# Patient Record
Sex: Male | Born: 1980 | Race: White | Hispanic: No | Marital: Married | Smoking: Never smoker
Health system: Southern US, Community
[De-identification: ages and names within clinical notes are randomized; demographics above are authoritative.]

## PROBLEM LIST (undated history)

## (undated) DIAGNOSIS — J302 Other seasonal allergic rhinitis: Secondary | ICD-10-CM

## (undated) DIAGNOSIS — I10 Essential (primary) hypertension: Secondary | ICD-10-CM

## (undated) DIAGNOSIS — F909 Attention-deficit hyperactivity disorder, unspecified type: Secondary | ICD-10-CM

## (undated) DIAGNOSIS — K219 Gastro-esophageal reflux disease without esophagitis: Secondary | ICD-10-CM

## (undated) DIAGNOSIS — G4730 Sleep apnea, unspecified: Secondary | ICD-10-CM

## (undated) HISTORY — PX: NO PAST SURGERIES: SHX2092

---

## 2019-08-10 DIAGNOSIS — Z6836 Body mass index (BMI) 36.0-36.9, adult: Secondary | ICD-10-CM | POA: Diagnosis not present

## 2019-08-10 DIAGNOSIS — R072 Precordial pain: Secondary | ICD-10-CM | POA: Diagnosis not present

## 2019-08-10 DIAGNOSIS — R030 Elevated blood-pressure reading, without diagnosis of hypertension: Secondary | ICD-10-CM | POA: Diagnosis not present

## 2019-08-10 DIAGNOSIS — Z23 Encounter for immunization: Secondary | ICD-10-CM | POA: Diagnosis not present

## 2019-08-10 DIAGNOSIS — Z0000 Encounter for general adult medical examination without abnormal findings: Secondary | ICD-10-CM | POA: Diagnosis not present

## 2020-03-12 ENCOUNTER — Other Ambulatory Visit: Payer: Self-pay

## 2020-03-12 NOTE — Telephone Encounter (Signed)
Pt lvm stating he needed a refill but did not state on what medication. Left message to call out office back or to contact his pharmacy so that they can notify us.

## 2020-03-15 ENCOUNTER — Other Ambulatory Visit: Payer: Self-pay

## 2020-03-15 ENCOUNTER — Ambulatory Visit: Payer: BC Managed Care – PPO | Admitting: Family Medicine

## 2020-03-15 ENCOUNTER — Encounter: Payer: Self-pay | Admitting: Family Medicine

## 2020-03-15 VITALS — BP 130/86 | HR 84 | Temp 97.5°F | Resp 18 | Ht 71.0 in | Wt 252.6 lb

## 2020-03-15 DIAGNOSIS — Z202 Contact with and (suspected) exposure to infections with a predominantly sexual mode of transmission: Secondary | ICD-10-CM | POA: Insufficient documentation

## 2020-03-15 MED ORDER — DOXYCYCLINE HYCLATE 100 MG PO TABS: 100.0000 mg | ORAL_TABLET | Freq: Two times a day (BID) | ORAL | 0 refills | Status: DC

## 2020-03-15 NOTE — Progress Notes (Signed)
Acute Office Visit  Subjective:    Patient ID: Brett Roberts, male    DOB: 09-05-1980, 39 y.o.   MRN: [id number]  Cc: pt has concerns for STD exposure  HPI Patient is in today for with h/o wife being diagnosed and treated for Chlamydia. Pt states wife previously was treated for chlamydia and he was tested twice-negative. pts wife was treated with antibiotics and she requested he be treated. Wife was told they could not have intercourse for 3 weeks after both received treatment. Pt states he has not experienced penile discharge, penile lesions, pain with urination or other concerns. pts only sexual partner is his wife.  No past medical history on file. No family history on file.  Social History   Socioeconomic History  . Marital status: Married    Spouse name: Not on file  . Number of children: Not on file  . Years of education: Not on file  . Highest education level: Not on file  Occupational History  . Not on file  Tobacco Use  . Smoking status: Never Smoker  Substance and Sexual Activity  . Alcohol use: Yes    Alcohol/week: 1.0 - 2.0 standard drink    Types: 1 - 2 Cans of beer per week    Comment: twice weekly   . Drug use: Never  . Sexual activity: Yes  Other Topics Concern  . Not on file  Social History Narrative  . Not on file   Social Determinants of Health   Financial Resource Strain:   . Difficulty of Paying Living Expenses:   Food Insecurity:   . Worried About Programme researcher, broadcasting/film/video in the Last Year:   . Barista in the Last Year:   Transportation Needs:   . Freight forwarder (Medical):   Marland Kitchen Lack of Transportation (Non-Medical):   Physical Activity:   . Days of Exercise per Week:   . Minutes of Exercise per Session:   Stress:   . Feeling of Stress :   Social Connections:   . Frequency of Communication with Friends and Family:   . Frequency of Social Gatherings with Friends and Family:   . Attends Religious Services:   . Active Member of  Clubs or Organizations:   . Attends Banker Meetings:   Marland Kitchen Marital Status:   Intimate Partner Violence:   . Fear of Current or Ex-Partner:   . Emotionally Abused:   Marland Kitchen Physically Abused:   . Sexually Abused:     No outpatient medications prior to visit.   No facility-administered medications prior to visit.    No Known Allergies  Review of Systems  Endocrine: Negative for polyuria.  Genitourinary: Negative for discharge, dysuria, flank pain, frequency and genital sores.       Objective:    Physical Exam Constitutional:      Appearance: Normal appearance.  Cardiovascular:     Rate and Rhythm: Normal rate and regular rhythm.     Pulses: Normal pulses.     Heart sounds: Normal heart sounds.  Abdominal:     General: Abdomen is flat.     Palpations: Abdomen is soft.  Neurological:     Mental Status: He is alert.  Psychiatric:        Mood and Affect: Mood normal.        Behavior: Behavior normal.     BP (!) 130/86   Pulse 84   Temp (!) 97.5 F (36.4 C)   Resp  18   Ht 5\' 11"  (1.803 m)   Wt (!) 252 lb 9.6 oz (114.6 kg)   BMI 35.23 kg/m  Wt Readings from Last 3 Encounters:  03/15/20 (!) 252 lb 9.6 oz (114.6 kg)    Health Maintenance Due  Topic Date Due  . Hepatitis C Screening  Never done  . COVID-19 Vaccine (1) Never done  . HIV Screening  Never done  . TETANUS/TDAP  Never done      Assessment & Plan:  1. Exposure to chlamydia-urine sample sent for testing GC/Chl Doxy-rx-d/w pt to complete treatment prior to resuming sexual intercourse Follow-up: if symptoms occur.  An After Visit Summary was printed and given to the patient.  03/17/20 [name] [phone]

## 2020-03-17 LAB — GC/CHLAMYDIA PROBE AMP
Chlamydia trachomatis, NAA: NEGATIVE
Neisseria Gonorrhoeae by PCR: NEGATIVE

## 2020-03-26 ENCOUNTER — Other Ambulatory Visit: Payer: Self-pay

## 2020-03-26 ENCOUNTER — Encounter: Payer: Self-pay | Admitting: Family Medicine

## 2020-03-26 ENCOUNTER — Ambulatory Visit (INDEPENDENT_AMBULATORY_CARE_PROVIDER_SITE_OTHER): Payer: BC Managed Care – PPO | Admitting: Family Medicine

## 2020-03-26 VITALS — BP 130/86 | HR 84 | Temp 97.2°F | Ht 71.0 in | Wt 251.0 lb

## 2020-03-26 DIAGNOSIS — K219 Gastro-esophageal reflux disease without esophagitis: Secondary | ICD-10-CM | POA: Diagnosis not present

## 2020-03-26 DIAGNOSIS — M65331 Trigger finger, right middle finger: Secondary | ICD-10-CM | POA: Diagnosis not present

## 2020-03-26 MED ORDER — TRIAMCINOLONE ACETONIDE 40 MG/ML IJ SUSP: 20.0000 mg | Freq: Once | INTRAMUSCULAR | Status: DC

## 2020-03-26 MED ORDER — OMEPRAZOLE 40 MG PO CPDR: 40.0000 mg | DELAYED_RELEASE_CAPSULE | Freq: Every day | ORAL | 1 refills | Status: DC

## 2020-03-26 NOTE — Patient Instructions (Addendum)
Do not eat 3 hours before going to bed Decrease caffeine intake   Food Choices for Gastroesophageal Reflux Disease, Adult When you have gastroesophageal reflux disease (GERD), the foods you eat and your eating habits are very important. Choosing the right foods can help ease your discomfort. Think about working with a nutrition specialist (dietitian) to help you make good choices. What are tips for following this plan?  Meals  Choose healthy foods that are low in fat, such as fruits, vegetables, whole grains, low-fat dairy products, and lean meat, fish, and poultry.  Eat small meals often instead of 3 large meals a day. Eat your meals slowly, and in a place where you are relaxed. Avoid bending over or lying down until 2-3 hours after eating.  Avoid eating meals 2-3 hours before bed.  Avoid drinking a lot of liquid with meals.  Cook foods using methods other than frying. Bake, grill, or broil food instead.  Avoid or limit: ? Chocolate. ? Peppermint or spearmint. ? Alcohol. ? Pepper. ? Black and decaffeinated coffee. ? Black and decaffeinated tea. ? Bubbly (carbonated) soft drinks. ? Caffeinated energy drinks and soft drinks.  Limit high-fat foods such as: ? Fatty meat or fried foods. ? Whole milk, cream, butter, or ice cream. ? Nuts and nut butters. ? Pastries, donuts, and sweets made with butter or shortening.  Avoid foods that cause symptoms. These foods may be different for everyone. Common foods that cause symptoms include: ? Tomatoes. ? Oranges, lemons, and limes. ? Peppers. ? Spicy food. ? Onions and garlic. ? Vinegar. Lifestyle  Maintain a healthy weight. Ask your doctor what weight is healthy for you. If you need to lose weight, work with your doctor to do so safely.  Exercise for at least 30 minutes for 5 or more days each week, or as told by your doctor.  Wear loose-fitting clothes.  Do not smoke. If you need help quitting, ask your doctor.  Sleep with  the head of your bed higher than your feet. Use a wedge under the mattress or blocks under the bed frame to raise the head of the bed. Summary  When you have gastroesophageal reflux disease (GERD), food and lifestyle choices are very important in easing your symptoms.  Eat small meals often instead of 3 large meals a day. Eat your meals slowly, and in a place where you are relaxed.  Limit high-fat foods such as fatty meat or fried foods.  Avoid bending over or lying down until 2-3 hours after eating.  Avoid peppermint and spearmint, caffeine, alcohol, and chocolate. This information is not intended to replace advice given to you by your health care provider. Make sure you discuss any questions you have with your health care provider. Document Revised: 12/02/2018 Document Reviewed: 09/16/2016 Elsevier Patient Education  2020 [employer].

## 2020-03-26 NOTE — Progress Notes (Signed)
Acute Office Visit  Subjective:    Patient ID: Brett Roberts, male    DOB: 08/10/1981, 39 y.o.   MRN: [id number]  Chief Complaint  Patient presents with  . Abdominal Pain    HPI Patient is in today for upper abdominal pain (sharp, shooting "gasy feeling" that radiates through his chest and down his left arm however, after he belches he feels relieved. Patient has had this issue before and was rx'd dexilant and took it for approx 2-3 weeks. He was unsure if it helped, but it resolved and had not flared up until last week. Pain comes and goes more during the night vs daytime.  Trigger finger 3rd finger of right hand c/o waking up at night with shooting pain down his wrist until he straightens his finger  History reviewed. No pertinent past medical history.  History reviewed. No pertinent surgical history.  Family History  Problem Relation Age of Onset  . Hypertension Mother   . Heart attack Father   . Hypertension Sister   . Hyperlipidemia Brother     Social History   Socioeconomic History  . Marital status: Married    Spouse name: Not on file  . Number of children: Not on file  . Years of education: Not on file  . Highest education level: Not on file  Occupational History  . Not on file  Tobacco Use  . Smoking status: Never Smoker  . Smokeless tobacco: Never Used  Substance and Sexual Activity  . Alcohol use: Yes    Alcohol/week: 1.0 - 2.0 standard drink    Types: 1 - 2 Cans of beer per week    Comment: twice weekly   . Drug use: Never  . Sexual activity: Yes  Other Topics Concern  . Not on file  Social History Narrative  . Not on file   Social Determinants of Health   Financial Resource Strain:   . Difficulty of Paying Living Expenses:   Food Insecurity:   . Worried About Charity fundraiser in the Last Year:   . Arboriculturist in the Last Year:   Transportation Needs:   . Film/video editor (Medical):   Marland Kitchen Lack of Transportation (Non-Medical):     Physical Activity:   . Days of Exercise per Week:   . Minutes of Exercise per Session:   Stress:   . Feeling of Stress :   Social Connections:   . Frequency of Communication with Friends and Family:   . Frequency of Social Gatherings with Friends and Family:   . Attends Religious Services:   . Active Member of Clubs or Organizations:   . Attends Archivist Meetings:   Marland Kitchen Marital Status:   Intimate Partner Violence:   . Fear of Current or Ex-Partner:   . Emotionally Abused:   Marland Kitchen Physically Abused:   . Sexually Abused:     Outpatient Medications Prior to Visit  Medication Sig Dispense Refill  . doxycycline (VIBRA-TABS) 100 MG tablet Take 1 tablet (100 mg total) by mouth 2 (two) times daily. 14 tablet 0   No facility-administered medications prior to visit.    No Known Allergies  Review of Systems  Constitutional: Negative for chills and fever.  HENT: Negative for congestion, ear pain and sore throat.   Respiratory: Negative for shortness of breath.   Cardiovascular: Negative for chest pain.  Gastrointestinal: Positive for abdominal pain. Negative for constipation, diarrhea, nausea and vomiting.  Objective:    Physical Exam Vitals reviewed.  Constitutional:      Appearance: Normal appearance. He is normal weight.  Cardiovascular:     Rate and Rhythm: Normal rate and regular rhythm.  Pulmonary:     Effort: Pulmonary effort is normal.     Breath sounds: Normal breath sounds.  Abdominal:     General: Abdomen is flat. Bowel sounds are normal.     Palpations: Abdomen is soft.     Tenderness: There is abdominal tenderness in the left upper quadrant.  Musculoskeletal:     Comments: Right 3rd digit triggers.   Neurological:     Mental Status: He is alert and oriented to person, place, and time.  Psychiatric:        Mood and Affect: Mood normal.        Behavior: Behavior normal.     BP 130/86   Pulse 84   Temp (!) 97.2 F (36.2 C)   Ht '5\' 11"'   (1.803 m)   Wt 251 lb (113.9 kg)   SpO2 99%   BMI 35.01 kg/m  Wt Readings from Last 3 Encounters:  03/26/20 251 lb (113.9 kg)  03/15/20 (!) 252 lb 9.6 oz (114.6 kg)    Health Maintenance Due  Topic Date Due  . Hepatitis C Screening  Never done  . COVID-19 Vaccine (1) Never done  . HIV Screening  Never done  . TETANUS/TDAP  Never done  . INFLUENZA VACCINE  03/25/2020    There are no preventive care reminders to display for this patient.   No results found for: TSH No results found for: WBC, HGB, HCT, MCV, PLT No results found for: NA, K, CHLORIDE, CO2, GLUCOSE, BUN, CREATININE, BILITOT, ALKPHOS, AST, ALT, PROT, ALBUMIN, CALCIUM, ANIONGAP, EGFR, GFR No results found for: CHOL No results found for: HDL No results found for: LDLCALC No results found for: TRIG No results found for: CHOLHDL No results found for: HGBA1C     Assessment & Plan:  1. Gastroesophageal reflux disease without esophagitis Start omeprazole. Do not eat 3 hours before going to bed Decrease caffeine intake - omeprazole (PRILOSEC) 40 MG capsule; Take 1 capsule (40 mg total) by mouth daily.  Dispense: 30 capsule; Refill: 1  2. Trigger middle finger of right hand - triamcinolone acetonide (KENALOG-40) injection 20 mg Risks were discussed including bleeding, infection, increase in sugars if diabetic, atrophy at site of injection, and increased pain.  After consent was obtained, using sterile technique the area was prepped with alcohol.  Kenalog 20 mg and 0.5 ml plain Lidocaine was then injected and the needle withdrawn.  The procedure was well tolerated.   The patient is asked to continue to rest the joint for a few more days before resuming regular activities.  It may be more painful for the first 1-2 days.  Watch for fever, or increased swelling or persistent pain in the joint. Call or return to clinic prn if such symptoms occur or there is failure to improve as anticipated.  Meds ordered this encounter   Medications  . omeprazole (PRILOSEC) 40 MG capsule    Sig: Take 1 capsule (40 mg total) by mouth daily.    Dispense:  30 capsule    Refill:  1  . triamcinolone acetonide (KENALOG-40) injection 20 mg     Follow-up: Return if symptoms worsen or fail to improve.  An After Visit Summary was printed and given to the patient.  Rochel Brome [facility] [phone]

## 2020-04-01 ENCOUNTER — Encounter: Payer: Self-pay | Admitting: Family Medicine

## 2020-04-21 ENCOUNTER — Encounter (HOSPITAL_COMMUNITY): Payer: Self-pay | Admitting: Emergency Medicine

## 2020-04-21 ENCOUNTER — Emergency Department (HOSPITAL_COMMUNITY)
Admission: EM | Admit: 2020-04-21 | Discharge: 2020-04-21 | Disposition: A | Discharge status: Home / Self Care | Payer: BC Managed Care – PPO | Attending: Emergency Medicine | Admitting: Emergency Medicine

## 2020-04-21 ENCOUNTER — Emergency Department (HOSPITAL_COMMUNITY): Payer: BC Managed Care – PPO

## 2020-04-21 ENCOUNTER — Other Ambulatory Visit: Payer: Self-pay

## 2020-04-21 DIAGNOSIS — R0789 Other chest pain: Secondary | ICD-10-CM | POA: Diagnosis not present

## 2020-04-21 DIAGNOSIS — I2510 Atherosclerotic heart disease of native coronary artery without angina pectoris: Secondary | ICD-10-CM | POA: Diagnosis not present

## 2020-04-21 DIAGNOSIS — R072 Precordial pain: Secondary | ICD-10-CM | POA: Diagnosis not present

## 2020-04-21 DIAGNOSIS — Z79899 Other long term (current) drug therapy: Secondary | ICD-10-CM | POA: Diagnosis not present

## 2020-04-21 HISTORY — DX: Gastro-esophageal reflux disease without esophagitis: K21.9

## 2020-04-21 LAB — CBC
HCT: 42.9 % (ref 39.0–52.0)
Hemoglobin: 13.7 g/dL (ref 13.0–17.0)
MCH: 29 pg (ref 26.0–34.0)
MCHC: 31.9 g/dL (ref 30.0–36.0)
MCV: 90.7 fL (ref 80.0–100.0)
Platelets: 254 10*3/uL (ref 150–400)
RBC: 4.73 MIL/uL (ref 4.22–5.81)
RDW: 13.3 % (ref 11.5–15.5)
WBC: 5.7 10*3/uL (ref 4.0–10.5)
nRBC: 0 % (ref 0.0–0.2)

## 2020-04-21 LAB — BASIC METABOLIC PANEL
Anion gap: 7 (ref 5–15)
BUN: 16 mg/dL (ref 6–20)
CO2: 26 mmol/L (ref 22–32)
Calcium: 8.8 mg/dL — ABNORMAL LOW (ref 8.9–10.3)
Chloride: 105 mmol/L (ref 98–111)
Creatinine, Ser: 1.06 mg/dL (ref 0.61–1.24)
GFR calc Af Amer: >60 mL/min (ref >60)
GFR calc non Af Amer: >60 mL/min (ref >60)
Glucose, Bld: 107 mg/dL — ABNORMAL HIGH (ref 70–99)
Potassium: 4.6 mmol/L (ref 3.5–5.1)
Sodium: 138 mmol/L (ref 135–145)

## 2020-04-21 LAB — TROPONIN I (HIGH SENSITIVITY)
Troponin I (High Sensitivity): 3 ng/L (ref <18)
Troponin I (High Sensitivity): 3 ng/L (ref <18)

## 2020-04-21 MED ORDER — ALUM & MAG HYDROXIDE-SIMETH 200-200-20 MG/5ML PO SUSP
30.0000 mL | Freq: Once | ORAL | Status: AC
  Administered 2020-04-21: 30 mL via ORAL
  Filled 2020-04-21: qty 30

## 2020-04-21 MED ORDER — IBUPROFEN 400 MG PO TABS
400.0000 mg | ORAL_TABLET | ORAL | Status: AC
  Administered 2020-04-21: 400 mg via ORAL
  Filled 2020-04-21: qty 1

## 2020-04-21 MED ORDER — LIDOCAINE VISCOUS HCL 2 % MT SOLN
15.0000 mL | Freq: Once | OROMUCOSAL | Status: AC
  Administered 2020-04-21: 15 mL via ORAL
  Filled 2020-04-21: qty 15

## 2020-04-21 NOTE — ED Triage Notes (Signed)
C/o "discomfort" and "tenderness" to center and L chest since waking up this morning.  Denies SOB, nausea, and vomiting.

## 2020-04-21 NOTE — ED Provider Notes (Signed)
[facility] EMERGENCY DEPARTMENT Provider Note   CSN: [id number] Arrival date & time: 04/21/20  1107     History Chief Complaint  Patient presents with  . Chest Pain    Brett Roberts is a 39 y.o. male medical's history significant for obesity, strong family history for coronary artery disease present today for chest pain.  Patient dates that this is going off and on for as long as a week but has been a little worse this morning which was concerning for him.  Describes chest pain is sharp, left-sided, occasionally radiating into his left shoulder.  Worsened with movement.  No effective exertion or position.  No associated nausea.  Patient has been attempting antacids without improvement.  The history is provided by the patient.  Chest Pain Pain location:  L chest Pain quality: sharp   Pain radiates to:  L shoulder Pain severity:  Moderate Onset quality:  Gradual Duration:  1 day Timing:  Constant Progression:  Waxing and waning Chronicity:  New Context: breathing and movement   Relieved by:  Nothing Associated symptoms: no abdominal pain, no cough, no dizziness, no fever, no headache, no palpitations, no shortness of breath and no vomiting     HPI: A 39 year old patient with a history of obesity presents for evaluation of chest pain. Initial onset of pain was approximately 3-6 hours ago. The patient's chest pain is sharp and is not worse with exertion. The patient's chest pain is middle- or left-sided, is not well-localized, is not described as heaviness/pressure/tightness and does radiate to the arms/jaw/neck. The patient does not complain of nausea and denies diaphoresis. The patient has a family history of coronary artery disease in a first-degree relative with onset less than age 41. The patient has no history of stroke, has no history of peripheral artery disease, has not smoked in the past 90 days, denies any history of treated diabetes, is not hypertensive and  has no history of hypercholesterolemia.   Past Medical History:  Diagnosis Date  . Acid reflux     Patient Active Problem List   Diagnosis Date Noted  . Exposure to chlamydia 03/15/2020    History reviewed. No pertinent surgical history.     Family History  Problem Relation Age of Onset  . Hypertension Mother   . Heart attack Father   . Hypertension Sister   . Hyperlipidemia Brother     Social History   Tobacco Use  . Smoking status: Never Smoker  . Smokeless tobacco: Never Used  Substance Use Topics  . Alcohol use: Yes    Alcohol/week: 1.0 - 2.0 standard drink    Types: 1 - 2 Cans of beer per week    Comment: twice weekly   . Drug use: Never    Home Medications Prior to Admission medications   Medication Sig Start Date End Date Taking? Authorizing Provider  acetaminophen (TYLENOL) 500 MG tablet Take 1,000 mg by mouth every 6 (six) hours as needed for headache (pain).   Yes [provider]  ibuprofen (ADVIL) 200 MG tablet Take 400 mg by mouth every 6 (six) hours as needed for headache (pain).   Yes [provider]  naproxen sodium (ALEVE) 220 MG tablet Take 220 mg by mouth daily as needed (pain/headache).   Yes [provider]  omeprazole (PRILOSEC) 40 MG capsule Take 1 capsule (40 mg total) by mouth daily. 03/26/20  Yes [facility] Mand[facility], MD    Allergies    Patient has no known  allergies.  Review of Systems   Review of Systems  Constitutional: Negative for chills and fever.  HENT: Negative for facial swelling and voice change.   Eyes: Negative for redness and visual disturbance.  Respiratory: Negative for cough and shortness of breath.   Cardiovascular: Positive for chest pain. Negative for palpitations.  Gastrointestinal: Negative for abdominal pain and vomiting.  Genitourinary: Negative for difficulty urinating and dysuria.  Musculoskeletal: Negative for gait problem and joint swelling.  Skin: Negative for rash and wound.    Neurological: Negative for dizziness and headaches.  Psychiatric/Behavioral: Negative for confusion and suicidal ideas.    Physical Exam Updated Vital Signs BP 125/75   Pulse 61   Temp 98.6 F (37 C) (Oral)   Resp 12   Ht 5\' 11"  (1.803 m)   Wt 111.1 kg   SpO2 100%   BMI 34.17 kg/m   Physical Exam Constitutional:      General: He is not in acute distress. HENT:     Head: Normocephalic and atraumatic.     Mouth/Throat:     Mouth: Mucous membranes are moist.     Pharynx: Oropharynx is clear.  Eyes:     General: No scleral icterus.    Pupils: Pupils are equal, round, and reactive to light.  Cardiovascular:     Rate and Rhythm: Normal rate and regular rhythm.     Pulses: Normal pulses.          Carotid pulses are 2+ on the right side and 2+ on the left side.      Radial pulses are 2+ on the right side and 2+ on the left side.       Dorsalis pedis pulses are 2+ on the right side and 2+ on the left side.       Posterior tibial pulses are 2+ on the right side and 2+ on the left side.     Comments: Chest pain reproducible on palpation of the left hemithorax, no appreciable chest wall instability Pulmonary:     Effort: Pulmonary effort is normal. No respiratory distress.  Abdominal:     General: There is no distension.     Tenderness: There is no abdominal tenderness.  Musculoskeletal:        General: No tenderness or deformity.     Cervical back: Normal range of motion and neck supple.  Neurological:     General: No focal deficit present.     Mental Status: He is alert and oriented to person, place, and time.  Psychiatric:        Mood and Affect: Mood normal.        Behavior: Behavior normal.     ED Results / Procedures / Treatments   Labs (all labs ordered are listed, but only abnormal results are displayed) Labs Reviewed  BASIC METABOLIC PANEL - Abnormal; Notable for the following components:      Result Value   Glucose, Bld 107 (*)    Calcium 8.8 (*)    All  other components within normal limits  CBC  TROPONIN I (HIGH SENSITIVITY)  TROPONIN I (HIGH SENSITIVITY)    EKG EKG Interpretation  Date/Time:  Saturday April 21 2020 11:27:43 EDT Ventricular Rate:  62 PR Interval:  226 QRS Duration: 80 QT Interval:  382 QTC Calculation: 387 R Axis:   28 Text Interpretation: Sinus rhythm with 1st degree A-V block Otherwise normal ECG No old tracing to compare Confirmed by 12-31-2000 [phone]) on 04/21/2020 3:43:13 PM  Radiology DG Chest 2 View  Result Date: 04/21/2020 CLINICAL DATA:  Central to LEFT chest discomfort and tenderness since waking this morning EXAM: CHEST - 2 VIEW COMPARISON:  None FINDINGS: Upper normal heart size. Mediastinal contours and pulmonary vascularity normal. Lungs clear. No infiltrate, pleural effusion, or pneumothorax. Osseous structures unremarkable. IMPRESSION: No acute abnormalities. Electronically Signed   By: Ulyses SouthwardMark  Boles M.D.   On: 04/21/2020 12:44    Procedures Procedures (including critical care time)  Medications Ordered in ED Medications  alum & mag hydroxide-simeth (MAALOX/MYLANTA) 200-200-20 MG/5ML suspension 30 mL (30 mLs Oral Given 04/21/20 1645)    And  lidocaine (XYLOCAINE) 2 % viscous mouth solution 15 mL (15 mLs Oral Given 04/21/20 1648)  ibuprofen (ADVIL) tablet 400 mg (400 mg Oral Given 04/21/20 1645)    ED Course  I have reviewed the triage vital signs and the nursing notes.  Pertinent labs & imaging results that were available during my care of the patient were reviewed by me and considered in my medical decision making (see chart for details).  Clinical Course as of Apr 21 1908  Sat Apr 21, 2020  1721 Troponin I (High Sensitivity): 3 [JR]    Clinical Course User Index [JR] Loree Feeedding, Sandra Brents, MD   MDM Rules/Calculators/A&P HEAR Score: 1                        Possible differential diagnoses I considered included:  ACS, PNA, PE, Aortic Dissection, Pancreatitis, Arrhythmia,  Pneumothorax, Endo/Myo/Pericarditis, Shingles, PUD, Emergent complications of an Ulcer, Esophageal pathology, pleuritis, musculoskeletal, and anxiety.    Workup:  EKG findings by my read: Compared to prior: None.  Rate: Sixty-two rhythm: sinus Axis: appropriate  PR: 226 QRS: 80QTc: 387.  First-degree AV block, otherwise no evidence of ischemia or arrhythmia, nor any other pathologic findings concerning considering patient presentation. Findings discussed with attending who agrees.  CXR showed no acute findings.  Initial troponin 3.   HEART pathway: Not excluded HEAR score of one, low risk.  PERC: Zero  Thought process: Unlikely PE, no hypoxia, cough PERC score of zero. Unlikely pneumonia, no cough, no leukocytosis, no fevers, CXR and exam without acute findings. Unlikely pneumothorax, no findings on  CXR. Unlikely pericarditis/myocarditis, does not fit clinical picture, no EKG findings to support. Unlikely dissection, not severe at onset, not sharp or tearing, no radiation to back, no neurologic complaints. Esophageal pathology and anxiety remain diagnoses of exclusion at this time. Due to patient's high risk will attempt to rule out ACS, the low suspicion based on atypical presentation and low here score.   We will administer GI cocktail as well as ibuprofen, suspect musculoskeletal nature of pain given reproducibility on palpation of the chest  Repeat troponin 3, reassuring  After GI cocktail, patient symptomatically improved  The plan for this patient was discussed with Dr. Particia NearingHaviland who voiced agreement and who oversaw evaluation and treatment of this patient.   Counseled follow-up with PCP in the next week and expressed understanding and agreement.  Impression: On re-evaluation patients VS were stable  I discussed all relevant finding and clinical impression to the patient. The patient expressed understanding of their condition as well as the disposition proposed and expressed  agreement.  Clinical Impression: 1. Precordial pain      Final Clinical Impression(s) / ED Diagnoses Final diagnoses:  Precordial pain    Rx / DC Orders ED Discharge Orders    None     Labs, studies and  imaging reviewed by myself and considered in medical decision making if ordered. Imaging interpreted by radiology. Pt was discussed with my attending, Dr. Particia Nearing  Electronically signed by:  Christiane Ha Redding8/28/20217:09 PM       Loree Fee, MD 04/21/20 Robie Ridge, MD 04/21/20 2040

## 2020-08-21 ENCOUNTER — Telehealth (INDEPENDENT_AMBULATORY_CARE_PROVIDER_SITE_OTHER): Payer: BC Managed Care – PPO | Admitting: Nurse Practitioner

## 2020-08-21 ENCOUNTER — Encounter: Payer: Self-pay | Admitting: Nurse Practitioner

## 2020-08-21 VITALS — Ht 71.0 in | Wt 251.0 lb

## 2020-08-21 DIAGNOSIS — R509 Fever, unspecified: Secondary | ICD-10-CM

## 2020-08-21 DIAGNOSIS — J309 Allergic rhinitis, unspecified: Secondary | ICD-10-CM

## 2020-08-21 DIAGNOSIS — Z6835 Body mass index (BMI) 35.0-35.9, adult: Secondary | ICD-10-CM

## 2020-08-21 DIAGNOSIS — J029 Acute pharyngitis, unspecified: Secondary | ICD-10-CM

## 2020-08-21 DIAGNOSIS — J018 Other acute sinusitis: Secondary | ICD-10-CM

## 2020-08-21 LAB — POC COVID19 BINAXNOW: SARS Coronavirus 2 Ag: NEGATIVE

## 2020-08-21 LAB — POCT RAPID STREP A (OFFICE): Rapid Strep A Screen: NEGATIVE

## 2020-08-21 MED ORDER — FLUTICASONE PROPIONATE 50 MCG/ACT NA SUSP: 2.0000 | Freq: Every day | NASAL | 6 refills | Status: DC

## 2020-08-21 MED ORDER — AMOXICILLIN 875 MG PO TABS: 875.0000 mg | ORAL_TABLET | Freq: Two times a day (BID) | ORAL | 0 refills | Status: DC

## 2020-08-21 NOTE — Progress Notes (Signed)
Virtual Visit via Telephone Note   This visit type was conducted due to national recommendations for restrictions regarding the COVID-19 Pandemic (e.g. social distancing) in an effort to limit this patient's exposure and mitigate transmission in our community.  Due to his co-morbid illnesses, this patient is at least at moderate risk for complications without adequate follow up.  This format is felt to be most appropriate for this patient at this time.  The patient did not have access to video technology/had technical difficulties with video requiring transitioning to audio format only (telephone).  All issues noted in this document were discussed and addressed.  No physical exam could be performed with this format.  Patient verbally consented to a telehealth visit.   Date:  08/21/2020   ID:  Brett Roberts, DOB 1981-04-30, MRN [id number]  Patient Location: Home Provider Location: Office/Clinic  PCP:  Blane Ohara, MD   Evaluation Performed:  Established patient, acute telemedicine visit  Chief Complaint: Sinus congestion  History of Present Illness:    Brett Roberts is a 39 y.o. male with 3-day onset of sinus congestion, pressure, pain, fever, and sore throat. Pt states he has bilateral ear fullness, post-nasal-drip and fatigue. Treatment has included Sudafed OTC, Ibuprofen, hot tea, and pushing fluids. He has received COVID-19 vaccinations and booster. He has also had seasonal flu vaccine. Past medical history includes GERD.   The patient does have symptoms concerning for COVID-19 infection (fever, chills, cough, or new shortness of breath).    Past Medical History:  Diagnosis Date  . Acid reflux     History reviewed. No pertinent surgical history.  Family History  Problem Relation Age of Onset  . Hypertension Mother   . Heart attack Father   . Hypertension Sister   . Hyperlipidemia Brother     Social History   Socioeconomic History  . Marital status: Married    Spouse  name: Not on file  . Number of children: Not on file  . Years of education: Not on file  . Highest education level: Not on file  Occupational History  . Not on file  Tobacco Use  . Smoking status: Never Smoker  . Smokeless tobacco: Never Used  Substance and Sexual Activity  . Alcohol use: Yes    Alcohol/week: 1.0 - 2.0 standard drink    Types: 1 - 2 Cans of beer per week    Comment: twice weekly   . Drug use: Never  . Sexual activity: Yes  Other Topics Concern  . Not on file  Social History Narrative  . Not on file   Social Determinants of Health   Financial Resource Strain: Not on file  Food Insecurity: Not on file  Transportation Needs: Not on file  Physical Activity: Not on file  Stress: Not on file  Social Connections: Not on file  Intimate Partner Violence: Not on file    Outpatient Medications Prior to Visit  Medication Sig Dispense Refill  . omeprazole (PRILOSEC) 40 MG capsule Take 1 capsule (40 mg total) by mouth daily. 30 capsule 1  . acetaminophen (TYLENOL) 500 MG tablet Take 1,000 mg by mouth every 6 (six) hours as needed for headache (pain).    Marland Kitchen ibuprofen (ADVIL) 200 MG tablet Take 400 mg by mouth every 6 (six) hours as needed for headache (pain).    . naproxen sodium (ALEVE) 220 MG tablet Take 220 mg by mouth daily as needed (pain/headache).    . triamcinolone acetonide (KENALOG-40) injection 20 mg  No facility-administered medications prior to visit.    Allergies:   Patient has no known allergies.   Social History   Tobacco Use  . Smoking status: Never Smoker  . Smokeless tobacco: Never Used  Substance Use Topics  . Alcohol use: Yes    Alcohol/week: 1.0 - 2.0 standard drink    Types: 1 - 2 Cans of beer per week    Comment: twice weekly   . Drug use: Never     Review of Systems  Constitutional: Negative for chills, fever and malaise/fatigue.  HENT: Positive for congestion, sinus pain and sore throat. Negative for ear pain.   Respiratory:  Negative for cough, sputum production, shortness of breath and wheezing.   Cardiovascular: Negative for chest pain.  Gastrointestinal: Negative for abdominal pain, constipation, diarrhea, nausea and vomiting.  Genitourinary: Negative for frequency and urgency.  Musculoskeletal: Negative for back pain, joint pain and myalgias.  Neurological: Positive for headaches (sinus). Negative for dizziness.     Labs/Other Tests and Data Reviewed:    Recent Labs: 04/21/2020: BUN 16; Creatinine, Ser 1.06; Hemoglobin 13.7; Platelets 254; Potassium 4.6; Sodium 138   Recent Lipid Panel No results found for: CHOL, TRIG, HDL, CHOLHDL, LDLCALC, LDLDIRECT  Wt Readings from Last 3 Encounters:  08/21/20 251 lb (113.9 kg)  04/21/20 245 lb (111.1 kg)  03/26/20 251 lb (113.9 kg)     Objective:    Vital Signs:  Ht 5\' 11"  (1.803 m)   Wt 251 lb (113.9 kg)   BMI 35.01 kg/m    Physical Exam Vitals reviewed.    No physical exam due to telemedicine visit  ASSESSMENT & PLAN:    1. Fever, unspecified fever cause - POC COVID-19 - POCT rapid strep A  2. Sore throat - POC COVID-19 - POCT rapid strep A  3. Acute non-recurrent sinusitis of other sinus - amoxicillin (AMOXIL) 875 MG tablet; Take 1 tablet (875 mg total) by mouth 2 (two) times daily.  Dispense: 20 tablet; Refill: 0  4. Chronic allergic rhinitis - fluticasone (FLONASE) 50 MCG/ACT nasal spray; Place 2 sprays into both nostrils daily.  Dispense: 16 g; Refill: 6  5. Pharyngitis, unspecified etiology - amoxicillin (AMOXIL) 875 MG tablet; Take 1 tablet (875 mg total) by mouth 2 (two) times daily.  Dispense: 20 tablet; Refill: 0  6. BMI 35.0-35.9,adult    COVID-19 Education: The signs and symptoms of COVID-19 were discussed with the patient and how to seek care for testing (follow up with PCP or arrange E-visit). The importance of social distancing was discussed today.   I spent  20 minutes dedicated to the care of this patient on the date  of this encounter to include telephone time with the patient, as well as:EMR review and prescription medication management.  Follow Up:  Virtual Visit  prn  Signed,  02-09-1979, DNP  08/21/2020 15:18    [name]

## 2020-08-23 ENCOUNTER — Ambulatory Visit (INDEPENDENT_AMBULATORY_CARE_PROVIDER_SITE_OTHER): Payer: BC Managed Care – PPO

## 2020-08-23 DIAGNOSIS — Z20822 Contact with and (suspected) exposure to covid-19: Secondary | ICD-10-CM | POA: Diagnosis not present

## 2020-08-23 DIAGNOSIS — Z1152 Encounter for screening for COVID-19: Secondary | ICD-10-CM

## 2020-08-23 LAB — POC COVID19 BINAXNOW: SARS Coronavirus 2 Ag: NEGATIVE

## 2020-08-23 NOTE — Progress Notes (Signed)
      Patient was seen at our office today for a COVID test required for travel. He is negative.  Results for orders placed or performed in visit on 08/23/20 (from the past 24 hour(s))  POC COVID-19     Status: Normal   Collection Time: 08/23/20  9:21 AM  Result Value Ref Range   SARS Coronavirus 2 Ag Negative Negative    Creola Corn, LPN 03/55/97 4:16 AM

## 2020-09-23 ENCOUNTER — Other Ambulatory Visit: Payer: Self-pay | Admitting: Family Medicine

## 2020-09-23 DIAGNOSIS — K219 Gastro-esophageal reflux disease without esophagitis: Secondary | ICD-10-CM

## 2020-10-14 DIAGNOSIS — Z20822 Contact with and (suspected) exposure to covid-19: Secondary | ICD-10-CM | POA: Diagnosis not present

## 2020-11-19 ENCOUNTER — Other Ambulatory Visit: Payer: Self-pay

## 2020-11-19 ENCOUNTER — Encounter: Payer: Self-pay | Admitting: Legal Medicine

## 2020-11-19 ENCOUNTER — Ambulatory Visit: Payer: BC Managed Care – PPO | Admitting: Legal Medicine

## 2020-11-19 DIAGNOSIS — M65331 Trigger finger, right middle finger: Secondary | ICD-10-CM | POA: Diagnosis not present

## 2020-11-19 DIAGNOSIS — M65339 Trigger finger, unspecified middle finger: Secondary | ICD-10-CM

## 2020-11-19 DIAGNOSIS — M6530 Trigger finger, unspecified finger: Secondary | ICD-10-CM | POA: Insufficient documentation

## 2020-11-19 HISTORY — DX: Trigger finger, unspecified middle finger: M65.339

## 2020-11-19 NOTE — Progress Notes (Signed)
Subjective:  Patient ID: Brett Roberts, male    DOB: 10/01/1980  Age: 40 y.o. MRN: [id number]  Chief Complaint  Patient presents with  . trigger finger    R middle finger; States finger locks up and is painful in his palm. States he takes ibuprofen for the pain.    HPI: recurrent right middle finger triggering and at times locked down.  It was injected 6 months ago and did well until recently. He wears a brace at night the the finger becoms trapped in flexion if he doesn't   Current Outpatient Medications on File Prior to Visit  Medication Sig Dispense Refill  . fluticasone (FLONASE) 50 MCG/ACT nasal spray Place 2 sprays into both nostrils daily. 16 g 6  . omeprazole (PRILOSEC) 40 MG capsule Take 1 capsule (40 mg total) by mouth daily. 30 capsule 1   No current facility-administered medications on file prior to visit.   Past Medical History:  Diagnosis Date  . Acid reflux    History reviewed. No pertinent surgical history.  Family History  Problem Relation Age of Onset  . Hypertension Mother   . Heart attack Father   . Hypertension Sister   . Hyperlipidemia Brother    Social History   Socioeconomic History  . Marital status: Married    Spouse name: Not on file  . Number of children: Not on file  . Years of education: Not on file  . Highest education level: Not on file  Occupational History  . Not on file  Tobacco Use  . Smoking status: Never Smoker  . Smokeless tobacco: Never Used  Substance and Sexual Activity  . Alcohol use: Yes    Alcohol/week: 1.0 - 2.0 standard drink    Types: 1 - 2 Cans of beer per week    Comment: twice weekly   . Drug use: Never  . Sexual activity: Yes  Other Topics Concern  . Not on file  Social History Narrative  . Not on file   Social Determinants of Health   Financial Resource Strain: Not on file  Food Insecurity: Not on file  Transportation Needs: Not on file  Physical Activity: Not on file  Stress: Not on file  Social  Connections: Not on file    Review of Systems  Constitutional: Negative for activity change and appetite change.  HENT: Negative for congestion, sinus pressure and sinus pain.   Eyes: Negative for visual disturbance.  Respiratory: Negative for chest tightness and shortness of breath.   Cardiovascular: Negative for chest pain, palpitations and leg swelling.  Gastrointestinal: Negative for abdominal distention and abdominal pain.  Endocrine: Negative for polyuria.  Genitourinary: Negative for dysuria and urgency.  Musculoskeletal: Negative for arthralgias and back pain.  Skin: Negative.   Neurological: Negative.   Psychiatric/Behavioral: Negative.      Objective:  BP 140/88 (BP Location: Left Arm, Patient Position: Sitting, Cuff Size: Normal)   Pulse 92   Temp 97.8 F (36.6 C) (Temporal)   Resp 16   Ht 5\' 11"  (1.803 m)   Wt 267 lb 9.6 oz (121.4 kg)   SpO2 98%   BMI 37.32 kg/m   BP/Weight 11/19/2020 08/21/2020 04/21/2020  Systolic BP 140 - 125  Diastolic BP 88 - 75  Wt. (Lbs) 267.6 251 245  BMI 37.32 35.01 34.17    Physical Exam Vitals reviewed.  Constitutional:      Appearance: Normal appearance.  Cardiovascular:     Rate and Rhythm: Normal rate and regular  rhythm.     Pulses: Normal pulses.     Heart sounds: Normal heart sounds. No murmur heard. No gallop.   Pulmonary:     Effort: Pulmonary effort is normal. No respiratory distress.     Breath sounds: Normal breath sounds. No rales.  Abdominal:     General: Abdomen is flat. Bowel sounds are normal.     Palpations: Abdomen is soft.  Musculoskeletal:        General: Normal range of motion.     Cervical back: Normal range of motion and neck supple.     Comments: Trigger finger right 3rd finger.  Skin:    General: Skin is warm and dry.     Capillary Refill: Capillary refill takes less than 2 seconds.  Neurological:     General: No focal deficit present.     Mental Status: He is alert and oriented to person,  place, and time.       Lab Results  Component Value Date   WBC 5.7 04/21/2020   HGB 13.7 04/21/2020   HCT 42.9 04/21/2020   PLT 254 04/21/2020   GLUCOSE 107 (H) 04/21/2020   NA 138 04/21/2020   K 4.6 04/21/2020   CL 105 04/21/2020   CREATININE 1.06 04/21/2020   BUN 16 04/21/2020   CO2 26 04/21/2020      Assessment & Plan:  Diagnoses and all orders for this visit: Trigger middle finger of right hand Patient has trigger finger right middle finger.  It is painful and can stick in flexion at times.  We discussed injection and possible surgery to correct.  Procedure: Informed consent obtained.  The area was cleaned and dressed.  1% xylocaine 0.5cc and 20mg  kenalog injected into flexor tendon sheath without tcomplication.  No blood loss.  No complications.       I spent 20 minutes dedicated to the care of this patient on the date of this encounter to include face-to-face time with the patient, as well as:   Follow-up: Return if symptoms worsen or fail to improve.  An After Visit Summary was printed and given to the patient.  , MD [name] [phone]

## 2021-01-04 ENCOUNTER — Other Ambulatory Visit: Payer: Self-pay

## 2021-01-04 ENCOUNTER — Ambulatory Visit: Payer: BC Managed Care – PPO | Admitting: Nurse Practitioner

## 2021-01-04 VITALS — BP 130/80 | HR 71 | Temp 97.3°F | Ht 71.0 in | Wt 264.0 lb

## 2021-01-04 DIAGNOSIS — I10 Essential (primary) hypertension: Secondary | ICD-10-CM | POA: Diagnosis not present

## 2021-01-04 DIAGNOSIS — R030 Elevated blood-pressure reading, without diagnosis of hypertension: Secondary | ICD-10-CM | POA: Diagnosis not present

## 2021-01-04 DIAGNOSIS — R079 Chest pain, unspecified: Secondary | ICD-10-CM | POA: Diagnosis not present

## 2021-01-04 DIAGNOSIS — R10816 Epigastric abdominal tenderness: Secondary | ICD-10-CM

## 2021-01-04 DIAGNOSIS — K219 Gastro-esophageal reflux disease without esophagitis: Secondary | ICD-10-CM

## 2021-01-04 DIAGNOSIS — Z6836 Body mass index (BMI) 36.0-36.9, adult: Secondary | ICD-10-CM

## 2021-01-04 DIAGNOSIS — Z8249 Family history of ischemic heart disease and other diseases of the circulatory system: Secondary | ICD-10-CM

## 2021-01-04 MED ORDER — HYDROCHLOROTHIAZIDE 12.5 MG PO TABS: 12.5000 mg | ORAL_TABLET | Freq: Every day | ORAL | 3 refills | Status: DC

## 2021-01-04 MED ORDER — ESOMEPRAZOLE MAGNESIUM 40 MG PO CPDR: 40.0000 mg | DELAYED_RELEASE_CAPSULE | Freq: Two times a day (BID) | ORAL | 3 refills | Status: DC

## 2021-01-04 NOTE — Progress Notes (Signed)
Subjective:  Patient ID: Brett Roberts, male    DOB: [birth year]  Age: 40 y.o. MRN: [id number]  Chief Complaint  Patient presents with  . Hypertension  . Gastroesophageal Reflux    HPI HTN- states he had a CPE on Wednesday of this week and his b/p was elevated.  GERD-sometimes has chest discomfort. Current Outpatient Medications on File Prior to Visit  Medication Sig Dispense Refill  . fluticasone (FLONASE) 50 MCG/ACT nasal spray Place 2 sprays into both nostrils daily. 16 g 6  . omeprazole (PRILOSEC) 40 MG capsule Take 1 capsule (40 mg total) by mouth daily. 30 capsule 1   No current facility-administered medications on file prior to visit.   Past Medical History:  Diagnosis Date  . Acid reflux    No past surgical history on file.  Family History  Problem Relation Age of Onset  . Hypertension Mother   . Heart attack Father   . Hypertension Sister   . Hyperlipidemia Brother    Social History   Socioeconomic History  . Marital status: Married    Spouse name: Not on file  . Number of children: Not on file  . Years of education: Not on file  . Highest education level: Not on file  Occupational History  . Not on file  Tobacco Use  . Smoking status: Never Smoker  . Smokeless tobacco: Never Used  Substance and Sexual Activity  . Alcohol use: Yes    Alcohol/week: 1.0 - 2.0 standard drink    Types: 1 - 2 Cans of beer per week    Comment: twice weekly   . Drug use: Never  . Sexual activity: Yes  Other Topics Concern  . Not on file  Social History Narrative  . Not on file   Social Determinants of Health   Financial Resource Strain: Not on file  Food Insecurity: Not on file  Transportation Needs: Not on file  Physical Activity: Not on file  Stress: Not on file  Social Connections: Not on file    Review of Systems  Constitutional: Negative for chills, diaphoresis, fatigue and fever.  HENT: Negative for congestion, ear pain and sore throat.   Respiratory:  Negative for cough and shortness of breath.   Cardiovascular: Negative for chest pain and leg swelling.  Gastrointestinal: Negative for abdominal pain, constipation, diarrhea, nausea and vomiting.       GERD  Genitourinary: Negative for dysuria and urgency.  Musculoskeletal: Negative for arthralgias and myalgias.  Allergic/Immunologic: Positive for environmental allergies.  Neurological: Negative for dizziness and headaches.  Psychiatric/Behavioral: Negative for dysphoric mood.     Objective:  Pulse 71   Temp (!) 97.3 F (36.3 C)   Ht 5\' 11"  (1.803 m)   Wt 264 lb (119.7 kg)   SpO2 98%   BMI 36.82 kg/m   BP/Weight 01/04/2021 11/19/2020 08/21/2020  Systolic BP - 140 -  Diastolic BP - 88 -  Wt. (Lbs) 264 267.6 251  BMI 36.82 37.32 35.01    Physical Exam Vitals reviewed.  Constitutional:      Appearance: Normal appearance. He is normal weight.  Cardiovascular:     Rate and Rhythm: Normal rate and regular rhythm.     Heart sounds: No murmur heard.   Pulmonary:     Effort: Pulmonary effort is normal.     Breath sounds: Normal breath sounds.  Abdominal:     General: Abdomen is flat. Bowel sounds are normal.     Palpations: Abdomen is soft.  Tenderness: There is no abdominal tenderness.  Neurological:     Mental Status: He is alert and oriented to person, place, and time.  Psychiatric:        Mood and Affect: Mood normal.        Behavior: Behavior normal.     Diabetic Foot Exam - Simple   No data filed      Lab Results  Component Value Date   WBC 5.7 04/21/2020   HGB 13.7 04/21/2020   HCT 42.9 04/21/2020   PLT 254 04/21/2020   GLUCOSE 107 (H) 04/21/2020   NA 138 04/21/2020   K 4.6 04/21/2020   CL 105 04/21/2020   CREATININE 1.06 04/21/2020   BUN 16 04/21/2020   CO2 26 04/21/2020      Assessment & Plan:   There are no diagnoses linked to this encounter.   No orders of the defined types were placed in this encounter.   No orders of the defined  types were placed in this encounter.    Follow-up: No follow-ups on file.  An After Visit Summary was printed and given to the patient.  Janie Morning, NP [name] [phone]    I,Lennex Pietila A Quatavious Rossa,acting as a scribe for Janie Morning, NP.,have documented all relevant documentation on the behalf of Janie Morning, NP,as directed by  Janie Morning, NP while in the presence of Janie Morning, NP.

## 2021-01-04 NOTE — Patient Instructions (Addendum)
Begin HCTZ 12.5 mg daily DASH diet Measure BP at home morning and evening Return in 2 weeks for BP recheck Begin Nexium 40 mg twice daily Discontinue Prilosec Avoid food triggers  Managing Your Hypertension Hypertension, also called high blood pressure, is when the force of the blood pressing against the walls of the arteries is too strong. Arteries are blood vessels that carry blood from your heart throughout your body. Hypertension forces the heart to work harder to pump blood and may cause the arteries to become narrow or stiff. Understanding blood pressure readings Your personal target blood pressure may vary depending on your medical conditions, your age, and other factors. A blood pressure reading includes a higher number over a lower number. Ideally, your blood pressure should be below 120/80. You should know that:  The first, or top, number is called the systolic pressure. It is a measure of the pressure in your arteries as your heart beats.  The second, or bottom number, is called the diastolic pressure. It is a measure of the pressure in your arteries as the heart relaxes. Blood pressure is classified into four stages. Based on your blood pressure reading, your health care provider may use the following stages to determine what type of treatment you need, if any. Systolic pressure and diastolic pressure are measured in a unit called mmHg. Normal  Systolic pressure: below 120.  Diastolic pressure: below 80. Elevated  Systolic pressure: 120-129.  Diastolic pressure: below 80. Hypertension stage 1  Systolic pressure: 130-139.  Diastolic pressure: 80-89. Hypertension stage 2  Systolic pressure: 140 or above.  Diastolic pressure: 90 or above. How can this condition affect me? Managing your hypertension is an important responsibility. Over time, hypertension can damage the arteries and decrease blood flow to important parts of the body, including the brain, heart, and  kidneys. Having untreated or uncontrolled hypertension can lead to:  A heart attack.  A stroke.  A weakened blood vessel (aneurysm).  Heart failure.  Kidney damage.  Eye damage.  Metabolic syndrome.  Memory and concentration problems.  Vascular dementia. What actions can I take to manage this condition? Hypertension can be managed by making lifestyle changes and possibly by taking medicines. Your health care provider will help you make a plan to bring your blood pressure within a normal range. Nutrition  Eat a diet that is high in fiber and potassium, and low in salt (sodium), added sugar, and fat. An example eating plan is called the Dietary Approaches to Stop Hypertension (DASH) diet. To eat this way: ? Eat plenty of fresh fruits and vegetables. Try to fill one-half of your plate at each meal with fruits and vegetables. ? Eat whole grains, such as whole-wheat pasta, brown rice, or whole-grain bread. Fill about one-fourth of your plate with whole grains. ? Eat low-fat dairy products. ? Avoid fatty cuts of meat, processed or cured meats, and poultry with skin. Fill about one-fourth of your plate with lean proteins such as fish, chicken without skin, beans, eggs, and tofu. ? Avoid pre-made and processed foods. These tend to be higher in sodium, added sugar, and fat.  Reduce your daily sodium intake. Most people with hypertension should eat less than 1,500 mg of sodium a day.   Lifestyle  Work with your health care provider to maintain a healthy body weight or to lose weight. Ask what an ideal weight is for you.  Get at least 30 minutes of exercise that causes your heart to beat faster (aerobic exercise)  most days of the week. Activities may include walking, swimming, or biking.  Include exercise to strengthen your muscles (resistance exercise), such as weight lifting, as part of your weekly exercise routine. Try to do these types of exercises for 30 minutes at least 3 days a  week.  Do not use any products that contain nicotine or tobacco, such as cigarettes, e-cigarettes, and chewing tobacco. If you need help quitting, ask your health care provider.  Control any long-term (chronic) conditions you have, such as high cholesterol or diabetes.  Identify your sources of stress and find ways to manage stress. This may include meditation, deep breathing, or making time for fun activities.   Alcohol use  Do not drink alcohol if: ? Your health care provider tells you not to drink. ? You are pregnant, may be pregnant, or are planning to become pregnant.  If you drink alcohol: ? Limit how much you use to:  0-1 drink a day for women.  0-2 drinks a day for men. ? Be aware of how much alcohol is in your drink. In the [place] one drink equals one 12 oz bottle of beer (355 mL), one 5 oz glass of wine (148 mL), or one 1 oz glass of hard liquor (44 mL). Medicines Your health care provider may prescribe medicine if lifestyle changes are not enough to get your blood pressure under control and if:  Your systolic blood pressure is 130 or higher.  Your diastolic blood pressure is 80 or higher. Take medicines only as told by your health care provider. Follow the directions carefully. Blood pressure medicines must be taken as told by your health care provider. The medicine does not work as well when you skip doses. Skipping doses also puts you at risk for problems. Monitoring Before you monitor your blood pressure:  Do not smoke, drink caffeinated beverages, or exercise within 30 minutes before taking a measurement.  Use the bathroom and empty your bladder (urinate).  Sit quietly for at least 5 minutes before taking measurements. Monitor your blood pressure at home as told by your health care provider. To do this:  Sit with your back straight and supported.  Place your feet flat on the floor. Do not cross your legs.  Support your arm on a flat surface, such as a table.  Make sure your upper arm is at heart level.  Each time you measure, take two or three readings one minute apart and record the results. You may also need to have your blood pressure checked regularly by your health care provider.   General information  Talk with your health care provider about your diet, exercise habits, and other lifestyle factors that may be contributing to hypertension.  Review all the medicines you take with your health care provider because there may be side effects or interactions.  Keep all visits as told by your health care provider. Your health care provider can help you create and adjust your plan for managing your high blood pressure. Where to find more information  National Heart, Lung, and Blood Institute: PopSteam.is  American Heart Association: www.heart.org Contact a health care provider if:  You think you are having a reaction to medicines you have taken.  You have repeated (recurrent) headaches.  You feel dizzy.  You have swelling in your ankles.  You have trouble with your vision. Get help right away if:  You develop a severe headache or confusion.  You have unusual weakness or numbness, or you feel faint.  You have severe pain in your chest or abdomen.  You vomit repeatedly.  You have trouble breathing. These symptoms may represent a serious problem that is an emergency. Do not wait to see if the symptoms will go away. Get medical help right away. Call your local emergency services (911 in the [place] Do not drive yourself to the hospital. Summary  Hypertension is when the force of blood pumping through your arteries is too strong. If this condition is not controlled, it may put you at risk for serious complications.  Your personal target blood pressure may vary depending on your medical conditions, your age, and other factors. For most people, a normal blood pressure is less than 120/80.  Hypertension is managed by lifestyle  changes, medicines, or both.  Lifestyle changes to help manage hypertension include losing weight, eating a healthy, low-sodium diet, exercising more, stopping smoking, and limiting alcohol. This information is not intended to replace advice given to you by your health care provider. Make sure you discuss any questions you have with your health care provider. Document Revised: 09/16/2019 Document Reviewed: 07/12/2019 Elsevier Patient Education  2021 Elsevier Inc. [email or web address].[email or web address]">  DASH Eating Plan DASH stands for Dietary Approaches to Stop Hypertension. The DASH eating plan is a healthy eating plan that has been shown to:  Reduce high blood pressure (hypertension).  Reduce your risk for type 2 diabetes, heart disease, and stroke.  Help with weight loss. What are tips for following this plan? Reading food labels  Check food labels for the amount of salt (sodium) per serving. Choose foods with less than 5 percent of the Daily Value of sodium. Generally, foods with less than 300 milligrams (mg) of sodium per serving fit into this eating plan.  To find whole grains, look for the word "whole" as the first word in the ingredient list. Shopping  Buy products labeled as "low-sodium" or "no salt added."  Buy fresh foods. Avoid canned foods and pre-made or frozen meals. Cooking  Avoid adding salt when cooking. Use salt-free seasonings or herbs instead of table salt or sea salt. Check with your health care provider or pharmacist before using salt substitutes.  Do not fry foods. Cook foods using healthy methods such as baking, boiling, grilling, roasting, and broiling instead.  Cook with heart-healthy oils, such as olive, canola, avocado, soybean, or sunflower oil. Meal planning  Eat a balanced diet that includes: ? 4 or more servings of fruits and 4 or more servings of vegetables each day. Try to fill one-half of your plate with fruits and  vegetables. ? 6-8 servings of whole grains each day. ? Less than 6 oz (170 g) of lean meat, poultry, or fish each day. A 3-oz (85-g) serving of meat is about the same size as a deck of cards. One egg equals 1 oz (28 g). ? 2-3 servings of low-fat dairy each day. One serving is 1 cup (237 mL). ? 1 serving of nuts, seeds, or beans 5 times each week. ? 2-3 servings of heart-healthy fats. Healthy fats called omega-3 fatty acids are found in foods such as walnuts, flaxseeds, fortified milks, and eggs. These fats are also found in cold-water fish, such as sardines, salmon, and mackerel.  Limit how much you eat of: ? Canned or prepackaged foods. ? Food that is high in trans fat, such as some fried foods. ? Food that is high in saturated fat, such as fatty meat. ? Desserts and other sweets, sugary drinks, and other foods with added  sugar. ? Full-fat dairy products.  Do not salt foods before eating.  Do not eat more than 4 egg yolks a week.  Try to eat at least 2 vegetarian meals a week.  Eat more home-cooked food and less restaurant, buffet, and fast food.   Lifestyle  When eating at a restaurant, ask that your food be prepared with less salt or no salt, if possible.  If you drink alcohol: ? Limit how much you use to:  0-1 drink a day for women who are not pregnant.  0-2 drinks a day for men. ? Be aware of how much alcohol is in your drink. In the [place] one drink equals one 12 oz bottle of beer (355 mL), one 5 oz glass of wine (148 mL), or one 1 oz glass of hard liquor (44 mL). General information  Avoid eating more than 2,300 mg of salt a day. If you have hypertension, you may need to reduce your sodium intake to 1,500 mg a day.  Work with your health care provider to maintain a healthy body weight or to lose weight. Ask what an ideal weight is for you.  Get at least 30 minutes of exercise that causes your heart to beat faster (aerobic exercise) most days of the week. Activities may  include walking, swimming, or biking.  Work with your health care provider or dietitian to adjust your eating plan to your individual calorie needs. What foods should I eat? Fruits All fresh, dried, or frozen fruit. Canned fruit in natural juice (without added sugar). Vegetables Fresh or frozen vegetables (raw, steamed, roasted, or grilled). Low-sodium or reduced-sodium tomato and vegetable juice. Low-sodium or reduced-sodium tomato sauce and tomato paste. Low-sodium or reduced-sodium canned vegetables. Grains Whole-grain or whole-wheat bread. Whole-grain or whole-wheat pasta. Brown rice. Orpah Cobb. Bulgur. Whole-grain and low-sodium cereals. Pita bread. Low-fat, low-sodium crackers. Whole-wheat flour tortillas. Meats and other proteins Skinless chicken or [place]. Ground chicken or [place]. Pork with fat trimmed off. Fish and seafood. Egg whites. Dried beans, peas, or lentils. Unsalted nuts, nut butters, and seeds. Unsalted canned beans. Lean cuts of beef with fat trimmed off. Low-sodium, lean precooked or cured meat, such as sausages or meat loaves. Dairy Low-fat (1%) or fat-free (skim) milk. Reduced-fat, low-fat, or fat-free cheeses. Nonfat, low-sodium ricotta or cottage cheese. Low-fat or nonfat yogurt. Low-fat, low-sodium cheese. Fats and oils Soft margarine without trans fats. Vegetable oil. Reduced-fat, low-fat, or light mayonnaise and salad dressings (reduced-sodium). Canola, safflower, olive, avocado, soybean, and sunflower oils. Avocado. Seasonings and condiments Herbs. Spices. Seasoning mixes without salt. Other foods Unsalted popcorn and pretzels. Fat-free sweets. The items listed above may not be a complete list of foods and beverages you can eat. Contact a dietitian for more information. What foods should I avoid? Fruits Canned fruit in a light or heavy syrup. Fried fruit. Fruit in cream or butter sauce. Vegetables Creamed or fried vegetables. Vegetables in a cheese sauce.  Regular canned vegetables (not low-sodium or reduced-sodium). Regular canned tomato sauce and paste (not low-sodium or reduced-sodium). Regular tomato and vegetable juice (not low-sodium or reduced-sodium). Rosita Fire. Olives. Grains Baked goods made with fat, such as croissants, muffins, or some breads. Dry pasta or rice meal packs. Meats and other proteins Fatty cuts of meat. Ribs. Fried meat. Tomasa Blase. Bologna, salami, and other precooked or cured meats, such as sausages or meat loaves. Fat from the back of a pig (fatback). Bratwurst. Salted nuts and seeds. Canned beans with added salt. Canned or smoked fish. Whole  eggs or egg yolks. Chicken or [place] with skin. Dairy Whole or 2% milk, cream, and half-and-half. Whole or full-fat cream cheese. Whole-fat or sweetened yogurt. Full-fat cheese. Nondairy creamers. Whipped toppings. Processed cheese and cheese spreads. Fats and oils Butter. Stick margarine. Lard. Shortening. Ghee. Bacon fat. Tropical oils, such as coconut, palm kernel, or palm oil. Seasonings and condiments Onion salt, garlic salt, seasoned salt, table salt, and sea salt. Worcestershire sauce. Tartar sauce. Barbecue sauce. Teriyaki sauce. Soy sauce, including reduced-sodium. Steak sauce. Canned and packaged gravies. Fish sauce. Oyster sauce. Cocktail sauce. Store-bought horseradish. Ketchup. Mustard. Meat flavorings and tenderizers. Bouillon cubes. Hot sauces. Pre-made or packaged marinades. Pre-made or packaged taco seasonings. Relishes. Regular salad dressings. Other foods Salted popcorn and pretzels. The items listed above may not be a complete list of foods and beverages you should avoid. Contact a dietitian for more information. Where to find more information  National Heart, Lung, and Blood Institute: PopSteam.is  American Heart Association: www.heart.org  Academy of Nutrition and Dietetics: www.eatright.org  National Kidney Foundation: www.kidney.org Summary  The DASH  eating plan is a healthy eating plan that has been shown to reduce high blood pressure (hypertension). It may also reduce your risk for type 2 diabetes, heart disease, and stroke.  When on the DASH eating plan, aim to eat more fresh fruits and vegetables, whole grains, lean proteins, low-fat dairy, and heart-healthy fats.  With the DASH eating plan, you should limit salt (sodium) intake to 2,300 mg a day. If you have hypertension, you may need to reduce your sodium intake to 1,500 mg a day.  Work with your health care provider or dietitian to adjust your eating plan to your individual calorie needs. This information is not intended to replace advice given to you by your health care provider. Make sure you discuss any questions you have with your health care provider. Document Revised: 07/15/2019 Document Reviewed: 07/15/2019 Elsevier Patient Education  2021 Elsevier Inc.  Blood Pressure Record Sheet To take your blood pressure, you will need a blood pressure machine. You can buy a blood pressure machine (blood pressure monitor) at your clinic, drug store, or online. When choosing one, consider:  An automatic monitor that has an arm cuff.  A cuff that wraps snugly around your upper arm. You should be able to fit only one finger between your arm and the cuff.  A device that stores blood pressure reading results.  Do not choose a monitor that measures your blood pressure from your wrist or finger. Follow your health care provider's instructions for how to take your blood pressure. To use this form:  Get one reading in the morning (a.m.) before you take any medicines.  Get one reading in the evening (p.m.) before supper.  Take at least 2 readings with each blood pressure check. This makes sure the results are correct. Wait 1-2 minutes between measurements.  Write down the results in the spaces on this form.  Repeat this once a week, or as told by your health care provider.  Make a  follow-up appointment with your health care provider to discuss the results. Blood pressure log Date: _______________________  a.m. _____________________(1st reading) _____________________(2nd reading)  p.m. _____________________(1st reading) _____________________(2nd reading) Date: _______________________  a.m. _____________________(1st reading) _____________________(2nd reading)  p.m. _____________________(1st reading) _____________________(2nd reading) Date: _______________________  a.m. _____________________(1st reading) _____________________(2nd reading)  p.m. _____________________(1st reading) _____________________(2nd reading) Date: _______________________  a.m. _____________________(1st reading) _____________________(2nd reading)  p.m. _____________________(1st reading) _____________________(2nd reading) Date: _______________________  a.m. _____________________(1st reading)  _____________________(2nd reading)  p.m. _____________________(1st reading) _____________________(2nd reading) This information is not intended to replace advice given to you by your health care provider. Make sure you discuss any questions you have with your health care provider. Document Revised: 11/30/2019 Document Reviewed: 11/30/2019 Elsevier Patient Education  2021 Elsevier Inc.  Gastroesophageal Reflux Disease, Adult  Gastroesophageal reflux (GER) happens when acid from the stomach flows up into the tube that connects the mouth and the stomach (esophagus). Normally, food travels down the esophagus and stays in the stomach to be digested. With GER, food and stomach acid sometimes move back up into the esophagus. You may have a disease called gastroesophageal reflux disease (GERD) if the reflux: Happens often. Causes frequent or very bad symptoms. Causes problems such as damage to the esophagus. When this happens, the esophagus becomes sore and swollen. Over time, GERD can make small holes  (ulcers) in the lining of the esophagus. What are the causes? This condition is caused by a problem with the muscle between the esophagus and the stomach. When this muscle is weak or not normal, it does not close properly to keep food and acid from coming back up from the stomach. The muscle can be weak because of: Tobacco use. Pregnancy. Having a certain type of hernia (hiatal hernia). Alcohol use. Certain foods and drinks, such as coffee, chocolate, onions, and peppermint. What increases the risk? Being overweight. Having a disease that affects your connective tissue. Taking NSAIDs, such a ibuprofen. What are the signs or symptoms? Heartburn. Difficult or painful swallowing. The feeling of having a lump in the throat. A bitter taste in the mouth. Bad breath. Having a lot of saliva. Having an upset or bloated stomach. Burping. Chest pain. Different conditions can cause chest pain. Make sure you see your doctor if you have chest pain. Shortness of breath or wheezing. A long-term cough or a cough at night. Wearing away of the surface of teeth (tooth enamel). Weight loss. How is this treated? Making changes to your diet. Taking medicine. Having surgery. Treatment will depend on how bad your symptoms are. Follow these instructions at home: Eating and drinking Follow a diet as told by your doctor. You may need to avoid foods and drinks such as: Coffee and tea, with or without caffeine. Drinks that contain alcohol. Energy drinks and sports drinks. Bubbly (carbonated) drinks or sodas. Chocolate and cocoa. Peppermint and mint flavorings. Garlic and onions. Horseradish. Spicy and acidic foods. These include peppers, chili powder, curry powder, vinegar, hot sauces, and BBQ sauce. Citrus fruit juices and citrus fruits, such as oranges, lemons, and limes. Tomato-based foods. These include red sauce, chili, salsa, and pizza with red sauce. Fried and fatty foods. These include  donuts, french fries, potato chips, and high-fat dressings. High-fat meats. These include hot dogs, rib eye steak, sausage, ham, and bacon. High-fat dairy items, such as whole milk, butter, and cream cheese. Eat small meals often. Avoid eating large meals. Avoid drinking large amounts of liquid with your meals. Avoid eating meals during the 2-3 hours before bedtime. Avoid lying down right after you eat. Do not exercise right after you eat.   Lifestyle Do not smoke or use any products that contain nicotine or tobacco. If you need help quitting, ask your doctor. Try to lower your stress. If you need help doing this, ask your doctor. If you are overweight, lose an amount of weight that is healthy for you. Ask your doctor about a safe weight loss goal.   General  instructions Pay attention to any changes in your symptoms. Take over-the-counter and prescription medicines only as told by your doctor. Do not take aspirin, ibuprofen, or other NSAIDs unless your doctor says it is okay. Wear loose clothes. Do not wear anything tight around your waist. Raise (elevate) the head of your bed about 6 inches (15 cm). You may need to use a wedge to do this. Avoid bending over if this makes your symptoms worse. Keep all follow-up visits. Contact a doctor if: You have new symptoms. You lose weight and you do not know why. You have trouble swallowing or it hurts to swallow. You have wheezing or a cough that keeps happening. You have a hoarse voice. Your symptoms do not get better with treatment. Get help right away if: You have sudden pain in your arms, neck, jaw, teeth, or back. You suddenly feel sweaty, dizzy, or light-headed. You have chest pain or shortness of breath. You vomit and the vomit is green, yellow, or black, or it looks like blood or coffee grounds. You faint. Your poop (stool) is red, bloody, or black. You cannot swallow, drink, or eat. These symptoms may represent a serious problem  that is an emergency. Do not wait to see if the symptoms will go away. Get medical help right away. Call your local emergency services (911 in the [place] Do not drive yourself to the hospital. Summary If a person has gastroesophageal reflux disease (GERD), food and stomach acid move back up into the esophagus and cause symptoms or problems such as damage to the esophagus. Treatment will depend on how bad your symptoms are. Follow a diet as told by your doctor. Take all medicines only as told by your doctor. This information is not intended to replace advice given to you by your health care provider. Make sure you discuss any questions you have with your health care provider. Document Revised: 02/20/2020 Document Reviewed: 02/20/2020 Elsevier Patient Education  2021 Elsevier Inc.  Food Choices for Gastroesophageal Reflux Disease, Adult When you have gastroesophageal reflux disease (GERD), the foods you eat and your eating habits are very important. Choosing the right foods can help ease your discomfort. Think about working with a food expert (dietitian) to help you make good choices. What are tips for following this plan? Reading food labels  Look for foods that are low in saturated fat. Foods that may help with your symptoms include: ? Foods that have less than 5% of daily value (DV) of fat. ? Foods that have 0 grams of trans fat. Cooking  Do not fry your food.  Cook your food by baking, steaming, grilling, or broiling. These are all methods that do not need a lot of fat for cooking.  To add flavor, try to use herbs that are low in spice and acidity. Meal planning  Choose healthy foods that are low in fat, such as: ? Fruits and vegetables. ? Whole grains. ? Low-fat dairy products. ? Lean meats, fish, and poultry.  Eat small meals often instead of eating 3 large meals each day. Eat your meals slowly in a place where you are relaxed. Avoid bending over or lying down until 2-3 hours  after eating.  Limit high-fat foods such as fatty meats or fried foods.  Limit your intake of fatty foods, such as oils, butter, and shortening.  Avoid the following as told by your doctor: ? Foods that cause symptoms. These may be different for different people. Keep a food diary to keep track  of foods that cause symptoms. ? Alcohol. ? Drinking a lot of liquid with meals. ? Eating meals during the 2-3 hours before bed.   Lifestyle  Stay at a healthy weight. Ask your doctor what weight is healthy for you. If you need to lose weight, work with your doctor to do so safely.  Exercise for at least 30 minutes on 5 or more days each week, or as told by your doctor.  Wear loose-fitting clothes.  Do not smoke or use any products that contain nicotine or tobacco. If you need help quitting, ask your doctor.  Sleep with the head of your bed higher than your feet. Use a wedge under the mattress or blocks under the bed frame to raise the head of the bed.  Chew sugar-free gum after meals. What foods should eat? Eat a healthy, well-balanced diet of fruits, vegetables, whole grains, low-fat dairy products, lean meats, fish, and poultry. Each person is different. Foods that may cause symptoms in one person may not cause any symptoms in another person. Work with your doctor to find foods that are safe for you. The items listed above may not be a complete list of what you can eat and drink. Contact a food expert for more options.   What foods should I avoid? Limiting some of these foods may help in managing the symptoms of GERD. Everyone is different. Talk with a food expert or your doctor to help you find the exact foods to avoid, if any. Fruits Any fruits prepared with added fat. Any fruits that cause symptoms. For some people, this may include citrus fruits, such as oranges, grapefruit, pineapple, and lemons. Vegetables Deep-fried vegetables. [place] fries. Any vegetables prepared with added fat. Any  vegetables that cause symptoms. For some people, this may include tomatoes and tomato products, chili peppers, onions and garlic, and horseradish. Grains Pastries or quick breads with added fat. Meats and other proteins High-fat meats, such as fatty beef or pork, hot dogs, ribs, ham, sausage, salami, and bacon. Fried meat or protein, including fried fish and fried chicken. Nuts and nut butters, in large amounts. Dairy Whole milk and chocolate milk. Sour cream. Cream. Ice cream. Cream cheese. Milkshakes. Fats and oils Butter. Margarine. Shortening. Ghee. Beverages Coffee and tea, with or without caffeine. Carbonated beverages. Sodas. Energy drinks. Fruit juice made with acidic fruits, such as orange or grapefruit. Tomato juice. Alcoholic drinks. Sweets and desserts Chocolate and cocoa. Donuts. Seasonings and condiments Pepper. Peppermint and spearmint. Added salt. Any condiments, herbs, or seasonings that cause symptoms. For some people, this may include curry, hot sauce, or vinegar-based salad dressings. The items listed above may not be a complete list of what you should not eat and drink. Contact a food expert for more options. Questions to ask your doctor Diet and lifestyle changes are often the first steps that are taken to manage symptoms of GERD. If diet and lifestyle changes do not help, talk with your doctor about taking medicines. Where to find more information  International Foundation for Gastrointestinal Disorders: aboutgerd.org Summary  When you have GERD, food and lifestyle choices are very important in easing your symptoms.  Eat small meals often instead of 3 large meals a day. Eat your meals slowly and in a place where you are relaxed.  Avoid bending over or lying down until 2-3 hours after eating.  Limit high-fat foods such as fatty meats or fried foods. This information is not intended to replace advice given to you by  your health care provider. Make sure you discuss  any questions you have with your health care provider. Document Revised: 02/20/2020 Document Reviewed: 02/20/2020 Elsevier Patient Education  2021 [employer].

## 2021-01-04 NOTE — Progress Notes (Signed)
Established Patient Office Visit  Subjective:  Patient ID: Brett Roberts, male    DOB: 04-04-1981  Age: 40 y.o. MRN: [id number]  CC:  Chief Complaint  Patient presents with  . Elevated blood pressure  . Gastroesophageal Reflux    HPI Brett Roberts is a 40 year-old Caucasian male that presents for follow-up of elevated blood pressure without diagnosis of hypertension. BP in office today 130/80. Brett Roberts underwent an employment physical on 01/02/21. He was informed his BP was elevated at 144/94 and to f/u with PCP. He denies current chest pain , dizziness, chronic headaches, or dyspnea. He tells me there is a family history of hypertension with mother and sister. He does not currently have a personal cuff to monitor BP at home.   Brett Roberts tells me that he has experienced intermittent chest pain at rest. States pain is mid-sternal and radiates to left arm. Denies nausea, sweating, or chest pressure/heaviness. States pain is sharp and stabbing, lasting several minutes.  He tells me the pain sometimes awakens him at night and causes him to sit up on bedside. Pt was seen in ED for precordial pain on 04/21/20. He was treated with a GI cocktail which relieved symptoms. Troponin x 2 negative. Brett Roberts added that his father had premature coronary artery disease with first MI in 78s, second in his 40s, and fatal MI at age 40. States brother has hyperlipidemia. Pt states he has gained weight, approximately 25 pounds over the past few years.He does not smoke. Denies intolerance to fatty foods or RUQ pain. He has agreed to cardiac referral for further evaluation considering strong family history of CAD.  Brett Roberts has a past medical history of GERD. Current treatment includes Omeprazole 40 mg daily.States symptoms are not currently well-controlled.Symptoms include regurgitation of gastric content and burning sensation in retro-sternal area. He works 12-hour shifts and commutes 70 minutes each way to work. Admits to  eating late at night, approximately 9 pm on days that he works. He tries to avoid acidic foods that trigger symptoms of mid-epigastric discomfort.     Past Medical History:  Diagnosis Date  . Acid reflux     No past surgical history on file.  Family History  Problem Relation Age of Onset  . Hypertension Mother   . Heart attack Father   . Hypertension Sister   . Hyperlipidemia Brother     Social History   Socioeconomic History  . Marital status: Married    Spouse name: Not on file  . Number of children: Not on file  . Years of education: Not on file  . Highest education level: Not on file  Occupational History  . Not on file  Tobacco Use  . Smoking status: Never Smoker  . Smokeless tobacco: Never Used  Substance and Sexual Activity  . Alcohol use: Yes    Alcohol/week: 1.0 - 2.0 standard drink    Types: 1 - 2 Cans of beer per week    Comment: twice weekly   . Drug use: Never  . Sexual activity: Yes  Other Topics Concern  . Not on file  Social History Narrative  . Not on file      Outpatient Medications Prior to Visit  Medication Sig Dispense Refill  . fluticasone (FLONASE) 50 MCG/ACT nasal spray Place 2 sprays into both nostrils daily. 16 g 6  . omeprazole (PRILOSEC) 40 MG capsule Take 1 capsule (40 mg total) by mouth daily. 30 capsule 1   No facility-administered medications prior  to visit.    No Known Allergies  ROS Review of Systems  Constitutional: Negative for appetite change, fatigue and unexpected weight change.  HENT: Positive for rhinorrhea. Negative for congestion, ear pain, sinus pressure, sinus pain and tinnitus.   Eyes: Negative for pain.  Respiratory: Negative for cough and shortness of breath.   Cardiovascular: Positive for chest pain (intermittent). Negative for palpitations and leg swelling.  Gastrointestinal: Negative for abdominal pain, constipation, diarrhea, nausea and vomiting.       GERD  Endocrine: Negative for cold intolerance,  heat intolerance, polydipsia, polyphagia and polyuria.  Genitourinary: Negative for dysuria, frequency and hematuria.  Musculoskeletal: Negative for arthralgias, back pain, joint swelling and myalgias.  Skin: Negative for rash.  Allergic/Immunologic: Positive for environmental allergies.  Neurological: Negative for dizziness and headaches.  Hematological: Negative for adenopathy.  Psychiatric/Behavioral: Negative for decreased concentration and sleep disturbance. The patient is not nervous/anxious.       Objective:    Physical Exam Vitals reviewed.  Constitutional:      Appearance: Normal appearance. He is obese.  HENT:     Head: Normocephalic.     Right Ear: Tympanic membrane normal.     Left Ear: Tympanic membrane normal.     Nose: Congestion and rhinorrhea present.     Mouth/Throat:     Pharynx: Posterior oropharyngeal erythema present.  Cardiovascular:     Rate and Rhythm: Normal rate and regular rhythm.     Pulses: Normal pulses.     Heart sounds: Normal heart sounds.  Pulmonary:     Effort: Pulmonary effort is normal.     Breath sounds: Normal breath sounds.  Abdominal:     General: Bowel sounds are normal.     Palpations: Abdomen is soft.  Musculoskeletal:     Cervical back: Neck supple.  Skin:    General: Skin is warm and dry.     Capillary Refill: Capillary refill takes less than 2 seconds.  Neurological:     General: No focal deficit present.     Mental Status: He is alert and oriented to person, place, and time.  Psychiatric:        Mood and Affect: Mood normal.        Behavior: Behavior normal.     BP 130/80   Pulse 71   Temp (!) 97.3 F (36.3 C)   Ht 5\' 11"  (1.803 m)   Wt 264 lb (119.7 kg)   SpO2 98%   BMI 36.82 kg/m  Wt Readings from Last 3 Encounters:  01/04/21 264 lb (119.7 kg)  11/19/20 267 lb 9.6 oz (121.4 kg)  08/21/20 251 lb (113.9 kg)     Health Maintenance Due  Topic Date Due  . COVID-19 Vaccine (1) Never done  . HIV Screening   Never done  . Hepatitis C Screening  Never done  . TETANUS/TDAP  Never done    Lab Results  Component Value Date   WBC 5.7 04/21/2020   HGB 13.7 04/21/2020   HCT 42.9 04/21/2020   MCV 90.7 04/21/2020   PLT 254 04/21/2020   Lab Results  Component Value Date   NA 138 04/21/2020   K 4.6 04/21/2020   CO2 26 04/21/2020   GLUCOSE 107 (H) 04/21/2020   BUN 16 04/21/2020   CREATININE 1.06 04/21/2020   CALCIUM 8.8 (L) 04/21/2020   ANIONGAP 7 04/21/2020      Assessment & Plan:   1. Essential hypertension - hydrochlorothiazide (HYDRODIURIL) 12.5 MG tablet; Take 1 tablet (  12.5 mg total) by mouth daily.  Dispense: 30 tablet; Refill: 3 - Cardiovascular Risk Assessment  2. Elevated blood pressure reading without diagnosis of hypertension - CBC with Differential/Platelet - Comprehensive metabolic panel - TSH - Lipid panel - Cardiovascular Risk Assessment  3. Chest pain at rest - Ambulatory referral to Cardiology  4. Epigastric abdominal tenderness without rebound tenderness - Ambulatory referral to Cardiology  5. Family history of premature CAD - hydrochlorothiazide (HYDRODIURIL) 12.5 MG tablet; Take 1 tablet (12.5 mg total) by mouth daily.  Dispense: 30 tablet; Refill: 3 - Ambulatory referral to Cardiology - Cardiovascular Risk Assessment  6. Gastroesophageal reflux disease without esophagitis - esomeprazole (NEXIUM) 40 MG capsule; Take 1 capsule (40 mg total) by mouth 2 (two) times daily before a meal.  Dispense: 60 capsule; Refill: 3  7. Body mass index (BMI) 36.0-36.9, adult -Increase physical activity -Heart healthy diet  Begin HCTZ 12.5 mg daily DASH diet Measure BP at home morning and evening Return in 2 weeks for BP recheck Begin Nexium 40 mg twice daily Discontinue Prilosec Avoid food triggers   Follow-up:2-weeks   Signed, Janie Morning, NP

## 2021-01-05 LAB — COMPREHENSIVE METABOLIC PANEL
ALT: 20 IU/L (ref 0–44)
AST: 13 IU/L (ref 0–40)
Albumin/Globulin Ratio: 1.6 (ref 1.2–2.2)
Albumin: 4.5 g/dL (ref 4.0–5.0)
Alkaline Phosphatase: 96 IU/L (ref 44–121)
BUN/Creatinine Ratio: 18 (ref 9–20)
BUN: 18 mg/dL (ref 6–24)
Bilirubin Total: 0.3 mg/dL (ref 0.0–1.2)
CO2: 23 mmol/L (ref 20–29)
Calcium: 9.3 mg/dL (ref 8.7–10.2)
Chloride: 103 mmol/L (ref 96–106)
Creatinine, Ser: 1 mg/dL (ref 0.76–1.27)
Globulin, Total: 2.9 g/dL (ref 1.5–4.5)
Glucose: 97 mg/dL (ref 65–99)
Potassium: 4.8 mmol/L (ref 3.5–5.2)
Sodium: 140 mmol/L (ref 134–144)
Total Protein: 7.4 g/dL (ref 6.0–8.5)
eGFR: 98 mL/min/{1.73_m2} (ref >59)

## 2021-01-05 LAB — CBC WITH DIFFERENTIAL/PLATELET
Basophils Absolute: 0 10*3/uL (ref 0.0–0.2)
Basos: 1 %
EOS (ABSOLUTE): 0.1 10*3/uL (ref 0.0–0.4)
Eos: 2 %
Hematocrit: 43 % (ref 37.5–51.0)
Hemoglobin: 14.6 g/dL (ref 13.0–17.7)
Immature Grans (Abs): 0 10*3/uL (ref 0.0–0.1)
Immature Granulocytes: 0 %
Lymphocytes Absolute: 2.4 10*3/uL (ref 0.7–3.1)
Lymphs: 39 %
MCH: 29.4 pg (ref 26.6–33.0)
MCHC: 34 g/dL (ref 31.5–35.7)
MCV: 87 fL (ref 79–97)
Monocytes Absolute: 0.6 10*3/uL (ref 0.1–0.9)
Monocytes: 10 %
Neutrophils Absolute: 3 10*3/uL (ref 1.4–7.0)
Neutrophils: 48 %
Platelets: 249 10*3/uL (ref 150–450)
RBC: 4.96 x10E6/uL (ref 4.14–5.80)
RDW: 13.1 % (ref 11.6–15.4)
WBC: 6.1 10*3/uL (ref 3.4–10.8)

## 2021-01-05 LAB — CARDIOVASCULAR RISK ASSESSMENT

## 2021-01-05 LAB — TSH: TSH: 1.58 u[IU]/mL (ref 0.450–4.500)

## 2021-01-05 LAB — LIPID PANEL
Chol/HDL Ratio: 5.3 ratio — ABNORMAL HIGH (ref 0.0–5.0)
Cholesterol, Total: 229 mg/dL — ABNORMAL HIGH (ref 100–199)
HDL: 43 mg/dL (ref >39)
LDL Chol Calc (NIH): 172 mg/dL — ABNORMAL HIGH (ref 0–99)
Triglycerides: 81 mg/dL (ref 0–149)
VLDL Cholesterol Cal: 14 mg/dL (ref 5–40)

## 2021-01-06 ENCOUNTER — Encounter: Payer: Self-pay | Admitting: Nurse Practitioner

## 2021-01-06 ENCOUNTER — Other Ambulatory Visit: Payer: Self-pay | Admitting: Nurse Practitioner

## 2021-01-06 DIAGNOSIS — E782 Mixed hyperlipidemia: Secondary | ICD-10-CM

## 2021-01-06 MED ORDER — ROSUVASTATIN CALCIUM 10 MG PO TABS: 10.0000 mg | ORAL_TABLET | Freq: Every day | ORAL | 0 refills | Status: DC

## 2021-01-07 ENCOUNTER — Encounter: Payer: Self-pay | Admitting: Cardiology

## 2021-01-07 ENCOUNTER — Other Ambulatory Visit: Payer: Self-pay

## 2021-01-07 ENCOUNTER — Ambulatory Visit: Payer: BC Managed Care – PPO | Admitting: Cardiology

## 2021-01-07 VITALS — BP 130/86 | HR 92 | Ht 71.0 in | Wt 265.0 lb

## 2021-01-07 DIAGNOSIS — Z8249 Family history of ischemic heart disease and other diseases of the circulatory system: Secondary | ICD-10-CM

## 2021-01-07 DIAGNOSIS — I10 Essential (primary) hypertension: Secondary | ICD-10-CM

## 2021-01-07 DIAGNOSIS — R079 Chest pain, unspecified: Secondary | ICD-10-CM | POA: Insufficient documentation

## 2021-01-07 DIAGNOSIS — G4733 Obstructive sleep apnea (adult) (pediatric): Secondary | ICD-10-CM

## 2021-01-07 DIAGNOSIS — R0789 Other chest pain: Secondary | ICD-10-CM

## 2021-01-07 DIAGNOSIS — E785 Hyperlipidemia, unspecified: Secondary | ICD-10-CM | POA: Diagnosis not present

## 2021-01-07 DIAGNOSIS — G4719 Other hypersomnia: Secondary | ICD-10-CM

## 2021-01-07 HISTORY — DX: Essential (primary) hypertension: I10

## 2021-01-07 HISTORY — DX: Obstructive sleep apnea (adult) (pediatric): G47.33

## 2021-01-07 HISTORY — DX: Family history of ischemic heart disease and other diseases of the circulatory system: Z82.49

## 2021-01-07 HISTORY — DX: Other chest pain: R07.89

## 2021-01-07 HISTORY — DX: Chest pain, unspecified: R07.9

## 2021-01-07 NOTE — Progress Notes (Signed)
g

## 2021-01-07 NOTE — Patient Instructions (Signed)
Medication Instructions:  Your physician recommends that you continue on your current medications as directed. Please refer to the Current Medication list given to you today.  *If you need a refill on your cardiac medications before your next appointment, please call your pharmacy*   Lab Work: None.   If you have labs (blood work) drawn today and your tests are completely normal, you will receive your results only by: Marland Kitchen MyChart Message (if you have MyChart) OR . A paper copy in the mail If you have any lab test that is abnormal or we need to change your treatment, we will call you to review the results.   Testing/Procedures: Your physician has requested that you have an exercise tolerance test. For further information please visit [email or web address] Please also follow instruction sheet, as given.   Your physician has recommended that you have a sleep study. This test records several body functions during sleep, including: brain activity, eye movement, oxygen and carbon dioxide blood levels, heart rate and rhythm, breathing rate and rhythm, the flow of air through your mouth and nose, snoring, body muscle movements, and chest and belly movement.    Follow-Up: At [facility], you and your health needs are our priority.  As part of our continuing mission to provide you with exceptional heart care, we have created designated Provider Care Teams.  These Care Teams include your primary Cardiologist (physician) and Advanced Practice Providers (APPs -  Physician Assistants and Nurse Practitioners) who all work together to provide you with the care you need, when you need it.  We recommend signing up for the patient portal called "MyChart".  Sign up information is provided on this After Visit Summary.  MyChart is used to connect with patients for Virtual Visits (Telemedicine).  Patients are able to view lab/test results, encounter notes, upcoming appointments, etc.  Non-urgent messages can be  sent to your provider as well.   To learn more about what you can do with MyChart, go to [email or web address].    Your next appointment:   6 week(s)  The format for your next appointment:   In Person  Provider:   Gypsy Balsam, MD   Other Instructions

## 2021-01-07 NOTE — Progress Notes (Signed)
Cardiology Consultation:    Date:  01/07/2021   ID:  Brett Roberts, DOB [birth year], MRN [id number]  PCP:  Blane Ohara, MD  Cardiologist:  Gypsy Balsam, MD   Referring MD: Janie Morning, NP   Chief Complaint  Patient presents with  . Chest Pain    03/2020    History of Present Illness:    Brett Roberts is a 40 y.o. male who is being seen today for the evaluation of chest pain at the request of Janie Morning, NP.  He was referred to [place] for evaluation of chest pain.  4 months he experiencing chest pain on the left side of his chest that pain usually last for few seconds is mild about 3 in scale up to 10, not related to exercise, moving of his shoulder arm did not make any difference.  There is no swelling there is no shortness of breath associated with this sensation.  At the same time he can work hard and he can walk and have no difficulty doing it.  He recently started walking on the treadmill he walks at a speed of 3 mph for about half an hour have no difficulty doing it.  No incline.  He does have multiple family members with coronary artery disease including his father and all his father side of the family.  He does not smoke, he just started exercising on the treadmill.  He snores a lot at night and his wife thinking he does have a sleep apnea.  Recently he was diagnosed with dyslipidemia he was just given medication to reduce his cholesterol.  He is LDL is 176.  He never smoke.  Also he was recently diagnosed with essential hypertension and medication for blood pressure has been given to him, however he did not take it yet.  Past Medical History:  Diagnosis Date  . Acid reflux     Past Surgical History:  Procedure Laterality Date  . NO PAST SURGERIES      Current Medications: Current Meds  Medication Sig  . esomeprazole (NEXIUM) 40 MG capsule Take 1 capsule (40 mg total) by mouth 2 (two) times daily before a meal.  . fluticasone (FLONASE) 50 MCG/ACT nasal spray  Place 2 sprays into both nostrils daily.     Allergies:   Patient has no known allergies.   Social History   Socioeconomic History  . Marital status: Married    Spouse name: Not on file  . Number of children: Not on file  . Years of education: Not on file  . Highest education level: Not on file  Occupational History  . Not on file  Tobacco Use  . Smoking status: Never Smoker  . Smokeless tobacco: Never Used  Substance and Sexual Activity  . Alcohol use: Yes    Alcohol/week: 1.0 - 2.0 standard drink    Types: 1 - 2 Cans of beer per week    Comment: twice weekly   . Drug use: Never  . Sexual activity: Yes  Other Topics Concern  . Not on file  Social History Narrative  . Not on file   Social Determinants of Health   Financial Resource Strain: Not on file  Food Insecurity: Not on file  Transportation Needs: Not on file  Physical Activity: Not on file  Stress: Not on file  Social Connections: Not on file     Family History: The patient's family history includes Heart attack in his father; Hyperlipidemia in his brother;  Hypertension in his mother and sister. ROS:   Please see the history of present illness.    All 14 point review of systems negative except as described per history of present illness.  EKGs/Labs/Other Studies Reviewed:    The following studies were reviewed today:   EKG:  EKG is  ordered today.  The ekg ordered today demonstrates normal sinus rhythm, normal P interval, normal QS complex duration morphology  Recent Labs: 01/04/2021: ALT 20; BUN 18; Creatinine, Ser 1.00; Hemoglobin 14.6; Platelets 249; Potassium 4.8; Sodium 140; TSH 1.580  Recent Lipid Panel    Component Value Date/Time   CHOL 229 (H) 01/04/2021 0822   TRIG 81 01/04/2021 0822   HDL 43 01/04/2021 0822   CHOLHDL 5.3 (H) 01/04/2021 0822   LDLCALC 172 (H) 01/04/2021 3244    Physical Exam:    VS:  BP 130/86 (BP Location: Left Arm, Patient Position: Sitting)   Pulse 92   Ht 5\' 11"   (1.803 m)   Wt 265 lb (120.2 kg)   SpO2 97%   BMI 36.96 kg/m     Wt Readings from Last 3 Encounters:  01/07/21 265 lb (120.2 kg)  01/04/21 264 lb (119.7 kg)  11/19/20 267 lb 9.6 oz (121.4 kg)     GEN:  Well nourished, well developed in no acute distress HEENT: Normal NECK: No JVD; No carotid bruits LYMPHATICS: No lymphadenopathy CARDIAC: RRR, no murmurs, no rubs, no gallops RESPIRATORY:  Clear to auscultation without rales, wheezing or rhonchi  ABDOMEN: Soft, non-tender, non-distended MUSCULOSKELETAL:  No edema; No deformity  SKIN: Warm and dry NEUROLOGIC:  Alert and oriented x 3 PSYCHIATRIC:  Normal affect   ASSESSMENT:    1. Atypical chest pain   2. Obstructive sleep apnea   3. Dyslipidemia   4. Essential hypertension   5. Family history of premature CAD    PLAN:    In order of problems listed above:  1. Atypical chest pain in this gentleman with multiple risk factors for coronary artery disease namely hypertension, dyslipidemia, multiple family members with premature coronary artery disease.  I think it would be reasonable to pursue stress testing.  I will schedule him to have exercise stress test.  He does have normal EKG.  In the meantime he is taking proton pump inhibitor which he cannot tell me if it helps him a lot. 2. Dyslipidemia I strongly recommended to start taking cholesterol medication.  He is LDL is high at 72 which is unacceptably high. 3. Essential hypertension which is borderline he was given medication to help with the blood pressure but he does not want to take it.  We did talk about nonpharmacological ways to reduce his blood pressure which includes exercises on the regular basis weight loss as well as avoidance of salt.  On top of that I strongly suspect he got significant sleep apnea with good management can improve his blood pressure. 4. Obstructive sleep apnea he did agree to have a sleep study I will schedule him to have the test.   Medication  Adjustments/Labs and Tests Ordered: Current medicines are reviewed at length with the patient today.  Concerns regarding medicines are outlined above.  No orders of the defined types were placed in this encounter.  No orders of the defined types were placed in this encounter.   Signed, 11/21/20, MD, [facility] 01/07/2021 2:11 PM    [facility]

## 2021-01-10 ENCOUNTER — Telehealth: Payer: Self-pay | Admitting: *Deleted

## 2021-01-10 NOTE — Telephone Encounter (Signed)
[employer] denied in lab sleep study. Approved HST. Order # [id number]. Valid dates 01/10/21 to 07/09/21. Ordering provider notified.

## 2021-01-10 NOTE — Telephone Encounter (Signed)
-----   Message from Hazle Quant, RN sent at 01/07/2021  2:26 PM EDT ----- Regarding: Please precert Please precert sleep study for excessive daytime sleepiness per Dr. Bing Matter.

## 2021-01-11 NOTE — Addendum Note (Signed)
Addended by: Hazle Quant on: 01/11/2021 04:21 PM   Modules accepted: Orders

## 2021-01-14 ENCOUNTER — Telehealth: Payer: Self-pay | Admitting: Emergency Medicine

## 2021-01-14 NOTE — Telephone Encounter (Signed)
Called and spoke to patient. Informed him of home sleep study appointment no further questions.

## 2021-01-17 ENCOUNTER — Ambulatory Visit: Payer: BC Managed Care – PPO | Admitting: Nurse Practitioner

## 2021-01-23 ENCOUNTER — Other Ambulatory Visit: Payer: Self-pay

## 2021-01-23 ENCOUNTER — Ambulatory Visit (HOSPITAL_BASED_OUTPATIENT_CLINIC_OR_DEPARTMENT_OTHER): Payer: BC Managed Care – PPO | Admitting: Cardiovascular Disease

## 2021-01-23 DIAGNOSIS — G4719 Other hypersomnia: Secondary | ICD-10-CM

## 2021-01-29 ENCOUNTER — Other Ambulatory Visit: Payer: Self-pay

## 2021-01-29 ENCOUNTER — Ambulatory Visit (INDEPENDENT_AMBULATORY_CARE_PROVIDER_SITE_OTHER): Payer: BC Managed Care – PPO

## 2021-01-29 ENCOUNTER — Ambulatory Visit (HOSPITAL_BASED_OUTPATIENT_CLINIC_OR_DEPARTMENT_OTHER): Payer: BC Managed Care – PPO | Admitting: Cardiovascular Disease

## 2021-01-29 DIAGNOSIS — I10 Essential (primary) hypertension: Secondary | ICD-10-CM | POA: Diagnosis not present

## 2021-01-29 DIAGNOSIS — E785 Hyperlipidemia, unspecified: Secondary | ICD-10-CM | POA: Diagnosis not present

## 2021-01-29 DIAGNOSIS — R0789 Other chest pain: Secondary | ICD-10-CM | POA: Diagnosis not present

## 2021-01-29 LAB — EXERCISE TOLERANCE TEST
Estimated workload: 11.6 METS
Exercise duration (min): 10 min
Exercise duration (sec): 0 s
MPHR: 180 {beats}/min
Peak HR: 164 {beats}/min
Percent HR: 91 %
RPE: 16
Rest HR: 80 {beats}/min

## 2021-01-31 ENCOUNTER — Other Ambulatory Visit: Payer: Self-pay

## 2021-01-31 ENCOUNTER — Ambulatory Visit (HOSPITAL_BASED_OUTPATIENT_CLINIC_OR_DEPARTMENT_OTHER): Payer: BC Managed Care – PPO | Attending: Cardiology | Admitting: Cardiovascular Disease

## 2021-01-31 DIAGNOSIS — G4719 Other hypersomnia: Secondary | ICD-10-CM | POA: Diagnosis not present

## 2021-01-31 DIAGNOSIS — G4733 Obstructive sleep apnea (adult) (pediatric): Secondary | ICD-10-CM

## 2021-02-01 ENCOUNTER — Telehealth: Payer: Self-pay

## 2021-02-01 NOTE — Telephone Encounter (Signed)
I called and the patient answered but he did not respond when I was trying to notify him of results. I did not get a response so I will continue to reach out another time.

## 2021-02-01 NOTE — Telephone Encounter (Signed)
-----   Message from Georgeanna Lea, MD sent at 01/30/2021  2:22 PM EDT ----- Stress test negative for exercise-induced myocardial ischemia

## 2021-02-04 NOTE — Telephone Encounter (Signed)
Patient is returning call.  °

## 2021-02-04 NOTE — Telephone Encounter (Signed)
Tried to call patient back. There was a answer and no voice again.

## 2021-02-17 ENCOUNTER — Encounter (HOSPITAL_BASED_OUTPATIENT_CLINIC_OR_DEPARTMENT_OTHER): Payer: Self-pay | Admitting: Cardiovascular Disease

## 2021-02-17 NOTE — Procedures (Signed)
     Patient Name: Brett Roberts, Brett Roberts Date: 02/02/2021 Gender: Male D.O.B: June 20, 1981 Age (years): 40 Referring Provider: Georgeanna Lea Height (inches): 71 Interpreting Physician: Nicki Guadalajara MD, ABSM Weight (lbs): 260 RPSGT: [place] BMI: 36 MRN: [id number] Neck Size: 17.50  CLINICAL INFORMATION Sleep Study Type: HST  Indication for sleep study: Excessive Daytime Sleepiness, snoring  Epworth Sleepiness Score: 9  SLEEP STUDY TECHNIQUE A multi-channel overnight portable sleep study was performed. The channels recorded were: nasal airflow, thoracic respiratory movement, and oxygen saturation with a pulse oximetry. Snoring was also monitored.  MEDICATIONS Patient self administered medications include: N/A.  SLEEP ARCHITECTURE Patient was studied for 350.2 minutes. The sleep efficiency was 100.0 % and the patient was supine for 14.5%. The arousal index was 0.0 per hour.  RESPIRATORY PARAMETERS The overall AHI was 14.0 per hour, with a central apnea index of 0 per hour.  The oxygen nadir was 87% during sleep.  CARDIAC DATA Mean heart rate during sleep was 56.3 bpm.  IMPRESSIONS - Mild obstructive sleep apnea occurred during this study (AHI 14.0/h). Events were worse with supine sleep (AHI 54.3/h) - Mild oxygen desaturation to a nadir of 87%. - Patient snored 49.7% during the sleep.  DIAGNOSIS - Obstructive Sleep Apnea (G47.33)  RECOMMENDATIONS - Therapeutic CPAP titration to determine optimal pressure required to alleviate sleep disordered breathing. If unableto obtain an in-lab titration recommend Auto PAP with EPR of 3 at 6-16 cm of water - Positional therapy avoiding supine position during sleep. - Avoid alcohol, sedatives and other CNS depressants that may worsen sleep apnea and disrupt normal sleep architecture. - Sleep hygiene should be reviewed to assess factors that may improve sleep quality. - Weight management (BMI 36) and regular exercise  should be initiated or continued. - Recommend a download after 30 days of therapy and sleep clinic evaluation.  [Electronically signed] 02/17/2021 06:05 PM  Nicki Guadalajara MD, [facility], ABSM Diplomate, American Board of Sleep Medicine   NPI: [id number]  [facility] PH: [phone]   FX: [phone] ACCREDITED BY THE AMERICAN ACADEMY OF SLEEP MEDICINE

## 2021-03-01 ENCOUNTER — Ambulatory Visit: Payer: BC Managed Care – PPO | Admitting: Cardiology

## 2021-03-01 ENCOUNTER — Encounter: Payer: Self-pay | Admitting: Cardiology

## 2021-03-01 ENCOUNTER — Other Ambulatory Visit: Payer: Self-pay

## 2021-03-01 VITALS — BP 128/92 | HR 74 | Ht 71.0 in | Wt 264.2 lb

## 2021-03-01 DIAGNOSIS — G4733 Obstructive sleep apnea (adult) (pediatric): Secondary | ICD-10-CM | POA: Diagnosis not present

## 2021-03-01 DIAGNOSIS — E785 Hyperlipidemia, unspecified: Secondary | ICD-10-CM

## 2021-03-01 DIAGNOSIS — R0789 Other chest pain: Secondary | ICD-10-CM

## 2021-03-01 DIAGNOSIS — I10 Essential (primary) hypertension: Secondary | ICD-10-CM

## 2021-03-01 DIAGNOSIS — Z8249 Family history of ischemic heart disease and other diseases of the circulatory system: Secondary | ICD-10-CM

## 2021-03-01 NOTE — Progress Notes (Signed)
Cardiology Office Note:    Date:  03/01/2021   ID:  [name], DOB 06/28/1981, MRN [id number]  PCP:  Blane Ohara, MD  Cardiologist:  Gypsy Balsam, MD    Referring MD: Blane Ohara, MD   Chief Complaint  Patient presents with   sleep study    History of Present Illness:    Brett Roberts is a 40 y.o. male with past medical history significant for essential hypertension, dyslipidemia, pulm a history of premature coronary artery disease, he was referred to [place] initially because of chest pain.  He did have a stress test he walked 10 minutes on the treadmill with no ST segment changes and no symptoms.  He comes today to discuss results of those tests.  He also got sleep study done with strong suspicion for obstructive sleep apnea.  Sleep study showed mild sleep apnea and titration study is being planned. Overall since I seen him last time he is doing very well.  He denies have any chest pain tightness squeezing pressure burning chest, he lost few pounds since have seen him last time.  Past Medical History:  Diagnosis Date   Acid reflux     Past Surgical History:  Procedure Laterality Date   NO PAST SURGERIES      Current Medications: Current Meds  Medication Sig   esomeprazole (NEXIUM) 40 MG capsule Take 1 capsule (40 mg total) by mouth 2 (two) times daily before a meal.   fluticasone (FLONASE) 50 MCG/ACT nasal spray Place 2 sprays into both nostrils daily.   rosuvastatin (CRESTOR) 10 MG tablet Take 10 mg by mouth daily.     Allergies:   Patient has no known allergies.   Social History   Socioeconomic History   Marital status: Married    Spouse name: Not on file   Number of children: Not on file   Years of education: Not on file   Highest education level: Not on file  Occupational History   Not on file  Tobacco Use   Smoking status: Never   Smokeless tobacco: Never  Substance and Sexual Activity   Alcohol use: Yes    Alcohol/week: 1.0 - 2.0 standard drink     Types: 1 - 2 Cans of beer per week    Comment: twice weekly    Drug use: Never   Sexual activity: Yes  Other Topics Concern   Not on file  Social History Narrative   Not on file   Social Determinants of Health   Financial Resource Strain: Not on file  Food Insecurity: Not on file  Transportation Needs: Not on file  Physical Activity: Not on file  Stress: Not on file  Social Connections: Not on file     Family History: The patient's family history includes Heart attack in his father; Hyperlipidemia in his brother; Hypertension in his mother and sister. ROS:   Please see the history of present illness.    All 14 point review of systems negative except as described per history of present illness  EKGs/Labs/Other Studies Reviewed:      Recent Labs: 01/04/2021: ALT 20; BUN 18; Creatinine, Ser 1.00; Hemoglobin 14.6; Platelets 249; Potassium 4.8; Sodium 140; TSH 1.580  Recent Lipid Panel    Component Value Date/Time   CHOL 229 (H) 01/04/2021 0822   TRIG 81 01/04/2021 0822   HDL 43 01/04/2021 0822   CHOLHDL 5.3 (H) 01/04/2021 0822   LDLCALC 172 (H) 01/04/2021 1324    Physical Exam:  VS:  BP (!) 128/92 (BP Location: Left Arm, Patient Position: Sitting)   Pulse 74   Ht 5\' 11"  (1.803 m)   Wt 264 lb 3.2 oz (119.8 kg)   SpO2 96%   BMI 36.85 kg/m     Wt Readings from Last 3 Encounters:  03/01/21 264 lb 3.2 oz (119.8 kg)  01/29/21 260 lb (117.9 kg)  01/07/21 265 lb (120.2 kg)     GEN:  Well nourished, well developed in no acute distress HEENT: Normal NECK: No JVD; No carotid bruits LYMPHATICS: No lymphadenopathy CARDIAC: RRR, no murmurs, no rubs, no gallops RESPIRATORY:  Clear to auscultation without rales, wheezing or rhonchi  ABDOMEN: Soft, non-tender, non-distended MUSCULOSKELETAL:  No edema; No deformity  SKIN: Warm and dry LOWER EXTREMITIES: no swelling NEUROLOGIC:  Alert and oriented x 3 PSYCHIATRIC:  Normal affect   ASSESSMENT:    1. Atypical chest  pain   2. Essential hypertension   3. Obstructive sleep apnea   4. Family history of premature CAD   5. Dyslipidemia    PLAN:    In order of problems listed above:  Atypical chest pain denies having any stress test negative decreased risk factors modifications Essential hypertension, well controlled we will continue present management. Obstructive sleep apnea: We discussed in details his study.  I recommended titration study however he prefers to work on his weight.  Apparently he is participating competition at work when they collected money until September they trying to lose weight however will lose more weight will be a weaning of those monitor.  And he already lost 8 pounds.  Surely I encouraged him to doing that clearly weight loss and management of sleep apnea by weight loss will be more beneficial for multiple point review therefore we will push towards that.  He does not want to have titration sleep study done yet.  However, I will see him after September to see how he does. Family history of premature coronary artery disease noted Dyslipidemia with LDL of 172.  He is on Crestor 10.  He is scheduled to see his primary care physician in 2 weeks to have fasting lipid profile redone   Medication Adjustments/Labs and Tests Ordered: Current medicines are reviewed at length with the patient today.  Concerns regarding medicines are outlined above.  No orders of the defined types were placed in this encounter.  Medication changes: No orders of the defined types were placed in this encounter.   Signed, October, MD, [facility] 03/01/2021 3:55 PM    [facility]

## 2021-03-01 NOTE — Patient Instructions (Signed)

## 2021-03-05 ENCOUNTER — Telehealth: Payer: Self-pay | Admitting: *Deleted

## 2021-03-05 ENCOUNTER — Other Ambulatory Visit: Payer: Self-pay | Admitting: Cardiovascular Disease

## 2021-03-05 DIAGNOSIS — G4733 Obstructive sleep apnea (adult) (pediatric): Secondary | ICD-10-CM

## 2021-03-05 NOTE — Telephone Encounter (Signed)
Patient notified of HST results and recommendations. He agrees to proceed with CPAP titration. PA request will be submitted to [facility]. Patient had no questions.

## 2021-03-05 NOTE — Telephone Encounter (Signed)
-----   Message from Lennette Bihari, MD sent at 02/17/2021  6:09 PM EDT ----- Burna Mortimer, please notify pt and schedule CPAP titration or Auto-PAP if denied

## 2021-03-08 ENCOUNTER — Telehealth: Payer: Self-pay | Admitting: *Deleted

## 2021-03-08 ENCOUNTER — Other Ambulatory Visit: Payer: Self-pay | Admitting: Family Medicine

## 2021-03-08 NOTE — Telephone Encounter (Signed)
PA request for CPAP titration was denied by [facility]. APAP order has been sent to [facility] in [place].

## 2021-03-08 NOTE — Telephone Encounter (Signed)
-----   Message from Gaynelle Cage, CMA sent at 03/05/2021  9:23 AM EDT ----- CPAP titration

## 2021-03-11 ENCOUNTER — Other Ambulatory Visit: Payer: Self-pay | Admitting: Nurse Practitioner

## 2021-03-11 ENCOUNTER — Encounter: Payer: Self-pay | Admitting: Nurse Practitioner

## 2021-03-11 ENCOUNTER — Ambulatory Visit: Payer: BC Managed Care – PPO | Admitting: Nurse Practitioner

## 2021-03-11 VITALS — BP 138/94 | HR 71 | Temp 97.4°F | Ht 71.0 in | Wt 263.0 lb

## 2021-03-11 DIAGNOSIS — J029 Acute pharyngitis, unspecified: Secondary | ICD-10-CM

## 2021-03-11 DIAGNOSIS — J00 Acute nasopharyngitis [common cold]: Secondary | ICD-10-CM

## 2021-03-11 DIAGNOSIS — K140 Glossitis: Secondary | ICD-10-CM

## 2021-03-11 DIAGNOSIS — J028 Acute pharyngitis due to other specified organisms: Secondary | ICD-10-CM | POA: Diagnosis not present

## 2021-03-11 DIAGNOSIS — R0981 Nasal congestion: Secondary | ICD-10-CM

## 2021-03-11 DIAGNOSIS — J309 Allergic rhinitis, unspecified: Secondary | ICD-10-CM

## 2021-03-11 LAB — POCT RAPID STREP A: Strep A Ag: NOT DETECTED

## 2021-03-11 LAB — POC COVID19 BINAXNOW: SARS Coronavirus 2 Ag: NEGATIVE

## 2021-03-11 MED ORDER — MAGIC MOUTHWASH: 5.0000 mL | Freq: Four times a day (QID) | ORAL | 0 refills | Status: DC | PRN

## 2021-03-11 MED ORDER — AMOXICILLIN 875 MG PO TABS: 875.0000 mg | ORAL_TABLET | Freq: Two times a day (BID) | ORAL | 0 refills | Status: AC

## 2021-03-11 NOTE — Progress Notes (Signed)
Acute Office Visit  Subjective:    Patient ID: Brett Roberts, male    DOB: May 11, 1981, 40 y.o.   MRN: [id number]  Chief Complaint  Patient presents with   Sore Throat     HPI Brett Roberts is a 40 year old Caucasian male that presents with sore throat, sinus congestion, and oral lesions. Onset of symptoms was 3-days-ago. Treatment has included Aleve, Ibuprofen, and Tylenol. He has also performed warm salt water gargles. He denies known exposure to ill-contacts.    Past Medical History:  Diagnosis Date   Acid reflux     Past Surgical History:  Procedure Laterality Date   NO PAST SURGERIES      Family History  Problem Relation Age of Onset   Hypertension Mother    Heart attack Father    Hypertension Sister    Hyperlipidemia Brother     Social History   Socioeconomic History   Marital status: Married    Spouse name: Not on file   Number of children: Not on file   Years of education: Not on file   Highest education level: Not on file  Occupational History   Not on file  Tobacco Use   Smoking status: Never   Smokeless tobacco: Never  Substance and Sexual Activity   Alcohol use: Yes    Alcohol/week: 1.0 - 2.0 standard drink    Types: 1 - 2 Cans of beer per week    Comment: twice weekly    Drug use: Never   Sexual activity: Yes  Other Topics Concern   Not on file  Social History Narrative   Not on file   Social Determinants of Health   Financial Resource Strain: Not on file  Food Insecurity: Not on file  Transportation Needs: Not on file  Physical Activity: Not on file  Stress: Not on file  Social Connections: Not on file  Intimate Partner Violence: Not on file    Outpatient Medications Prior to Visit  Medication Sig Dispense Refill   esomeprazole (NEXIUM) 40 MG capsule Take 1 capsule (40 mg total) by mouth 2 (two) times daily before a meal. 60 capsule 3   fluticasone (FLONASE) 50 MCG/ACT nasal spray Place 2 sprays into both nostrils daily. 16 g 6    rosuvastatin (CRESTOR) 10 MG tablet Take 10 mg by mouth daily.     No facility-administered medications prior to visit.    No Known Allergies  Review of Systems  Constitutional:  Positive for appetite change (decreased) and fatigue. Negative for fever.  HENT:  Positive for congestion, mouth sores, postnasal drip, rhinorrhea, sinus pressure and sore throat. Negative for ear pain.        Oral lesions  Eyes: Negative.   Respiratory:  Negative for cough and shortness of breath.   Cardiovascular:  Negative for chest pain and leg swelling.  Gastrointestinal:  Negative for abdominal pain, constipation, diarrhea, nausea and vomiting.  Endocrine: Negative.   Genitourinary:  Negative for dysuria and frequency.  Musculoskeletal:  Negative for arthralgias and myalgias.  Skin: Negative.   Allergic/Immunologic: Positive for environmental allergies.  Neurological:  Negative for dizziness and headaches.  Hematological: Negative.   Psychiatric/Behavioral:  Negative for dysphoric mood. The patient is not nervous/anxious.       Objective:    Physical Exam Vitals reviewed.  Constitutional:      Appearance: Normal appearance. He is well-developed.  HENT:     Right Ear: External ear normal. Tenderness present. Tympanic membrane is erythematous.  Left Ear: Tympanic membrane and external ear normal. Tenderness present.     Nose: Congestion and rhinorrhea present.     Mouth/Throat:     Mouth: Mucous membranes are moist. Oral lesions present.     Pharynx: Posterior oropharyngeal erythema present.  Eyes:     Pupils: Pupils are equal, round, and reactive to light.  Cardiovascular:     Rate and Rhythm: Normal rate and regular rhythm.     Pulses: Normal pulses.     Heart sounds: Normal heart sounds.  Pulmonary:     Effort: Pulmonary effort is normal.     Breath sounds: Normal breath sounds.  Abdominal:     General: Abdomen is flat. Bowel sounds are normal.     Palpations: Abdomen is soft.   Musculoskeletal:        General: Normal range of motion.     Cervical back: Normal range of motion.  Lymphadenopathy:     Cervical: Cervical adenopathy present.  Skin:    General: Skin is warm and dry.     Capillary Refill: Capillary refill takes less than 2 seconds.  Neurological:     General: No focal deficit present.     Mental Status: He is alert and oriented to person, place, and time.  Psychiatric:        Mood and Affect: Mood normal.        Behavior: Behavior normal.    BP (!) 138/94 (BP Location: Left Arm, Patient Position: Sitting)   Pulse 71   Temp (!) 97.4 F (36.3 C) (Temporal)   Ht '5\' 11"'  (1.803 m)   Wt 263 lb (119.3 kg)   SpO2 98%   BMI 36.68 kg/m  Wt Readings from Last 3 Encounters:  03/11/21 263 lb (119.3 kg)  03/01/21 264 lb 3.2 oz (119.8 kg)  01/29/21 260 lb (117.9 kg)    Health Maintenance Due  Topic Date Due   COVID-19 Vaccine (1) Never done   HIV Screening  Never done   Hepatitis C Screening  Never done   TETANUS/TDAP  Never done     Lab Results  Component Value Date   TSH 1.580 01/04/2021   Lab Results  Component Value Date   WBC 6.1 01/04/2021   HGB 14.6 01/04/2021   HCT 43.0 01/04/2021   MCV 87 01/04/2021   PLT 249 01/04/2021   Lab Results  Component Value Date   NA 140 01/04/2021   K 4.8 01/04/2021   CO2 23 01/04/2021   GLUCOSE 97 01/04/2021   BUN 18 01/04/2021   CREATININE 1.00 01/04/2021   BILITOT 0.3 01/04/2021   ALKPHOS 96 01/04/2021   AST 13 01/04/2021   ALT 20 01/04/2021   PROT 7.4 01/04/2021   ALBUMIN 4.5 01/04/2021   CALCIUM 9.3 01/04/2021   ANIONGAP 7 04/21/2020   EGFR 98 01/04/2021   Lab Results  Component Value Date   CHOL 229 (H) 01/04/2021   Lab Results  Component Value Date   HDL 43 01/04/2021   Lab Results  Component Value Date   LDLCALC 172 (H) 01/04/2021   Lab Results  Component Value Date   TRIG 81 01/04/2021   Lab Results  Component Value Date   CHOLHDL 5.3 (H) 01/04/2021           Assessment & Plan:   1. Rhinopharyngitis - amoxicillin (AMOXIL) 875 MG tablet; Take 1 tablet (875 mg total) by mouth 2 (two) times daily for 10 days.  Dispense: 20 tablet; Refill: 0  2. Pharyngitis due to other organism - amoxicillin (AMOXIL) 875 MG tablet; Take 1 tablet (875 mg total) by mouth 2 (two) times daily for 10 days.  Dispense: 20 tablet; Refill: 0  3. Sore throat - POC COVID-19 BinaxNow-Negative - POCT Rapid Strep A-Negative  4. Nasal congestion - POC COVID-19 BinaxNow-Negative  5. Tongue ulcer - magic mouthwash SOLN; Take 5 mLs by mouth 4 (four) times daily as needed for mouth pain.  Dispense: 120 mL; Refill: 0   Rest and push fluids Continue NSAIDs and Tylenol as directed Continue Flonase and Claritin as directed Use Magic Mouthwash 4 times daily as needed Avoid acidic foods Continue warm salt water gargles and throat lozenges as needed Replace toothbrush and toothpaste Notify office if symptoms worsen or fail to improve   I,Lauren M Auman,acting as a Education administrator for [employer], NP.,have documented all relevant documentation on the behalf of Rip Harbour, NP,as directed by  Rip Harbour, NP while in the presence of Rip Harbour, NP.   I, Rip Harbour, NP, have reviewed all documentation for this visit. The documentation on 03/11/21 for the exam, diagnosis, procedures, and orders are all accurate and complete.    Follow-up:PRN   Signed, Jerrell Belfast, DNP

## 2021-03-12 ENCOUNTER — Other Ambulatory Visit: Payer: Self-pay

## 2021-03-18 DIAGNOSIS — L578 Other skin changes due to chronic exposure to nonionizing radiation: Secondary | ICD-10-CM | POA: Diagnosis not present

## 2021-03-18 DIAGNOSIS — D1801 Hemangioma of skin and subcutaneous tissue: Secondary | ICD-10-CM | POA: Diagnosis not present

## 2021-03-18 DIAGNOSIS — L821 Other seborrheic keratosis: Secondary | ICD-10-CM | POA: Diagnosis not present

## 2021-03-23 IMAGING — CR DG CHEST 2V
2 series · 2 of 2 positions shown · non-contrast
Comparison: None

CLINICAL DATA: Central to LEFT chest discomfort and tenderness
since waking this morning

EXAM:
CHEST - 2 VIEW

[chest pa]
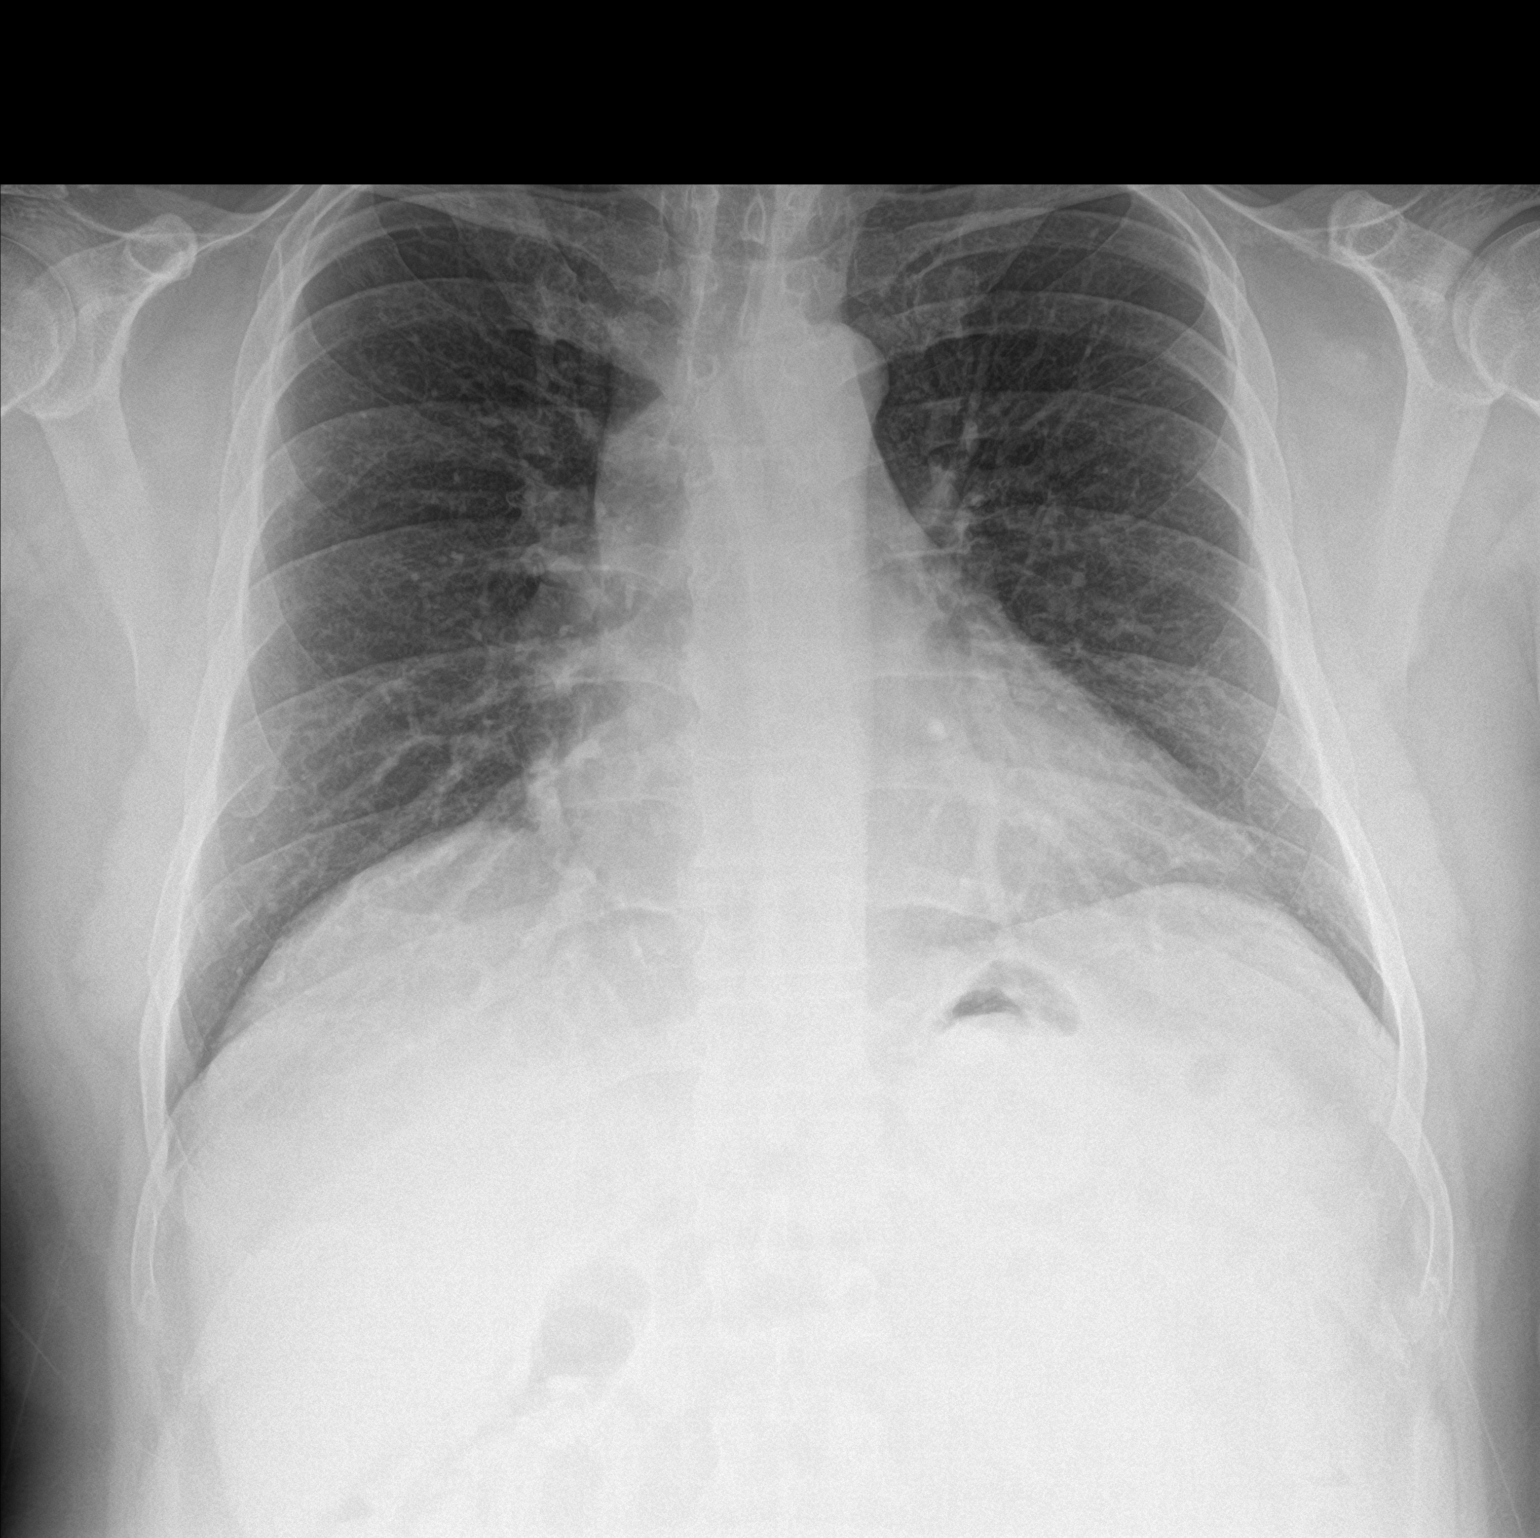

[chest lat]
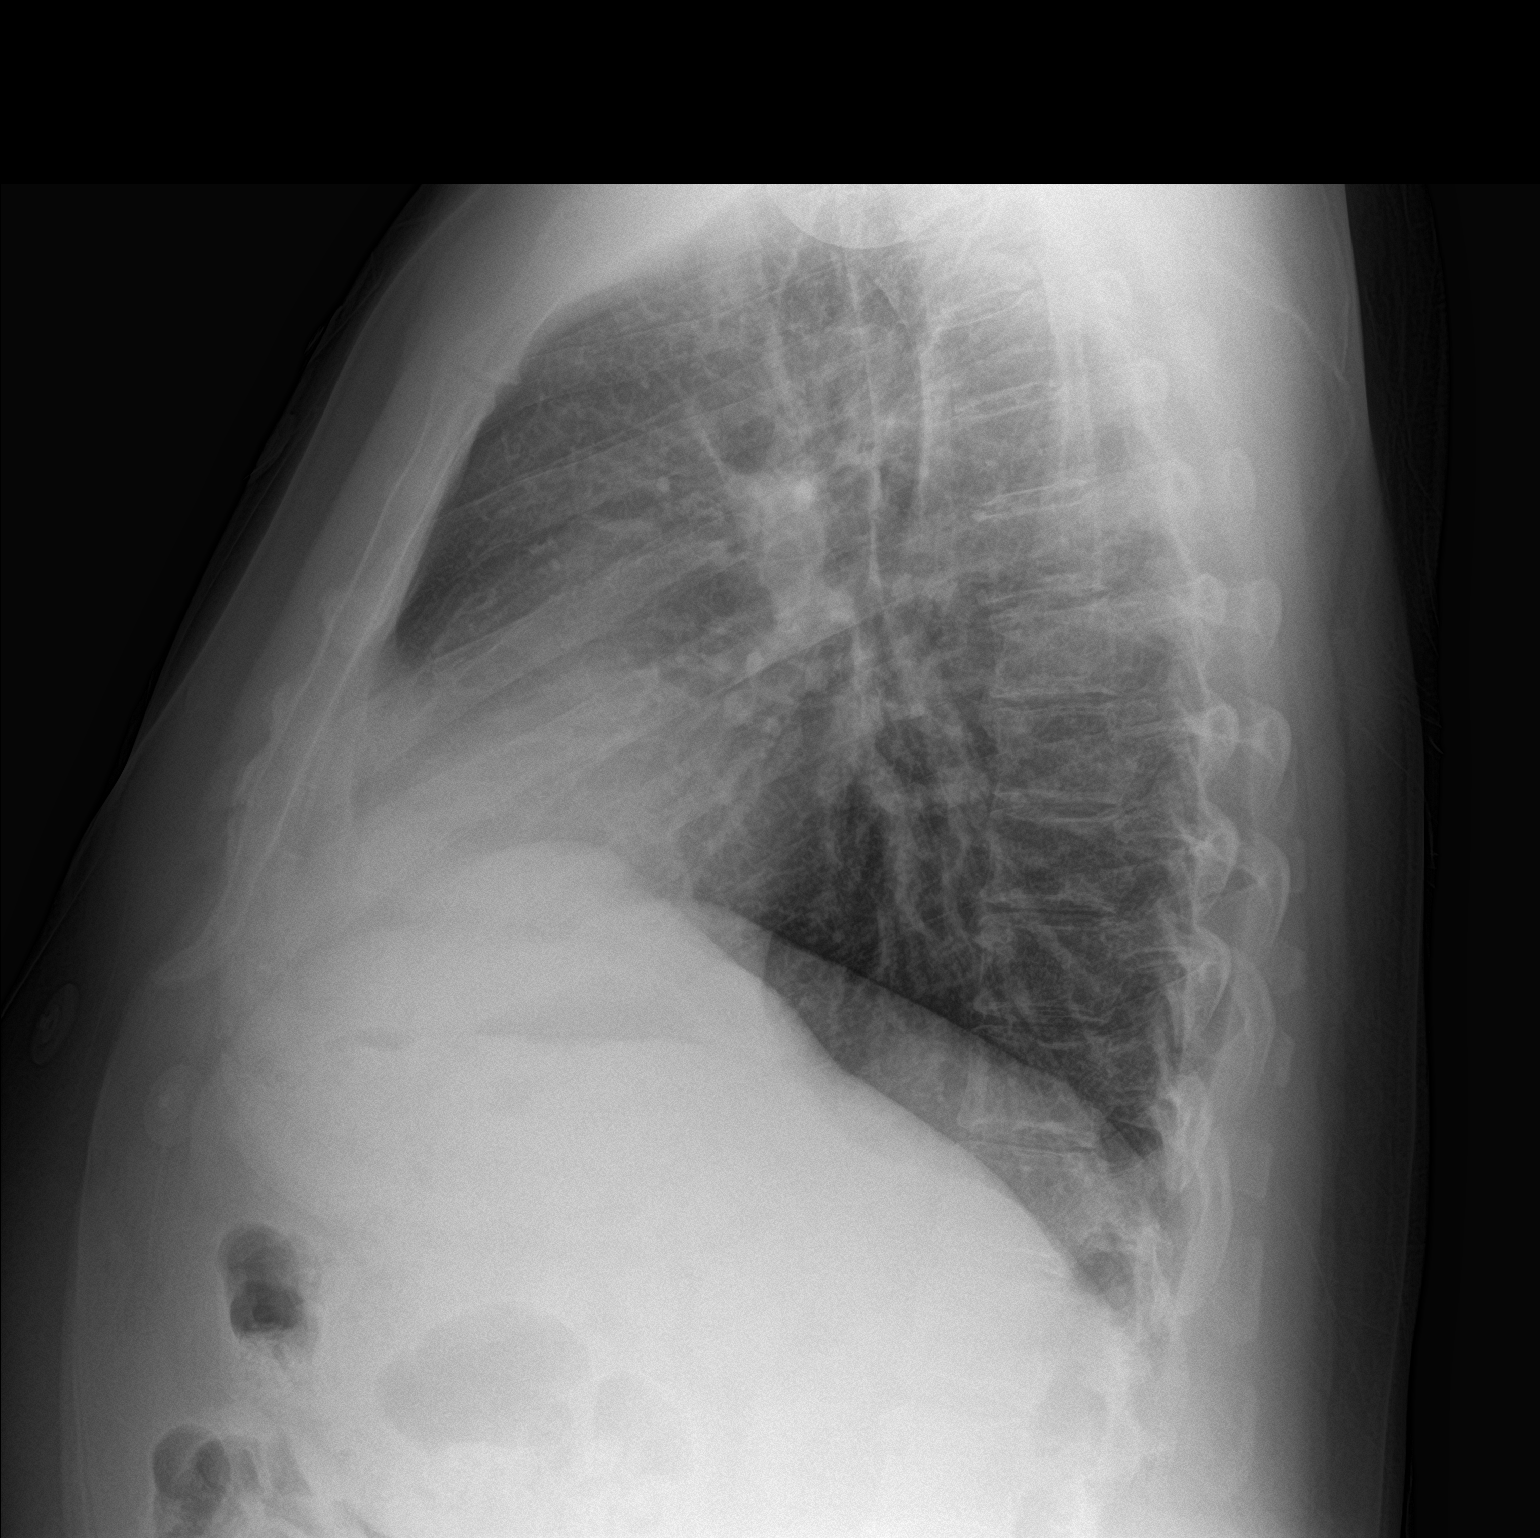

[2 of 2 positions shown; findings below may reference images not displayed]

FINDINGS: Upper normal heart size.

Mediastinal contours and pulmonary vascularity normal.

Lungs clear.

No infiltrate, pleural effusion, or pneumothorax.

Osseous structures unremarkable.
IMPRESSION: No acute abnormalities.

## 2021-04-04 ENCOUNTER — Ambulatory Visit: Payer: BC Managed Care – PPO | Admitting: Nurse Practitioner

## 2021-04-04 ENCOUNTER — Encounter: Payer: Self-pay | Admitting: Nurse Practitioner

## 2021-04-04 VITALS — BP 132/68 | HR 84 | Temp 97.8°F | Ht 71.0 in | Wt 253.0 lb

## 2021-04-04 DIAGNOSIS — R7301 Impaired fasting glucose: Secondary | ICD-10-CM

## 2021-04-04 DIAGNOSIS — Z8619 Personal history of other infectious and parasitic diseases: Secondary | ICD-10-CM

## 2021-04-04 DIAGNOSIS — E782 Mixed hyperlipidemia: Secondary | ICD-10-CM

## 2021-04-04 DIAGNOSIS — J028 Acute pharyngitis due to other specified organisms: Secondary | ICD-10-CM

## 2021-04-04 DIAGNOSIS — Z8249 Family history of ischemic heart disease and other diseases of the circulatory system: Secondary | ICD-10-CM

## 2021-04-04 DIAGNOSIS — Z6835 Body mass index (BMI) 35.0-35.9, adult: Secondary | ICD-10-CM

## 2021-04-04 DIAGNOSIS — G4733 Obstructive sleep apnea (adult) (pediatric): Secondary | ICD-10-CM

## 2021-04-04 DIAGNOSIS — I10 Essential (primary) hypertension: Secondary | ICD-10-CM

## 2021-04-04 LAB — POC COVID19 BINAXNOW: SARS Coronavirus 2 Ag: NEGATIVE

## 2021-04-04 LAB — POCT RAPID STREP A (OFFICE): Rapid Strep A Screen: NEGATIVE

## 2021-04-04 MED ORDER — SEMAGLUTIDE-WEIGHT MANAGEMENT 0.25 MG/0.5ML ~~LOC~~ SOAJ: 0.2500 mg | SUBCUTANEOUS | 1 refills | Status: DC

## 2021-04-04 MED ORDER — FLUCONAZOLE 150 MG PO TABS: 150.0000 mg | ORAL_TABLET | Freq: Once | ORAL | 0 refills | Status: AC

## 2021-04-04 MED ORDER — OZEMPIC (0.25 OR 0.5 MG/DOSE) 2 MG/1.5ML ~~LOC~~ SOPN: 0.5000 mg | PEN_INJECTOR | SUBCUTANEOUS | 1 refills | Status: DC

## 2021-04-04 MED ORDER — SEMAGLUTIDE-WEIGHT MANAGEMENT 1.7 MG/0.75ML ~~LOC~~ SOAJ: 1.7000 mg | SUBCUTANEOUS | 0 refills | Status: DC

## 2021-04-04 MED ORDER — SEMAGLUTIDE-WEIGHT MANAGEMENT 1 MG/0.5ML ~~LOC~~ SOAJ: 1.0000 mg | SUBCUTANEOUS | 0 refills | Status: DC

## 2021-04-04 MED ORDER — SEMAGLUTIDE-WEIGHT MANAGEMENT 0.5 MG/0.5ML ~~LOC~~ SOAJ: 0.5000 mg | SUBCUTANEOUS | 0 refills | Status: DC

## 2021-04-04 MED ORDER — AMOXICILLIN-POT CLAVULANATE 875-125 MG PO TABS: 1.0000 | ORAL_TABLET | Freq: Two times a day (BID) | ORAL | 0 refills | Status: DC

## 2021-04-04 NOTE — Progress Notes (Signed)
Subjective:  Patient ID: Brett Roberts, male    DOB: Jan 17, 1981  Age: 40 y.o. MRN: [id number]  Chief Complaint  Patient presents with   Discuss WT loss   Sore Throat    HPI: Brett Roberts is a 40 year old Caucasian male that presents with recurrent sore throat and to discuss weight management. Brett Roberts was treated with a course of antibiotics on 03/11/21 for pharyngitis. Pt states he improved initially but sore throat, tonsillar hypertrophy, and left cervical neck lymphadenopathy. He tells me that several years ago a tonsillectomy was recommended by a health care provider. He denies known illness with family members or co-workers.   Brett Roberts is interested in weight management. Current BMI 35.29. He was recently diagnosed with hypertension, hyperlipidemia, and obstructive sleep apnea. He has chronic fatigue. States he's gained approximately 40 pounds after changing jobs a short time ago. Prior to changing jobs,he put himself through [employer] and was able to complete the required physical requirements without difficulty. Brett Roberts tells me that he has a family history of premature coronary artery disease. He states he tried phentermine in the past but was unable to tolerate side effects of palpitations and jitteriness.       Current Outpatient Medications on File Prior to Visit  Medication Sig Dispense Refill   esomeprazole (NEXIUM) 40 MG capsule Take 1 capsule (40 mg total) by mouth 2 (two) times daily before a meal. 60 capsule 3   fluticasone (FLONASE) 50 MCG/ACT nasal spray Place 2 sprays into both nostrils daily. 16 g 6   magic mouthwash SOLN Take 5 mLs by mouth 4 (four) times daily as needed for mouth pain. 120 mL 0   rosuvastatin (CRESTOR) 10 MG tablet Take 1 tablet (10 mg total) by mouth daily. 30 tablet 1   No current facility-administered medications on file prior to visit.   Past Medical History:  Diagnosis Date   Acid reflux    Past Surgical History:  Procedure  Laterality Date   NO PAST SURGERIES      Family History  Problem Relation Age of Onset   Hypertension Mother    Heart attack Father    Hypertension Sister    Hyperlipidemia Brother    Social History   Socioeconomic History   Marital status: Married    Spouse name: Not on file   Number of children: Not on file   Years of education: Not on file   Highest education level: Not on file  Occupational History   Not on file  Tobacco Use   Smoking status: Never   Smokeless tobacco: Never  Substance and Sexual Activity   Alcohol use: Yes    Alcohol/week: 1.0 - 2.0 standard drink    Types: 1 - 2 Cans of beer per week    Comment: twice weekly    Drug use: Never   Sexual activity: Yes  Other Topics Concern   Not on file  Social History Narrative   Not on file   Social Determinants of Health   Financial Resource Strain: Not on file  Food Insecurity: Not on file  Transportation Needs: Not on file  Physical Activity: Not on file  Stress: Not on file  Social Connections: Not on file    Review of Systems  Constitutional:  Positive for fatigue. Negative for appetite change and fever. Unexpected weight change: weight gain. HENT:  Positive for sore throat and voice change ("garbled" speech due to sore throat). Negative for congestion and ear pain.  Eyes: Negative.   Respiratory:  Negative for cough and shortness of breath.   Cardiovascular:  Negative for chest pain and leg swelling.  Gastrointestinal:  Negative for abdominal pain, constipation, diarrhea, nausea and vomiting.       GERD  Endocrine: Negative.   Genitourinary:  Negative for dysuria and frequency.  Musculoskeletal:  Negative for arthralgias and myalgias.  Skin: Negative.   Allergic/Immunologic: Positive for environmental allergies.  Neurological:  Negative for dizziness and headaches.  Hematological:  Positive for adenopathy.  Psychiatric/Behavioral:  Negative for dysphoric mood. The patient is not  nervous/anxious.     Objective:  BP 132/68 (BP Location: Left Arm, Patient Position: Sitting)   Pulse 84   Temp 97.8 F (36.6 C) (Temporal)   Ht 5\' 11"  (1.803 m)   Wt 253 lb (114.8 kg)   SpO2 96%   BMI 35.29 kg/m   BP/Weight 04/04/2021 03/11/2021 03/01/2021  Systolic BP 132 138 128  Diastolic BP 68 94 92  Wt. (Lbs) 253 263 264.2  BMI 35.29 36.68 36.85    Physical Exam Vitals reviewed.  Constitutional:      Appearance: Normal appearance. He is well-developed.  HENT:     Right Ear: Tympanic membrane and external ear normal.     Left Ear: Tympanic membrane and external ear normal.     Nose: Congestion present.     Mouth/Throat:     Mouth: Mucous membranes are moist.     Pharynx: Pharyngeal swelling, oropharyngeal exudate, posterior oropharyngeal erythema and uvula swelling present.     Tonsils: Tonsillar exudate present. 2+ on the right. 2+ on the left.  Eyes:     Pupils: Pupils are equal, round, and reactive to light.  Neck:     Comments: Left tonsillar cervical adenopathy Cardiovascular:     Rate and Rhythm: Normal rate and regular rhythm.     Pulses: Normal pulses.     Heart sounds: Normal heart sounds.  Pulmonary:     Effort: Pulmonary effort is normal.     Breath sounds: Normal breath sounds.  Abdominal:     General: Abdomen is flat. Bowel sounds are normal.     Palpations: Abdomen is soft.  Musculoskeletal:        General: Normal range of motion.     Cervical back: Normal range of motion and neck supple.  Lymphadenopathy:     Cervical: Cervical adenopathy present.  Skin:    General: Skin is warm and dry.     Capillary Refill: Capillary refill takes less than 2 seconds.  Neurological:     Mental Status: He is alert and oriented to person, place, and time.  Psychiatric:        Mood and Affect: Mood normal.        Behavior: Behavior normal.        Lab Results  Component Value Date   WBC 6.1 01/04/2021   HGB 14.6 01/04/2021   HCT 43.0 01/04/2021   PLT  249 01/04/2021   GLUCOSE 97 01/04/2021   CHOL 229 (H) 01/04/2021   TRIG 81 01/04/2021   HDL 43 01/04/2021   LDLCALC 172 (H) 01/04/2021   ALT 20 01/04/2021   AST 13 01/04/2021   NA 140 01/04/2021   K 4.8 01/04/2021   CL 103 01/04/2021   CREATININE 1.00 01/04/2021   BUN 18 01/04/2021   CO2 23 01/04/2021   TSH 1.580 01/04/2021      Assessment & Plan:    1. Pharyngitis due to other  organism - amoxicillin-clavulanate (AUGMENTIN) 875-125 MG tablet; Take 1 tablet by mouth 2 (two) times daily.  Dispense: 20 tablet; Refill: 0 - Mononucleosis screen - POC COVID-19 BinaxNow - POCT rapid strep A  2. History of candidiasis of mouth - fluconazole (DIFLUCAN) 150 MG tablet; Take 1 tablet (150 mg total) by mouth once for 1 dose.  Dispense: 1 tablet; Refill: 0  3. Impaired fasting glucose - Semaglutide,0.25 or 0.5MG /DOS, (OZEMPIC, 0.25 OR 0.5 MG/DOSE,) 2 MG/1.5ML SOPN; Inject 0.5 mg into the skin once a week.  Dispense: 1.5 mL; Refill: 1  4. Mixed hyperlipidemia - Semaglutide,0.25 or 0.5MG /DOS, (OZEMPIC, 0.25 OR 0.5 MG/DOSE,) 2 MG/1.5ML SOPN; Inject 0.5 mg into the skin once a week.  Dispense: 1.5 mL; Refill: 1 -Continue Crestor 10 mg daily  5. Essential hypertension - Semaglutide,0.25 or 0.5MG /DOS, (OZEMPIC, 0.25 OR 0.5 MG/DOSE,) 2 MG/1.5ML SOPN; Inject 0.5 mg into the skin once a week.  Dispense: 1.5 mL; Refill: 1 -Heart healthy diet -Increase physical activity -Monitor BP, keep log  6. OSA (obstructive sleep apnea) - CPAP titration denied by [employer], awaiting Auto-PAP machine from [facility]  -continue follow-up with Dr Tresa Endo as scheduled  7. BMI 35.0-35.9,adult - Semaglutide,0.25 or 0.5MG /DOS, (OZEMPIC, 0.25 OR 0.5 MG/DOSE,) 2 MG/1.5ML SOPN; Inject 0.5 mg into the skin once a week.  Dispense: 1.5 mL; Refill: 1  8. Family history of premature CAD - Semaglutide,0.25 or 0.5MG /DOS, (OZEMPIC, 0.25 OR 0.5 MG/DOSE,) 2 MG/1.5ML SOPN; Inject 0.5 mg into the skin once a week.   Dispense: 1.5 mL; Refill: 1    Begin Wegovy 0.25 mg weekly injection for 4 weeks; then increase to 0.5 mg weekly 4 weeks; then 1 mg weekly for 4 weeks then titrate up as scheduled Take Augmentin twice daily for 10 days Take Diflucan 150 mg once tablet Warm salt water gargles, throat lozenges We will call you with mono test results       Follow-up in 8-weeks  Follow-up:   An After Visit Summary was printed and given to the patient.  I, Janie Morning, NP, have reviewed all documentation for this visit. The documentation on 04/07/21 for the exam, diagnosis, procedures, and orders are all accurate and complete.    I,Lauren M Auman,acting as a Neurosurgeon for [employer], NP.,have documented all relevant documentation on the behalf of Janie Morning, NP,as directed by  Janie Morning, NP while in the presence of Janie Morning, NP.    Janie Morning, NP [name] [phone]

## 2021-04-04 NOTE — Patient Instructions (Addendum)
Begin Wegovy 0.25 mg weekly injection for 4 weeks; then increase to 0.5 mg weekly 4 weeks; then 1 mg weekly for 4 weeks then titrate up as scheduled Take Augmentin twice daily for 10 days Take Diflucan 150 mg once tablet Warm salt water gargles, throat lozenges We will call you with mono test results Follow-up in 8-weeks    How to Increase Your Level of Physical Activity Getting regular physical activity is important for your overall health and well-being. Most people do not get enough exercise. There are easy ways to increase your level of physical activity, even if you have not been very activein the past or if you are just starting out. How can increasing my physical activity affect me? Physical activity has many short-term and long-term benefits. Being active on a regular basis can improve your physical and mental health as well as provideother benefits. Physical health benefits Helping you lose weight or maintain a healthy weight. Strengthening your muscles and bones. Reducing your risk of certain long-term (chronic) diseases, including heart disease, cancer, and diabetes. Being able to move around more easily and for longer periods of time without getting tired (increased stamina). Improving your ability to fight off illness (enhanced immunity). Being able to sleep better. Helping you stay healthy as you get older, including: Helping you stay mobile, or capable of walking and moving around. Preventing accidents, such as falls. Increasing life expectancy. Mental health benefits Boosting your mood and improving your self-esteem. Lowering your chance of having mental health problems, such as depression or anxiety. Helping you feel good about your body. Other benefits Finding new sources of fun and enjoyment. Meeting new people who share a common interest. What steps can I take to be more physically active? Getting started If you have a chronic illness or have not been active for a  while, check with your health care provider about how to get started. Ask your health care provider what activities are safe for you. Start out slowly. Walking or doing some simple chair exercises is a good place to start, especially if you have not been active before or for a long time. Set goals that you can work toward. Ask your health care provider how much exercise is best for you. In general, most adults should: Do moderate-intensity exercise for at least 150 minutes each week (30 minutes on most days of the week) or vigorous exercise for at least 75 minutes each week, or a combination of these. Moderate-intensity exercise can include walking at a quick pace, biking, yoga, water aerobics, or gardening. Vigorous exercise involves activities that take more effort, such as jogging or running, playing sports, swimming laps, or jumping rope. Do strength exercises on at least 2 days each week. This can include weight lifting, body weight exercises, and resistance-band exercises. Consider using a fitness tracker, such as a mobile phone app or a device worn like a watch, that will count the number of steps you take each day. Many people strive to reach 10,000 steps a day. Choosing activities Try to find activities that you enjoy. You are more likely to commit to an exercise routine if it does not feel like a chore. If you have bone or joint problems, choose low-impact exercises, like walking or swimming. Use these tips for being successful with an exercise plan: Find a workout partner for accountability. Join a group or class, such as an aerobics class, cycling class, or sports team. Make family time active. Go for a walk, bike, or  swim. Include a variety of exercises each week. Being active in your daily routines Besides your formal exercise plans, you can find ways to do physical activity during your daily routines, such as: Walking or biking to work or to the store. Taking the stairs instead of  the elevator. Parking farther away from the door at work or at the store. Planning walking meetings. Walking around while you are on the phone.  Where to find more information Centers for Disease Control and Prevention: CampusCasting.com.ptwww.cdc.gov/physicalactivity President's Council on Fitness, Sports & Nutrition: www.fitness.gov ChooseMyPlate: https://ball-collins.biz/www.choosemyplate.gov Contact a health care provider if: You have headaches, muscle aches, or joint pain. You feel dizzy or light-headed while exercising. You faint. You have chest pain while exercising. Summary Exercise benefits your mind and body at any age, even if you are just starting out. If you have a chronic illness or have not been active for a while, check with your health care provider before increasing your physical activity. Choose activities that are safe and enjoyable for you. Ask your health care provider what activities are safe for you. Start slowly. Tell your health care provider if you have problems as you start to increase your activity level. This information is not intended to replace advice given to you by your health care provider. Make sure you discuss any questions you have with your healthcare provider. Document Revised: 07/25/2020 Document Reviewed: 03/07/2019 Elsevier Patient Education  2022 Elsevier Inc. Calorie Counting for [employer] InternationalWeight Loss Calories are units of energy. Your body needs a certain number of calories from food to keep going throughout the day. When you eat or drink more calories than your body needs, your body stores the extra calories mostly as fat. When you eat or drink fewer calories than your body needs, your body burns fat to getthe energy it needs. Calorie counting means keeping track of how many calories you eat and drink each day. Calorie counting can be helpful if you need to lose weight. If you eat fewer calories than your body needs, you should lose weight. Ask yourhealth care provider what a healthy weight is for  you. For calorie counting to work, you will need to eat the right number of calories each day to lose a healthy amount of weight per week. A dietitian can help you figure out how many calories you need in a day and will suggest ways to reach your calorie goal. A healthy amount of weight to lose each week is usually 1-2 lb (0.5-0.9 kg). This usually means that your daily calorie intake should be reduced by 500-750 calories. Eating 1,200-1,500 calories a day can help most women lose weight. Eating 1,500-1,800 calories a day can help most men lose weight. What do I need to know about calorie counting? Work with your health care provider or dietitian to determine how many calories you should get each day. To meet your daily calorie goal, you will need to: Find out how many calories are in each food that you would like to eat. Try to do this before you eat. Decide how much of the food you plan to eat. Keep a food log. Do this by writing down what you ate and how many calories it had. To successfully lose weight, it is important to balance calorie counting with ahealthy lifestyle that includes regular activity. Where do I find calorie information?  The number of calories in a food can be found on a Nutrition Facts label. If a food does not have a Nutrition  Facts label, try to look up the calories onlineor ask your dietitian for help. Remember that calories are listed per serving. If you choose to have more than one serving of a food, you will have to multiply the calories per serving by the number of servings you plan to eat. For example, the label on a package of bread might say that a serving size is 1 slice and that there are 90 calories in a serving. If you eat 1 slice, you will have eaten 90 calories. If you eat 2slices, you will have eaten 180 calories. How do I keep a food log? After each time that you eat, record the following in your food log as soon as possible: What you ate. Be sure to include  toppings, sauces, and other extras on the food. How much you ate. This can be measured in cups, ounces, or number of items. How many calories were in each food and drink. The total number of calories in the food you ate. Keep your food log near you, such as in a pocket-sized notebook or on an app or website on your mobile phone. Some programs will calculate calories for you andshow you how many calories you have left to meet your daily goal. What are some portion-control tips? Know how many calories are in a serving. This will help you know how many servings you can have of a certain food. Use a measuring cup to measure serving sizes. You could also try weighing out portions on a kitchen scale. With time, you will be able to estimate serving sizes for some foods. Take time to put servings of different foods on your favorite plates or in your favorite bowls and cups so you know what a serving looks like. Try not to eat straight from a food's packaging, such as from a bag or box. Eating straight from the package makes it hard to see how much you are eating and can lead to overeating. Put the amount you would like to eat in a cup or on a plate to make sure you are eating the right portion. Use smaller plates, glasses, and bowls for smaller portions and to prevent overeating. Try not to multitask. For example, avoid watching TV or using your computer while eating. If it is time to eat, sit down at a table and enjoy your food. This will help you recognize when you are full. It will also help you be more mindful of what and how much you are eating. What are tips for following this plan? Reading food labels Check the calorie count compared with the serving size. The serving size may be smaller than what you are used to eating. Check the source of the calories. Try to choose foods that are high in protein, fiber, and vitamins, and low in saturated fat, trans fat, and sodium. Shopping Read nutrition labels  while you shop. This will help you make healthy decisions about which foods to buy. Pay attention to nutrition labels for low-fat or fat-free foods. These foods sometimes have the same number of calories or more calories than the full-fat versions. They also often have added sugar, starch, or salt to make up for flavor that was removed with the fat. Make a grocery list of lower-calorie foods and stick to it. Cooking Try to cook your favorite foods in a healthier way. For example, try baking instead of frying. Use low-fat dairy products. Meal planning Use more fruits and vegetables. One-half of your plate should  be fruits and vegetables. Include lean proteins, such as chicken, [place], and fish. Lifestyle Each week, aim to do one of the following: 150 minutes of moderate exercise, such as walking. 75 minutes of vigorous exercise, such as running. General information Know how many calories are in the foods you eat most often. This will help you calculate calorie counts faster. Find a way of tracking calories that works for you. Get creative. Try different apps or programs if writing down calories does not work for you. What foods should I eat?  Eat nutritious foods. It is better to have a nutritious, high-calorie food, such as an avocado, than a food with few nutrients, such as a bag of potato chips. Use your calories on foods and drinks that will fill you up and will not leave you hungry soon after eating. Examples of foods that fill you up are nuts and nut butters, vegetables, lean proteins, and high-fiber foods such as whole grains. High-fiber foods are foods with more than 5 g of fiber per serving. Pay attention to calories in drinks. Low-calorie drinks include water and unsweetened drinks. The items listed above may not be a complete list of foods and beverages you can eat. Contact a dietitian for more information. What foods should I limit? Limit foods or drinks that are not good sources  of vitamins, minerals, or protein or that are high in unhealthy fats. These include: Candy. Other sweets. Sodas, specialty coffee drinks, alcohol, and juice. The items listed above may not be a complete list of foods and beverages you should avoid. Contact a dietitian for more information. How do I count calories when eating out? Pay attention to portions. Often, portions are much larger when eating out. Try these tips to keep portions smaller: Consider sharing a meal instead of getting your own. If you get your own meal, eat only half of it. Before you start eating, ask for a container and put half of your meal into it. When available, consider ordering smaller portions from the menu instead of full portions. Pay attention to your food and drink choices. Knowing the way food is cooked and what is included with the meal can help you eat fewer calories. If calories are listed on the menu, choose the lower-calorie options. Choose dishes that include vegetables, fruits, whole grains, low-fat dairy products, and lean proteins. Choose items that are boiled, broiled, grilled, or steamed. Avoid items that are buttered, battered, fried, or served with cream sauce. Items labeled as crispy are usually fried, unless stated otherwise. Choose water, low-fat milk, unsweetened iced tea, or other drinks without added sugar. If you want an alcoholic beverage, choose a lower-calorie option, such as a glass of wine or light beer. Ask for dressings, sauces, and syrups on the side. These are usually high in calories, so you should limit the amount you eat. If you want a salad, choose a garden salad and ask for grilled meats. Avoid extra toppings such as bacon, cheese, or fried items. Ask for the dressing on the side, or ask for olive oil and vinegar or lemon to use as dressing. Estimate how many servings of a food you are given. Knowing serving sizes will help you be aware of how much food you are eating at  restaurants. Where to find more information Centers for Disease Control and Prevention: FootballExhibition.com.br U.S. Department of Agriculture: WrestlingReporter.dk Summary Calorie counting means keeping track of how many calories you eat and drink each day. If you eat fewer calories  than your body needs, you should lose weight. A healthy amount of weight to lose per week is usually 1-2 lb (0.5-0.9 kg). This usually means reducing your daily calorie intake by 500-750 calories. The number of calories in a food can be found on a Nutrition Facts label. If a food does not have a Nutrition Facts label, try to look up the calories online or ask your dietitian for help. Use smaller plates, glasses, and bowls for smaller portions and to prevent overeating. Use your calories on foods and drinks that will fill you up and not leave you hungry shortly after a meal. This information is not intended to replace advice given to you by your health care provider. Make sure you discuss any questions you have with your healthcare provider. Document Revised: 09/22/2019 Document Reviewed: 09/22/2019 Elsevier Patient Education  2022 [employer].

## 2021-04-05 LAB — MONONUCLEOSIS SCREEN: Mono Screen: NEGATIVE

## 2021-04-08 ENCOUNTER — Other Ambulatory Visit: Payer: Self-pay | Admitting: Nurse Practitioner

## 2021-04-08 ENCOUNTER — Telehealth: Payer: Self-pay

## 2021-04-08 DIAGNOSIS — Z6835 Body mass index (BMI) 35.0-35.9, adult: Secondary | ICD-10-CM

## 2021-04-08 NOTE — Telephone Encounter (Signed)
Prior Auth was sent through patient's insurance for the Ozempic, and the patient was denied for it and wont cover it.

## 2021-05-30 ENCOUNTER — Ambulatory Visit: Payer: BC Managed Care – PPO | Admitting: Family Medicine

## 2021-06-21 ENCOUNTER — Telehealth (INDEPENDENT_AMBULATORY_CARE_PROVIDER_SITE_OTHER): Payer: BC Managed Care – PPO | Admitting: Legal Medicine

## 2021-06-21 ENCOUNTER — Encounter: Payer: Self-pay | Admitting: Legal Medicine

## 2021-06-21 VITALS — Ht 71.0 in | Wt 253.0 lb

## 2021-06-21 DIAGNOSIS — J301 Allergic rhinitis due to pollen: Secondary | ICD-10-CM | POA: Diagnosis not present

## 2021-06-21 MED ORDER — METHYLPREDNISOLONE 4 MG PO TBPK: ORAL_TABLET | ORAL | 0 refills | Status: DC

## 2021-06-21 NOTE — Progress Notes (Signed)
Virtual Visit via Video Note   This visit type was conducted due to national recommendations for restrictions regarding the COVID-19 Pandemic (e.g. social distancing) in an effort to limit this patient's exposure and mitigate transmission in our community.  Due to his co-morbid illnesses, this patient is at least at moderate risk for complications without adequate follow up.  This format is felt to be most appropriate for this patient at this time.  All issues noted in this document were discussed and addressed.  A limited physical exam was performed with this format.  A verbal consent was obtained for the virtual visit.   Date:  06/23/2021   ID:  Brett Roberts, DOB [birth year], MRN [id number]  Patient Location: Home Provider Location: Office/Clinic  PCP:  Blane Ohara, MD   Evaluation Performed:  New Patient Evaluation  Chief Complaint:  cough  History of Present Illness:    Brett Roberts is a 40 y.o. male with cough and congestion.  Allergies for 2 weeks.  The patient does not have symptoms concerning for COVID-19 infection (fever, chills, cough, or new shortness of breath).    Past Medical History:  Diagnosis Date   Acid reflux     Past Surgical History:  Procedure Laterality Date   NO PAST SURGERIES      Family History  Problem Relation Age of Onset   Hypertension Mother    Heart attack Father    Hypertension Sister    Hyperlipidemia Brother     Social History   Socioeconomic History   Marital status: Married    Spouse name: Not on file   Number of children: Not on file   Years of education: Not on file   Highest education level: Not on file  Occupational History   Not on file  Tobacco Use   Smoking status: Never   Smokeless tobacco: Never  Substance and Sexual Activity   Alcohol use: Yes    Alcohol/week: 1.0 - 2.0 standard drink    Types: 1 - 2 Cans of beer per week    Comment: twice weekly    Drug use: Never   Sexual activity: Yes  Other Topics  Concern   Not on file  Social History Narrative   Not on file   Social Determinants of Health   Financial Resource Strain: Not on file  Food Insecurity: Not on file  Transportation Needs: Not on file  Physical Activity: Not on file  Stress: Not on file  Social Connections: Not on file  Intimate Partner Violence: Not on file    Outpatient Medications Prior to Visit  Medication Sig Dispense Refill   fluticasone (FLONASE) 50 MCG/ACT nasal spray Place 2 sprays into both nostrils daily. 16 g 6   rosuvastatin (CRESTOR) 10 MG tablet Take 1 tablet (10 mg total) by mouth daily. (Patient not taking: Reported on 06/21/2021) 30 tablet 1   amoxicillin-clavulanate (AUGMENTIN) 875-125 MG tablet Take 1 tablet by mouth 2 (two) times daily. 20 tablet 0   esomeprazole (NEXIUM) 40 MG capsule Take 1 capsule (40 mg total) by mouth 2 (two) times daily before a meal. 60 capsule 3   fluconazole (DIFLUCAN) 150 MG tablet Take 150 mg by mouth once.     magic mouthwash SOLN Take 5 mLs by mouth 4 (four) times daily as needed for mouth pain. 120 mL 0   Semaglutide,0.25 or 0.5MG /DOS, (OZEMPIC, 0.25 OR 0.5 MG/DOSE,) 2 MG/1.5ML SOPN Inject 0.5 mg into the skin once a week. 1.5 mL  1   No facility-administered medications prior to visit.    Allergies:   Patient has no known allergies.   Social History   Tobacco Use   Smoking status: Never   Smokeless tobacco: Never  Substance Use Topics   Alcohol use: Yes    Alcohol/week: 1.0 - 2.0 standard drink    Types: 1 - 2 Cans of beer per week    Comment: twice weekly    Drug use: Never     Review of Systems  Constitutional:  Negative for chills and fever.  HENT:  Negative for congestion and nosebleeds.   Eyes:  Negative for redness.  Cardiovascular:  Negative for palpitations and claudication.  Genitourinary: Negative.   Musculoskeletal:  Negative for myalgias.  Psychiatric/Behavioral: Negative.      Labs/Other Tests and Data Reviewed:    Recent  Labs: 01/04/2021: ALT 20; BUN 18; Creatinine, Ser 1.00; Hemoglobin 14.6; Platelets 249; Potassium 4.8; Sodium 140; TSH 1.580   Recent Lipid Panel Lab Results  Component Value Date/Time   CHOL 229 (H) 01/04/2021 08:22 AM   TRIG 81 01/04/2021 08:22 AM   HDL 43 01/04/2021 08:22 AM   CHOLHDL 5.3 (H) 01/04/2021 08:22 AM   LDLCALC 172 (H) 01/04/2021 08:22 AM    Wt Readings from Last 3 Encounters:  06/21/21 253 lb (114.8 kg)  04/04/21 253 lb (114.8 kg)  03/11/21 263 lb (119.3 kg)     Objective:    Vital Signs:  Ht 5\' 11"  (1.803 m)   Wt 253 lb (114.8 kg)   BMI 35.29 kg/m    Physical Exam vs stable  ASSESSMENT & PLAN:   Diagnoses and all orders for this visit: Seasonal allergic rhinitis due to pollen -     methylPREDNISolone (MEDROL DOSEPAK) 4 MG TBPK tablet; Take 6ills first day , then 5 pills day 2 and then cut down one pill day until gone    Chronic allergic rhinitis, on zyrtec and flonase    COVID-19 Education: The signs and symptoms of COVID-19 were discussed with the patient and how to seek care for testing (follow up with PCP or arrange E-visit). The importance of social distancing was discussed today.   I spent dedicated to the care of this patient on the date of this encounter to include face-to-face time with the patient, as well as:   Follow Up:  In Person prn  Signed, , MD  06/23/2021 4:57 PM    [name]

## 2021-06-21 NOTE — Progress Notes (Signed)
Acute Office Visit  Subjective:    Patient ID: Brett Roberts, male    DOB: August 21, 1981, 40 y.o.   MRN: [id number]  Chief Complaint  Patient presents with   Sinusitis    HPI: Patient is in today for nasal congestion, pos-drainage and sinus pressure since one week. He is using flonase and allergy medicine and it did not help him.   Past Medical History:  Diagnosis Date   Acid reflux     Past Surgical History:  Procedure Laterality Date   NO PAST SURGERIES      Family History  Problem Relation Age of Onset   Hypertension Mother    Heart attack Father    Hypertension Sister    Hyperlipidemia Brother     Social History   Socioeconomic History   Marital status: Married    Spouse name: Not on file   Number of children: Not on file   Years of education: Not on file   Highest education level: Not on file  Occupational History   Not on file  Tobacco Use   Smoking status: Never   Smokeless tobacco: Never  Substance and Sexual Activity   Alcohol use: Yes    Alcohol/week: 1.0 - 2.0 standard drink    Types: 1 - 2 Cans of beer per week    Comment: twice weekly    Drug use: Never   Sexual activity: Yes  Other Topics Concern   Not on file  Social History Narrative   Not on file   Social Determinants of Health   Financial Resource Strain: Not on file  Food Insecurity: Not on file  Transportation Needs: Not on file  Physical Activity: Not on file  Stress: Not on file  Social Connections: Not on file  Intimate Partner Violence: Not on file    Outpatient Medications Prior to Visit  Medication Sig Dispense Refill   fluticasone (FLONASE) 50 MCG/ACT nasal spray Place 2 sprays into both nostrils daily. 16 g 6   rosuvastatin (CRESTOR) 10 MG tablet Take 1 tablet (10 mg total) by mouth daily. (Patient not taking: Reported on 06/21/2021) 30 tablet 1   amoxicillin-clavulanate (AUGMENTIN) 875-125 MG tablet Take 1 tablet by mouth 2 (two) times daily. 20 tablet 0    esomeprazole (NEXIUM) 40 MG capsule Take 1 capsule (40 mg total) by mouth 2 (two) times daily before a meal. 60 capsule 3   fluconazole (DIFLUCAN) 150 MG tablet Take 150 mg by mouth once.     magic mouthwash SOLN Take 5 mLs by mouth 4 (four) times daily as needed for mouth pain. 120 mL 0   Semaglutide,0.25 or 0.5MG/DOS, (OZEMPIC, 0.25 OR 0.5 MG/DOSE,) 2 MG/1.5ML SOPN Inject 0.5 mg into the skin once a week. 1.5 mL 1   No facility-administered medications prior to visit.    No Known Allergies  Review of Systems  Constitutional:  Negative for chills, fatigue and fever.  HENT:  Positive for postnasal drip, sinus pressure and sinus pain. Negative for congestion, ear pain and sore throat.   Respiratory:  Negative for cough and shortness of breath.   Cardiovascular:  Negative for chest pain.  Musculoskeletal:  Negative for myalgias.  Neurological:  Negative for headaches.      Objective:    Physical Exam  Ht '5\' 11"'  (1.803 m)   Wt 253 lb (114.8 kg)   BMI 35.29 kg/m  Wt Readings from Last 3 Encounters:  06/21/21 253 lb (114.8 kg)  04/04/21 253 lb (114.8  kg)  03/11/21 263 lb (119.3 kg)    Health Maintenance Due  Topic Date Due   COVID-19 Vaccine (1) Never done   HIV Screening  Never done   Hepatitis C Screening  Never done   TETANUS/TDAP  Never done   INFLUENZA VACCINE  Never done    There are no preventive care reminders to display for this patient.   Lab Results  Component Value Date   TSH 1.580 01/04/2021   Lab Results  Component Value Date   WBC 6.1 01/04/2021   HGB 14.6 01/04/2021   HCT 43.0 01/04/2021   MCV 87 01/04/2021   PLT 249 01/04/2021   Lab Results  Component Value Date   NA 140 01/04/2021   K 4.8 01/04/2021   CO2 23 01/04/2021   GLUCOSE 97 01/04/2021   BUN 18 01/04/2021   CREATININE 1.00 01/04/2021   BILITOT 0.3 01/04/2021   ALKPHOS 96 01/04/2021   AST 13 01/04/2021   ALT 20 01/04/2021   PROT 7.4 01/04/2021   ALBUMIN 4.5 01/04/2021   CALCIUM  9.3 01/04/2021   ANIONGAP 7 04/21/2020   EGFR 98 01/04/2021   Lab Results  Component Value Date   CHOL 229 (H) 01/04/2021   Lab Results  Component Value Date   HDL 43 01/04/2021   Lab Results  Component Value Date   LDLCALC 172 (H) 01/04/2021   Lab Results  Component Value Date   TRIG 81 01/04/2021   Lab Results  Component Value Date   CHOLHDL 5.3 (H) 01/04/2021   No results found for: HGBA1C     Assessment & Plan:   Problem List Items Addressed This Visit   None No orders of the defined types were placed in this encounter.   No orders of the defined types were placed in this encounter.    Follow-up: No follow-ups on file.  An After Visit Summary was printed and given to the patient.  Reinaldo Meeker, MD [name] [phone]

## 2021-07-22 ENCOUNTER — Telehealth: Payer: Self-pay

## 2021-07-22 ENCOUNTER — Ambulatory Visit: Payer: BC Managed Care – PPO | Admitting: Nurse Practitioner

## 2021-07-22 ENCOUNTER — Other Ambulatory Visit: Payer: Self-pay

## 2021-07-22 ENCOUNTER — Encounter: Payer: Self-pay | Admitting: Nurse Practitioner

## 2021-07-22 VITALS — BP 124/68 | HR 79 | Temp 97.3°F | Ht 71.0 in | Wt 263.0 lb

## 2021-07-22 DIAGNOSIS — R051 Acute cough: Secondary | ICD-10-CM

## 2021-07-22 DIAGNOSIS — R6889 Other general symptoms and signs: Secondary | ICD-10-CM | POA: Diagnosis not present

## 2021-07-22 DIAGNOSIS — J309 Allergic rhinitis, unspecified: Secondary | ICD-10-CM | POA: Diagnosis not present

## 2021-07-22 LAB — POC COVID19 BINAXNOW: SARS Coronavirus 2 Ag: NEGATIVE

## 2021-07-22 LAB — POCT INFLUENZA A/B
Influenza A, POC: NEGATIVE
Influenza B, POC: NEGATIVE

## 2021-07-22 MED ORDER — MONTELUKAST SODIUM 10 MG PO TABS: 10.0000 mg | ORAL_TABLET | Freq: Every day | ORAL | 3 refills | Status: DC

## 2021-07-22 MED ORDER — PROMETHAZINE-DM 6.25-15 MG/5ML PO SYRP: 5.0000 mL | ORAL_SOLUTION | Freq: Four times a day (QID) | ORAL | 0 refills | Status: DC | PRN

## 2021-07-22 MED ORDER — LEVOCETIRIZINE DIHYDROCHLORIDE 5 MG PO TABS: 5.0000 mg | ORAL_TABLET | Freq: Every evening | ORAL | 1 refills | Status: DC

## 2021-07-22 MED ORDER — OSELTAMIVIR PHOSPHATE 75 MG PO CAPS: 75.0000 mg | ORAL_CAPSULE | Freq: Two times a day (BID) | ORAL | 0 refills | Status: DC

## 2021-07-22 NOTE — Patient Instructions (Addendum)
Stop Claritin 10 mg daily Begin Singulair 10 mg at bedtime Begin Xyzal 5 mg daily for allergies Take Tamiflu twice daily for 5 days Take cough syrup up to 4 times daily Take Amoxicillin twice daily for 10 days for right ear infection Replace toothbrushes and toothpaste Rest and push fluids Follow-up as needed   Influenza, Adult Influenza, also called "the flu," is a viral infection that mainly affects the respiratory tract. This includes the lungs, nose, and throat. The flu spreads easily from person to person (is contagious). It causes common cold symptoms, along with high fever and body aches. What are the causes? This condition is caused by the influenza virus. You can get the virus by: Breathing in droplets that are in the air from an infected person's cough or sneeze. Touching something that has the virus on it (has been contaminated) and then touching your mouth, nose, or eyes. What increases the risk? The following factors may make you more likely to get the flu: Not washing or sanitizing your hands often. Having close contact with many people during cold and flu season. Touching your mouth, eyes, or nose without first washing or sanitizing your hands. Not getting an annual flu shot. You may have a higher risk for the flu, including serious problems, such as a lung infection (pneumonia), if you: Are older than 65. Are pregnant. Have a weakened disease-fighting system (immune system). This includes people who have HIV or AIDS, are on chemotherapy, or are taking medicines that reduce (suppress) the immune system. Have a long-term (chronic) illness, such as heart disease, kidney disease, diabetes, or lung disease. Have a liver disorder. Are severely overweight (morbidly obese). Have anemia. Have asthma. What are the signs or symptoms? Symptoms of this condition usually begin suddenly and last 4-14 days. These may include: Fever and chills. Headaches, body aches, or muscle  aches. Sore throat. Cough. Runny or stuffy (congested) nose. Chest discomfort. Poor appetite. Weakness or fatigue. Dizziness. Nausea or vomiting. How is this diagnosed? This condition may be diagnosed based on: Your symptoms and medical history. A physical exam. Swabbing your nose or throat and testing the fluid for the influenza virus. How is this treated? If the flu is diagnosed early, you can be treated with antiviral medicine that is given by mouth (orally) or through an IV. This can help reduce how severe the illness is and how long it lasts. Taking care of yourself at home can help relieve symptoms. Your health care provider may recommend: Taking over-the-counter medicines. Drinking plenty of fluids. In many cases, the flu goes away on its own. If you have severe symptoms or complications, you may be treated in a hospital. Follow these instructions at home: Activity Rest as needed and get plenty of sleep. Stay home from work or school as told by your health care provider. Unless you are visiting your health care provider, avoid leaving home until your fever has been gone for 24 hours without taking medicine. Eating and drinking Take an oral rehydration solution (ORS). This is a drink that is sold at pharmacies and retail stores. Drink enough fluid to keep your urine pale yellow. Drink clear fluids in small amounts as you are able. Clear fluids include water, ice chips, fruit juice mixed with water, and low-calorie sports drinks. Eat bland, easy-to-digest foods in small amounts as you are able. These foods include bananas, applesauce, rice, lean meats, toast, and crackers. Avoid drinking fluids that contain a lot of sugar or caffeine, such as energy  drinks, regular sports drinks, and soda. Avoid alcohol. Avoid spicy or fatty foods. General instructions   Take over-the-counter and prescription medicines only as told by your health care provider. Use a cool mist humidifier to  add humidity to the air in your home. This can make it easier to breathe. When using a cool mist humidifier, clean it daily. Empty the water and replace it with clean water. Cover your mouth and nose when you cough or sneeze. Wash your hands with soap and water often and for at least 20 seconds, especially after you cough or sneeze. If soap and water are not available, use alcohol-based hand sanitizer. Keep all follow-up visits. This is important. How is this prevented?  Get an annual flu shot. This is usually available in late summer, fall, or winter. Ask your health care provider when you should get your flu shot. Avoid contact with people who are sick during cold and flu season. This is generally fall and winter. Contact a health care provider if: You develop new symptoms. You have: Chest pain. Diarrhea. A fever. Your cough gets worse. You produce more mucus. You feel nauseous or you vomit. Get help right away if you: Develop shortness of breath or have difficulty breathing. Have skin or nails that turn a bluish color. Have severe pain or stiffness in your neck. Develop a sudden headache or sudden pain in your face or ear. Cannot eat or drink without vomiting. These symptoms may represent a serious problem that is an emergency. Do not wait to see if the symptoms will go away. Get medical help right away. Call your local emergency services (911 in the [place] Do not drive yourself to the hospital. Summary Influenza, also called "the flu," is a viral infection that primarily affects your respiratory tract. Symptoms of the flu usually begin suddenly and last 4-14 days. Getting an annual flu shot is the best way to prevent getting the flu. Stay home from work or school as told by your health care provider. Unless you are visiting your health care provider, avoid leaving home until your fever has been gone for 24 hours without taking medicine. Keep all follow-up visits. This is  important. This information is not intended to replace advice given to you by your health care provider. Make sure you discuss any questions you have with your health care provider. Document Revised: 03/30/2020 Document Reviewed: 03/30/2020 Elsevier Patient Education  2022 [employer].

## 2021-07-22 NOTE — Telephone Encounter (Signed)
Letter has been sent to patient instructing them to call us if they are still interested in completing their sleep study. If we have not received a response from the patient within 30 days of this notice, the order will be cancelled and they will need to discuss the need for a sleep study at their next office visit.  ° °

## 2021-07-22 NOTE — Progress Notes (Signed)
Subjective:  Patient ID: Brett Roberts, male    DOB: 10/20/1980  Age: 40 y.o. MRN: [id number]  Chief Complaint  Patient presents with   URI    HPI  Brett Roberts is a 40 year old Caucasian male that presents with fever, diarrhea, sore throat, post-nasal-drip, and fatigue. Onset of symptoms was yesterday. Denies treatment. Denies known exposure to ill-contacts. He has past medical history of chronic allergic rhinitis  Current Outpatient Medications on File Prior to Visit  Medication Sig Dispense Refill   Esomeprazole Magnesium (NEXIUM PO) Take by mouth.     loratadine (CLARITIN) 10 MG tablet Take 10 mg by mouth daily.     fluticasone (FLONASE) 50 MCG/ACT nasal spray Place 2 sprays into both nostrils daily. 16 g 6   rosuvastatin (CRESTOR) 10 MG tablet Take 1 tablet (10 mg total) by mouth daily. (Patient not taking: Reported on 06/21/2021) 30 tablet 1   No current facility-administered medications on file prior to visit.   Past Medical History:  Diagnosis Date   Acid reflux    Past Surgical History:  Procedure Laterality Date   NO PAST SURGERIES      Family History  Problem Relation Age of Onset   Hypertension Mother    Heart attack Father    Hypertension Sister    Hyperlipidemia Brother    Social History   Socioeconomic History   Marital status: Married    Spouse name: Not on file   Number of children: Not on file   Years of education: Not on file   Highest education level: Not on file  Occupational History   Not on file  Tobacco Use   Smoking status: Never   Smokeless tobacco: Never  Substance and Sexual Activity   Alcohol use: Yes    Alcohol/week: 1.0 - 2.0 standard drink    Types: 1 - 2 Cans of beer per week    Comment: twice weekly    Drug use: Never   Sexual activity: Yes  Other Topics Concern   Not on file  Social History Narrative   Not on file   Social Determinants of Health   Financial Resource Strain: Not on file  Food Insecurity: Not on file   Transportation Needs: Not on file  Physical Activity: Not on file  Stress: Not on file  Social Connections: Not on file    Review of Systems  Constitutional:  Positive for chills, fatigue and fever (100.0). Negative for diaphoresis.  HENT:  Positive for postnasal drip, rhinorrhea, sinus pressure, sinus pain, sneezing and sore throat. Negative for congestion and ear pain.   Respiratory:  Positive for cough. Negative for shortness of breath.   Cardiovascular:  Negative for chest pain and leg swelling.  Gastrointestinal:  Positive for diarrhea. Negative for abdominal pain, constipation, nausea and vomiting.  Genitourinary:  Negative for dysuria and urgency.  Musculoskeletal:  Positive for arthralgias and myalgias. Negative for back pain.       Generalized body aches  Skin: Negative.   Allergic/Immunologic: Positive for environmental allergies.  Neurological:  Positive for headaches. Negative for dizziness.  Hematological: Negative.   Psychiatric/Behavioral:  Negative for dysphoric mood. The patient is not nervous/anxious.     Objective:  Pulse 79   Temp (!) 97.3 F (36.3 C)   Ht 5\' 11"  (1.803 m)   Wt 263 lb (119.3 kg)   SpO2 99%   BMI 36.68 kg/m  BP 124/68   Pulse 79   Temp (!) 97.3 F (36.3 C)  Ht 5\' 11"  (1.803 m)   Wt 263 lb (119.3 kg)   SpO2 99%   BMI 36.68 kg/m    BP/Weight 07/22/2021 06/21/2021 04/04/2021  Systolic BP - - 132  Diastolic BP - - 68  Wt. (Lbs) 263 253 253  BMI 36.68 35.29 35.29    Physical Exam Vitals reviewed.  Constitutional:      Appearance: He is ill-appearing.  HENT:     Head: Normocephalic.     Right Ear: Tenderness present. Tympanic membrane is erythematous.     Left Ear: Tympanic membrane normal.     Nose: Congestion and rhinorrhea present.     Mouth/Throat:     Mouth: Mucous membranes are moist.     Pharynx: Posterior oropharyngeal erythema and uvula swelling present.     Tonsils: No tonsillar exudate or tonsillar abscesses. 2+ on  the right. 2+ on the left.  Eyes:     Pupils: Pupils are equal, round, and reactive to light.  Cardiovascular:     Rate and Rhythm: Normal rate and regular rhythm.     Pulses: Normal pulses.     Heart sounds: Normal heart sounds.  Pulmonary:     Effort: Pulmonary effort is normal.     Breath sounds: Normal breath sounds.  Abdominal:     General: Bowel sounds are normal.     Palpations: Abdomen is soft.  Musculoskeletal:        General: Normal range of motion.     Cervical back: Neck supple.  Skin:    General: Skin is warm and dry.     Capillary Refill: Capillary refill takes less than 2 seconds.  Neurological:     General: No focal deficit present.     Mental Status: He is alert and oriented to person, place, and time.  Psychiatric:        Mood and Affect: Mood normal.        Behavior: Behavior normal.      Lab Results  Component Value Date   WBC 6.1 01/04/2021   HGB 14.6 01/04/2021   HCT 43.0 01/04/2021   PLT 249 01/04/2021   GLUCOSE 97 01/04/2021   CHOL 229 (H) 01/04/2021   TRIG 81 01/04/2021   HDL 43 01/04/2021   LDLCALC 172 (H) 01/04/2021   ALT 20 01/04/2021   AST 13 01/04/2021   NA 140 01/04/2021   K 4.8 01/04/2021   CL 103 01/04/2021   CREATININE 1.00 01/04/2021   BUN 18 01/04/2021   CO2 23 01/04/2021   TSH 1.580 01/04/2021      Assessment & Plan:    1. Flu-like symptoms - oseltamivir (TAMIFLU) 75 MG capsule; Take 1 capsule (75 mg total) by mouth 2 (two) times daily.  Dispense: 10 capsule; Refill: 0  2. Acute cough - POCT Influenza A/B - POC COVID-19 BinaxNow - promethazine-dextromethorphan (PROMETHAZINE-DM) 6.25-15 MG/5ML syrup; Take 5 mLs by mouth 4 (four) times daily as needed.  Dispense: 118 mL; Refill: 0  3. Chronic allergic rhinitis - montelukast (SINGULAIR) 10 MG tablet; Take 1 tablet (10 mg total) by mouth at bedtime.  Dispense: 90 tablet; Refill: 3 - levocetirizine (XYZAL) 5 MG tablet; Take 1 tablet (5 mg total) by mouth every evening.   Dispense: 90 tablet; Refill: 1 - Ambulatory referral to Allergy    Stop Claritin 10 mg daily Begin Singulair 10 mg at bedtime Begin Xyzal 5 mg daily for allergies Take Tamiflu twice daily for 5 days Take cough syrup up to 4 times  daily Take Amoxicillin twice daily for 10 days for right ear infection Replace toothbrushes and toothpaste Rest and push fluids Follow-up as needed     Follow-up: PRN  An After Visit Summary was printed and given to the patient.  I, Janie Morning, NP, have reviewed all documentation for this visit. The documentation on 07/22/21 for the exam, diagnosis, procedures, and orders are all accurate and complete.   Janie Morning, NP [name] [phone]

## 2021-08-15 ENCOUNTER — Ambulatory Visit: Payer: BC Managed Care – PPO | Admitting: Family Medicine

## 2021-08-15 ENCOUNTER — Encounter: Payer: Self-pay | Admitting: Family Medicine

## 2021-08-15 VITALS — BP 128/78 | HR 88 | Temp 97.3°F | Ht 71.0 in | Wt 265.2 lb

## 2021-08-15 DIAGNOSIS — J3089 Other allergic rhinitis: Secondary | ICD-10-CM | POA: Diagnosis not present

## 2021-08-15 DIAGNOSIS — J309 Allergic rhinitis, unspecified: Secondary | ICD-10-CM | POA: Insufficient documentation

## 2021-08-15 DIAGNOSIS — M65339 Trigger finger, unspecified middle finger: Secondary | ICD-10-CM

## 2021-08-15 HISTORY — DX: Allergic rhinitis, unspecified: J30.9

## 2021-08-15 MED ORDER — AZELASTINE HCL 0.1 % NA SOLN: 1.0000 | Freq: Two times a day (BID) | NASAL | 12 refills | Status: DC

## 2021-08-15 MED ORDER — TRIAMCINOLONE ACETONIDE 40 MG/ML IJ SUSP
80.0000 mg | Freq: Once | INTRAMUSCULAR | Status: AC
Start: 2021-08-15 — End: 2021-08-15
  Administered 2021-08-15: 16:00:00 80 mg via INTRAMUSCULAR

## 2021-08-15 NOTE — Patient Instructions (Addendum)
Kenalog 80 mg shot given.  May continue  Singulair 10 mg once daily, Xyzal 5 mg once daily, Flonase 2 sprays twice day. May start astelin nasal spray if shot does not help.  All meds may be taken together if needed.

## 2021-08-15 NOTE — Progress Notes (Signed)
Acute Office Visit  Subjective:    Patient ID: Brett Roberts, male    DOB: 18-Sep-1980, 40 y.o.   MRN: [id number]  Chief Complaint  Patient presents with   Sinus Problem    HPI: Patient is in today complaining of sinus issues. Patient stated that he has been having congestion, and cough for months. Some mild sinus pain.  He has tried [place], [place], [place]. Did not help.  ON singulair 10 mg daily, XYZAL, flonase.  Negative covid 19 yesterday.   Complaining of trigger finger.   Past Medical History:  Diagnosis Date   Acid reflux     Past Surgical History:  Procedure Laterality Date   NO PAST SURGERIES      Family History  Problem Relation Age of Onset   Hypertension Mother    Heart attack Father    Hypertension Sister    Hyperlipidemia Brother     Social History   Socioeconomic History   Marital status: Married    Spouse name: Not on file   Number of children: Not on file   Years of education: Not on file   Highest education level: Not on file  Occupational History   Not on file  Tobacco Use   Smoking status: Never   Smokeless tobacco: Never  Substance and Sexual Activity   Alcohol use: Yes    Alcohol/week: 1.0 - 2.0 standard drink    Types: 1 - 2 Cans of beer per week    Comment: twice weekly    Drug use: Never   Sexual activity: Yes  Other Topics Concern   Not on file  Social History Narrative   Not on file   Social Determinants of Health   Financial Resource Strain: Not on file  Food Insecurity: Not on file  Transportation Needs: Not on file  Physical Activity: Not on file  Stress: Not on file  Social Connections: Not on file  Intimate Partner Violence: Not on file    Outpatient Medications Prior to Visit  Medication Sig Dispense Refill   Esomeprazole Magnesium (NEXIUM PO) Take by mouth.     fluticasone (FLONASE) 50 MCG/ACT nasal spray Place 2 sprays into both nostrils daily. 16 g 6   loratadine (CLARITIN) 10 MG tablet Take 10 mg by  mouth daily.     montelukast (SINGULAIR) 10 MG tablet Take 1 tablet (10 mg total) by mouth at bedtime. 90 tablet 3   levocetirizine (XYZAL) 5 MG tablet Take 1 tablet (5 mg total) by mouth every evening. 90 tablet 1   oseltamivir (TAMIFLU) 75 MG capsule Take 1 capsule (75 mg total) by mouth 2 (two) times daily. 10 capsule 0   promethazine-dextromethorphan (PROMETHAZINE-DM) 6.25-15 MG/5ML syrup Take 5 mLs by mouth 4 (four) times daily as needed. 118 mL 0   rosuvastatin (CRESTOR) 10 MG tablet Take 1 tablet (10 mg total) by mouth daily. (Patient not taking: Reported on 06/21/2021) 30 tablet 1   No facility-administered medications prior to visit.    No Known Allergies  Review of Systems  Constitutional:  Negative for fatigue.  HENT:  Positive for congestion and sinus pressure. Negative for ear pain and sore throat.   Respiratory:  Positive for cough. Negative for shortness of breath.   Cardiovascular:  Negative for chest pain.  Gastrointestinal:  Negative for abdominal pain, constipation, diarrhea, nausea and vomiting.  Genitourinary:  Negative for dysuria, frequency and urgency.  Musculoskeletal:  Negative for arthralgias, back pain and myalgias.  Neurological:  Negative  for dizziness and headaches.  Psychiatric/Behavioral:  Negative for agitation and sleep disturbance. The patient is not nervous/anxious.       Objective:    Physical Exam Vitals reviewed.  Constitutional:      Appearance: Normal appearance.  HENT:     Right Ear: Tympanic membrane, ear canal and external ear normal.     Left Ear: Tympanic membrane, ear canal and external ear normal.     Nose: Congestion (left nostril) and rhinorrhea present.     Mouth/Throat:     Mouth: Mucous membranes are moist.     Pharynx: No oropharyngeal exudate or posterior oropharyngeal erythema.  Cardiovascular:     Rate and Rhythm: Normal rate and regular rhythm.     Heart sounds: Normal heart sounds.  Pulmonary:     Effort: Pulmonary  effort is normal. No respiratory distress.     Breath sounds: Normal breath sounds. No wheezing, rhonchi or rales.  Musculoskeletal:     Cervical back: Normal range of motion.     Comments: 3rd digit trigger finger.   Lymphadenopathy:     Cervical: No cervical adenopathy.  Neurological:     Mental Status: He is alert.  Psychiatric:        Mood and Affect: Mood normal.        Behavior: Behavior normal.    BP 128/78 (BP Location: Right Arm, Patient Position: Sitting, Cuff Size: Large)    Pulse 88    Temp (!) 97.3 F (36.3 C) (Temporal)    Ht '5\' 11"'  (1.803 m)    Wt 265 lb 3.2 oz (120.3 kg)    SpO2 94%    BMI 36.99 kg/m  Wt Readings from Last 3 Encounters:  08/15/21 265 lb 3.2 oz (120.3 kg)  07/22/21 263 lb (119.3 kg)  06/21/21 253 lb (114.8 kg)    Health Maintenance Due  Topic Date Due   COVID-19 Vaccine (1) Never done   TETANUS/TDAP  Never done    There are no preventive care reminders to display for this patient.   Lab Results  Component Value Date   TSH 1.580 01/04/2021   Lab Results  Component Value Date   WBC 6.1 01/04/2021   HGB 14.6 01/04/2021   HCT 43.0 01/04/2021   MCV 87 01/04/2021   PLT 249 01/04/2021   Lab Results  Component Value Date   NA 140 01/04/2021   K 4.8 01/04/2021   CO2 23 01/04/2021   GLUCOSE 97 01/04/2021   BUN 18 01/04/2021   CREATININE 1.00 01/04/2021   BILITOT 0.3 01/04/2021   ALKPHOS 96 01/04/2021   AST 13 01/04/2021   ALT 20 01/04/2021   PROT 7.4 01/04/2021   ALBUMIN 4.5 01/04/2021   CALCIUM 9.3 01/04/2021   ANIONGAP 7 04/21/2020   EGFR 98 01/04/2021   Lab Results  Component Value Date   CHOL 229 (H) 01/04/2021   Lab Results  Component Value Date   HDL 43 01/04/2021   Lab Results  Component Value Date   LDLCALC 172 (H) 01/04/2021   Lab Results  Component Value Date   TRIG 81 01/04/2021   Lab Results  Component Value Date   CHOLHDL 5.3 (H) 01/04/2021   No results found for: HGBA1C     Assessment & Plan:    Problem List Items Addressed This Visit       Respiratory   Allergic rhinitis due to allergen - Primary    Kenalog 80 mg shot given.  May continue  Singulair  10 mg once daily, Xyzal 5 mg once daily, Flonase 2 sprays twice day. May start astelin nasal spray if shot does not help.  All meds may be taken together if needed.       Relevant Medications   azelastine (ASTELIN) 0.1 % nasal spray     Musculoskeletal and Integument   Trigger middle finger    Return for trigger finger tomorrow.       Meds ordered this encounter  Medications   triamcinolone acetonide (KENALOG-40) injection 80 mg   azelastine (ASTELIN) 0.1 % nasal spray    Sig: Place 1 spray into both nostrils 2 (two) times daily. Use in each nostril as directed    Dispense:  30 mL    Refill:  12    No orders of the defined types were placed in this encounter.    Follow-up: Return in about 1 week (around 08/22/2021) for trigger finger next week. Dr. Henrene Pastor or myself. .  An After Visit Summary was printed and given to the patient.  Rochel Brome, MD [name] [phone]

## 2021-08-15 NOTE — Assessment & Plan Note (Signed)
Kenalog 80 mg shot given.  May continue  Singulair 10 mg once daily, Xyzal 5 mg once daily, Flonase 2 sprays twice day. May start astelin nasal spray if shot does not help.  All meds may be taken together if needed.

## 2021-08-15 NOTE — Assessment & Plan Note (Signed)
Return for trigger finger tomorrow.

## 2021-08-20 ENCOUNTER — Ambulatory Visit: Payer: BC Managed Care – PPO | Admitting: Family Medicine

## 2021-08-20 ENCOUNTER — Other Ambulatory Visit: Payer: Self-pay

## 2021-08-20 VITALS — BP 130/100 | HR 84 | Temp 96.7°F | Resp 16 | Ht 71.0 in | Wt 266.0 lb

## 2021-08-20 DIAGNOSIS — M65339 Trigger finger, unspecified middle finger: Secondary | ICD-10-CM

## 2021-08-20 NOTE — Progress Notes (Signed)
Acute Office Visit  Subjective:    Patient ID: Brett Roberts, male    DOB: [birth year], 40 y.o.   MRN: [id number]  Chief Complaint  Patient presents with   Right middle finger    Trigger finger.   trigger finger    HPI: Patient is in today for right middle finger triggering.  Requesting injection.  Past Medical History:  Diagnosis Date   Acid reflux     Past Surgical History:  Procedure Laterality Date   NO PAST SURGERIES      Family History  Problem Relation Age of Onset   Hypertension Mother    Heart attack Father    Hypertension Sister    Hyperlipidemia Brother     Social History   Socioeconomic History   Marital status: Married    Spouse name: Not on file   Number of children: Not on file   Years of education: Not on file   Highest education level: Not on file  Occupational History   Not on file  Tobacco Use   Smoking status: Never   Smokeless tobacco: Never  Substance and Sexual Activity   Alcohol use: Yes    Alcohol/week: 1.0 - 2.0 standard drink    Types: 1 - 2 Cans of beer per week    Comment: twice weekly    Drug use: Never   Sexual activity: Yes  Other Topics Concern   Not on file  Social History Narrative   Not on file   Social Determinants of Health   Financial Resource Strain: Not on file  Food Insecurity: Not on file  Transportation Needs: Not on file  Physical Activity: Not on file  Stress: Not on file  Social Connections: Not on file  Intimate Partner Violence: Not on file    Outpatient Medications Prior to Visit  Medication Sig Dispense Refill   levocetirizine (XYZAL) 5 MG tablet Take 5 mg by mouth every evening.     azelastine (ASTELIN) 0.1 % nasal spray Place 1 spray into both nostrils 2 (two) times daily. Use in each nostril as directed 30 mL 12   Esomeprazole Magnesium (NEXIUM PO) Take by mouth.     fluticasone (FLONASE) 50 MCG/ACT nasal spray Place 2 sprays into both nostrils daily. (Patient not taking: Reported on  08/20/2021) 16 g 6   loratadine (CLARITIN) 10 MG tablet Take 10 mg by mouth daily. (Patient not taking: Reported on 08/20/2021)     montelukast (SINGULAIR) 10 MG tablet Take 1 tablet (10 mg total) by mouth at bedtime. 90 tablet 3   No facility-administered medications prior to visit.    No Known Allergies  Review of Systems  Constitutional:  Negative for fever.  HENT:  Negative for congestion.   Respiratory:  Negative for cough and shortness of breath.   Musculoskeletal:        Trigger finger right middle finger       Objective:    Physical Exam Musculoskeletal:     Comments: Right 3rd finger triggering.    BP (!) 130/100    Pulse 84    Temp (!) 96.7 F (35.9 C)    Resp 16    Ht '5\' 11"'  (1.803 m)    Wt 266 lb (120.7 kg)    BMI 37.10 kg/m  Wt Readings from Last 3 Encounters:  08/20/21 266 lb (120.7 kg)  08/15/21 265 lb 3.2 oz (120.3 kg)  07/22/21 263 lb (119.3 kg)    Health Maintenance Due  Topic  Date Due   COVID-19 Vaccine (1) Never done   TETANUS/TDAP  Never done    There are no preventive care reminders to display for this patient.   Lab Results  Component Value Date   TSH 1.580 01/04/2021   Lab Results  Component Value Date   WBC 6.1 01/04/2021   HGB 14.6 01/04/2021   HCT 43.0 01/04/2021   MCV 87 01/04/2021   PLT 249 01/04/2021   Lab Results  Component Value Date   NA 140 01/04/2021   K 4.8 01/04/2021   CO2 23 01/04/2021   GLUCOSE 97 01/04/2021   BUN 18 01/04/2021   CREATININE 1.00 01/04/2021   BILITOT 0.3 01/04/2021   ALKPHOS 96 01/04/2021   AST 13 01/04/2021   ALT 20 01/04/2021   PROT 7.4 01/04/2021   ALBUMIN 4.5 01/04/2021   CALCIUM 9.3 01/04/2021   ANIONGAP 7 04/21/2020   EGFR 98 01/04/2021   Lab Results  Component Value Date   CHOL 229 (H) 01/04/2021   Lab Results  Component Value Date   HDL 43 01/04/2021   Lab Results  Component Value Date   LDLCALC 172 (H) 01/04/2021   Lab Results  Component Value Date   TRIG 81 01/04/2021    Lab Results  Component Value Date   CHOLHDL 5.3 (H) 01/04/2021   No results found for: HGBA1C     Assessment & Plan:   Problem List Items Addressed This Visit       Musculoskeletal and Integument   Trigger middle finger - Primary    Risks were discussed including bleeding, infection, increase in sugars if diabetic, atrophy at site of injection, and increased pain.  After consent was obtained, using sterile technique the area was prepped with alcohol.  The joint was entered and kenalog 20 mg and 0.5 ml plain Lidocaine was then injected and the needle withdrawn.  The procedure was well tolerated.   The patient is asked to continue to rest the joint for a few more days before resuming regular activities.  It may be more painful for the first 1-2 days.  Watch for fever, or increased swelling or persistent pain in the joint. Call or return to clinic prn if such symptoms occur or there is failure to improve as anticipated.      Relevant Medications   triamcinolone acetonide (KENALOG-40) injection 20 mg (Start on 08/24/2021 11:15 PM)     Follow-up: No follow-ups on file.  An After Visit Summary was printed and given to the patient.  Rochel Brome, MD [facility] [phone]

## 2021-08-21 ENCOUNTER — Telehealth: Payer: Self-pay

## 2021-08-21 ENCOUNTER — Ambulatory Visit: Payer: BC Managed Care – PPO | Admitting: Legal Medicine

## 2021-08-21 NOTE — Telephone Encounter (Signed)
Sleep study order deleted. Letter was previously mailed to pt 30 days ago. Pt can call back to scheduled sleep study.

## 2021-08-24 ENCOUNTER — Encounter: Payer: Self-pay | Admitting: Family Medicine

## 2021-08-24 MED ORDER — TRIAMCINOLONE ACETONIDE 40 MG/ML IJ SUSP: 20.0000 mg | Freq: Once | INTRAMUSCULAR | Status: DC

## 2021-08-24 NOTE — Assessment & Plan Note (Signed)
Risks were discussed including bleeding, infection, increase in sugars if diabetic, atrophy at site of injection, and increased pain.  After consent was obtained, using sterile technique the area was prepped with alcohol.  The joint was entered and kenalog 20 mg and 0.5 ml plain Lidocaine was then injected and the needle withdrawn.  The procedure was well tolerated.   The patient is asked to continue to rest the joint for a few more days before resuming regular activities.  It may be more painful for the first 1-2 days.  Watch for fever, or increased swelling or persistent pain in the joint. Call or return to clinic prn if such symptoms occur or there is failure to improve as anticipated. 

## 2021-09-17 ENCOUNTER — Encounter: Payer: Self-pay | Admitting: Allergy

## 2021-09-17 ENCOUNTER — Ambulatory Visit: Payer: BC Managed Care – PPO | Admitting: Allergy

## 2021-09-17 ENCOUNTER — Other Ambulatory Visit: Payer: Self-pay

## 2021-09-17 VITALS — BP 160/92 | HR 98 | Resp 16 | Ht 71.0 in | Wt 269.8 lb

## 2021-09-17 DIAGNOSIS — H1013 Acute atopic conjunctivitis, bilateral: Secondary | ICD-10-CM

## 2021-09-17 DIAGNOSIS — J3089 Other allergic rhinitis: Secondary | ICD-10-CM | POA: Diagnosis not present

## 2021-09-17 MED ORDER — CARBINOXAMINE MALEATE 6 MG PO TABS: ORAL_TABLET | ORAL | 5 refills | Status: DC

## 2021-09-17 MED ORDER — IPRATROPIUM BROMIDE 0.06 % NA SOLN: NASAL | 5 refills | Status: DC

## 2021-09-17 NOTE — Progress Notes (Signed)
New Patient Note  RE: Brett Peakravis W Damian MRN: [id number] DOB: May 29, 1981 Date of Office Visit: 09/17/2021  Referring provider: Janie MorningHeaton, Shannon J, NP Primary care provider: Blane Oharaox, [name], MD  Chief Complaint: Allergies  History of present illness: Brett Roberts is a 41 y.o. male presenting today for consultation for allergic rhinitis.  He states he has a constant drainage of a clear mucus and this makes him cough and throat clear.  He states it is worse at night.  He also reports sneezing and the occasional itchy/watery eyes.  Symptoms are worse during the spring and definitely fall.  He states he has had sinus infections 3-4 times a year on average treated with antibiotics.   He has been on xyzal and singulair for past 2 months that were started at same time.   He also has tried benadryl, claritin, zyrtec, allegra in the past.  He states he was alternating between his antihistamines for a while.  He feels like all the antihistamines work about the same. Also has used azelastine nasal spray as needed when the drainage is worse. He has used azelastine 1 spray each nostril 2 times a day as needed.  However does not find it to be effective.  He also use flonase in the past but states didn't help.  He also report he had a prednisone taper pack that didn't help.  He also reports getting kenalog injection about 2 months ago and states helped the most.   He does take Nexium for reflux control. He has no history of asthma, eczema or food allergy.  Review of systems: Review of Systems  Constitutional: Negative.   HENT:  Positive for postnasal drip.   Eyes: Negative.   Respiratory:  Positive for cough. Negative for chest tightness, shortness of breath and wheezing.   Cardiovascular: Negative.   Musculoskeletal: Negative.   Skin: Negative.   Allergic/Immunologic: Negative.   Neurological: Negative.    All other systems negative unless noted above in HPI  Past medical history: Past Medical  History:  Diagnosis Date   Acid reflux     Past surgical history: Past Surgical History:  Procedure Laterality Date   NO PAST SURGERIES      Family history:  Family History  Problem Relation Age of Onset   Hypertension Mother    Heart attack Father    Hypertension Sister    Hyperlipidemia Brother    Atopy Other    Eczema Other    Urticaria Other    Immunodeficiency Other    Asthma Other    Angioedema Other    Allergic rhinitis Other     Social history: Lives in a home with carpeting in the bedroom with heat pump heating and cooling.  1 dog in the home.  There is no concern for water damage, mildew or roaches in the home.  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  He denies a smoking history.   Medication List: Current Outpatient Medications  Medication Sig Dispense Refill   azelastine (ASTELIN) 0.1 % nasal spray Place 1 spray into both nostrils 2 (two) times daily. Use in each nostril as directed 30 mL 12   Carbinoxamine Maleate (RYVENT) 6 MG TABS 6mg  tablet up to 3-4 times daily 120 tablet 5   Esomeprazole Magnesium (NEXIUM PO) Take by mouth.     ipratropium (ATROVENT) 0.06 % nasal spray 1-2 sprays per nostril twice daily and up to 4 times a day 15 mL 5   levocetirizine (XYZAL) 5  MG tablet Take 5 mg by mouth every evening.     montelukast (SINGULAIR) 10 MG tablet Take 1 tablet (10 mg total) by mouth at bedtime. 90 tablet 3   Current Facility-Administered Medications  Medication Dose Route Frequency Provider Last Rate Last Admin   triamcinolone acetonide (KENALOG-40) injection 20 mg  20 mg Intra-articular Once [name], [name], MD        Known medication allergies: No Known Allergies   Physical examination: Blood pressure (!) 160/92, pulse 98, resp. rate 16, height 5\' 11"  (1.803 m), weight 269 lb 12.8 oz (122.4 kg), SpO2 99 %.  General: Alert, interactive, in no acute distress. HEENT: PERRLA, TMs pearly gray, turbinates non-edematous with clear discharge, post-pharynx  non erythematous. Neck: Supple without lymphadenopathy. Lungs: Clear to auscultation without wheezing, rhonchi or rales. {no increased work of breathing. CV: Normal S1, S2 without murmurs. Abdomen: Nondistended, nontender. Skin: Warm and dry, without lesions or rashes. Extremities:  No clubbing, cyanosis or edema. Neuro:   Grossly intact.  Diagnositics/Labs:  Allergy testing:   Airborne Adult Perc - 09/17/21 1506     Time Antigen Placed 1506    Allergen Manufacturer 09/19/21    Location Back    Number of Test 59    Panel 1 Select    1. Control-Buffer 50% Glycerol Negative    2. Control-Histamine 1 mg/ml 2+    3. Albumin saline Negative    4. Bahia Negative    5. Waynette Buttery Negative    6. Johnson Negative    7. Kentucky Blue 2+    8. Meadow Fescue Negative    9. Perennial Rye 2+    10. Sweet Vernal Negative    11. Timothy 2+    12. Cocklebur 2+    13. Burweed Marshelder Negative    14. Ragweed, short 3+    15. Ragweed, Giant Negative    16. Plantain,  English Negative    17. Lamb's Quarters Negative    18. Sheep Sorrell Negative    19. Rough Pigweed Negative    20. Marsh Elder, Rough Negative    21. Mugwort, Common Negative    22. Ash mix Negative    23. Birch mix Negative    24. Beech American Negative    25. Box, Elder Negative    26. Cedar, red Negative    27. Cottonwood, [place] Negative    28. Elm mix Negative    29. Hickory 3+    30. Maple mix Negative    31. Oak, [place] mix 2+    32. Pecan Pollen Negative    33. Pine mix Negative    34. Sycamore Eastern Negative    35. Walnut, Black Pollen Negative    36. Alternaria alternata Negative    37. Cladosporium Herbarum Negative    38. Aspergillus mix Negative    39. Penicillium mix Negative    40. Bipolaris sorokiniana (Helminthosporium) Negative    41. Drechslera spicifera (Curvularia) Negative    42. Mucor plumbeus Negative    43. Fusarium moniliforme Negative    44. Aureobasidium pullulans (pullulara)  Negative    45. Rhizopus oryzae Negative    46. Botrytis cinera Negative    47. Epicoccum nigrum Negative    48. Phoma betae Negative    49. Candida Albicans Negative    50. Trichophyton mentagrophytes Negative    51. Mite, D Farinae  5,000 AU/ml Negative    52. Mite, D Pteronyssinus  5,000 AU/ml 3+    53. Cat Hair 10,000  BAU/ml Negative    54.  Dog Epithelia Negative    55. Mixed Feathers Negative    56. Horse Epithelia Negative    57. Cockroach, German Negative    58. Mouse Negative    59. Tobacco Leaf Negative             Intradermal - 09/17/21 1506     Time Antigen Placed 1506    Allergen Manufacturer Waynette Buttery    Location Arm    Number of Test 10    Intradermal Select    Control Negative    [place] Negative    Johnson Negative    Mold 1 Negative    Mold 2 Negative    Mold 3 2+    Mold 4 Negative    Cat Negative    Dog Negative    Cockroach Negative    Mite mix Omitted             Allergy testing results were read and interpreted by provider, documented by clinical staff.   Assessment and plan: Allergic rhinitis with conjunctivitis  - Testing today showed: Positive to grass pollen, tree pollen, weed pollen, dust mites and outdoor mold - Copy of test results provided.  - Avoidance measures provided. - Stop taking:  Xyzal and Singulair as these are not effective - Can continue with:  Astelin (azelastine) 2 sprays per nostril 2 times daily as needed if you are still having some drainage with the use of nasal Atrovent as below - Start taking:  Ryvent (carbinoxamine) 6mg  tablet 3-4 times daily.  This is an antihistamine and a different category than the Claritin, Zyrtec, Allegra and Xyzal you have used in the past. Atrovent (ipratropium) 0.06% 1-2 spray per nostril twice a day and up to 4 times a day if needed for drainage (Can be over drying.  Decrease or discontinue use if nose becomes too dry) Pataday or Pazeo over-the-counter eyedrop 1 drop each eye daily  as needed for itchy watery eyes - Consider nasal saline rinses 1-2 times daily to remove allergens from the nasal cavities as well as help with mucous clearance (this is especially helpful to do before the nasal sprays are given) - Consider allergy shots as a means of long-term control if medication management is not effective - Allergy shots "re-train" and "reset" the immune system to ignore environmental allergens and decrease the resulting immune response to those allergens (sneezing, itchy watery eyes, runny nose, nasal congestion, etc).    - Allergy shots improve symptoms in 75-85% of patients.   Follow-up in 4 to 6 months or sooner if needed     I appreciate the opportunity to take part in Brett Roberts's care. Please do not hesitate to contact me with questions.  Sincerely,   11-12-1995, MD Allergy/Immunology [facility]

## 2021-09-17 NOTE — Patient Instructions (Addendum)
-   Testing today showed: Positive to grass pollen, tree pollen, weed pollen, dust mites and outdoor mold - Copy of test results provided.  - Avoidance measures provided. - Stop taking:  Xyzal and Singulair as these are not effective - Can continue with:  Astelin (azelastine) 2 sprays per nostril 2 times daily as needed if you are still having some drainage with the use of nasal Atrovent as below - Start taking:  Ryvent (carbinoxamine) 6mg  tablet 3-4 times daily.  This is an antihistamine and a different category than the Claritin, Zyrtec, Allegra and Xyzal you have used in the past. Atrovent (ipratropium) 0.06% 1-2 spray per nostril twice a day and up to 4 times a day if needed for drainage (Can be over drying.  Decrease or discontinue use if nose becomes too dry) Pataday or Pazeo over-the-counter eyedrop 1 drop each eye daily as needed for itchy watery eyes - Consider nasal saline rinses 1-2 times daily to remove allergens from the nasal cavities as well as help with mucous clearance (this is especially helpful to do before the nasal sprays are given) - Consider allergy shots as a means of long-term control if medication management is not effective - Allergy shots "re-train" and "reset" the immune system to ignore environmental allergens and decrease the resulting immune response to those allergens (sneezing, itchy watery eyes, runny nose, nasal congestion, etc).    - Allergy shots improve symptoms in 75-85% of patients.   Follow-up in 4 to 6 months or sooner if needed

## 2021-09-30 ENCOUNTER — Ambulatory Visit: Payer: BC Managed Care – PPO | Admitting: Nurse Practitioner

## 2021-09-30 ENCOUNTER — Encounter: Payer: Self-pay | Admitting: Nurse Practitioner

## 2021-09-30 ENCOUNTER — Other Ambulatory Visit: Payer: Self-pay

## 2021-09-30 VITALS — BP 142/82 | HR 85 | Temp 95.8°F | Ht 71.0 in | Wt 269.0 lb

## 2021-09-30 DIAGNOSIS — Z6837 Body mass index (BMI) 37.0-37.9, adult: Secondary | ICD-10-CM

## 2021-09-30 DIAGNOSIS — I10 Essential (primary) hypertension: Secondary | ICD-10-CM | POA: Diagnosis not present

## 2021-09-30 DIAGNOSIS — R030 Elevated blood-pressure reading, without diagnosis of hypertension: Secondary | ICD-10-CM | POA: Diagnosis not present

## 2021-09-30 DIAGNOSIS — K219 Gastro-esophageal reflux disease without esophagitis: Secondary | ICD-10-CM

## 2021-09-30 DIAGNOSIS — Z23 Encounter for immunization: Secondary | ICD-10-CM | POA: Diagnosis not present

## 2021-09-30 DIAGNOSIS — J309 Allergic rhinitis, unspecified: Secondary | ICD-10-CM

## 2021-09-30 DIAGNOSIS — E782 Mixed hyperlipidemia: Secondary | ICD-10-CM | POA: Diagnosis not present

## 2021-09-30 MED ORDER — CHLORTHALIDONE 25 MG PO TABS: 25.0000 mg | ORAL_TABLET | Freq: Every day | ORAL | 0 refills | Status: DC

## 2021-09-30 NOTE — Patient Instructions (Addendum)
Begin Chlorthalidone 25 mg for hypertension We will call you with lab results Monitor BP, keep log Low salt diet Increase physical activity Follow-up in 2 weeks Tdap updated today in office   Tdap (Tetanus, Diphtheria, Pertussis) Vaccine: What You Need to Know 1. Why get vaccinated? Tdap vaccine can prevent tetanus, diphtheria, and pertussis. Diphtheria and pertussis spread from person to person. Tetanus enters the body through cuts or wounds. TETANUS (T) causes painful stiffening of the muscles. Tetanus can lead to serious health problems, including being unable to open the mouth, having trouble swallowing and breathing, or death. DIPHTHERIA (D) can lead to difficulty breathing, heart failure, paralysis, or death. PERTUSSIS (aP), also known as "whooping cough," can cause uncontrollable, violent coughing that makes it hard to breathe, eat, or drink. Pertussis can be extremely serious especially in babies and young children, causing pneumonia, convulsions, brain damage, or death. In teens and adults, it can cause weight loss, loss of bladder control, passing out, and rib fractures from severe coughing. 2. Tdap vaccine Tdap is only for children 7 years and older, adolescents, and adults.  Adolescents should receive a single dose of Tdap, preferably at age 15 or 100 years. Pregnant people should get a dose of Tdap during every pregnancy, preferably during the early part of the third trimester, to help protect the newborn from pertussis. Infants are most at risk for severe, life-threatening complications from pertussis. Adults who have never received Tdap should get a dose of Tdap. Also, adults should receive a booster dose of either Tdap or Td (a different vaccine that protects against tetanus and diphtheria but not pertussis) every 10 years, or after 5 years in the case of a severe or dirty wound or burn. Tdap may be given at the same time as other vaccines. 3. Talk with your health care  provider Tell your vaccine provider if the person getting the vaccine: Has had an allergic reaction after a previous dose of any vaccine that protects against tetanus, diphtheria, or pertussis, or has any severe, life-threatening allergies Has had a coma, decreased level of consciousness, or prolonged seizures within 7 days after a previous dose of any pertussis vaccine (DTP, DTaP, or Tdap) Has seizures or another nervous system problem Has ever had Guillain-Barr Syndrome (also called "GBS") Has had severe pain or swelling after a previous dose of any vaccine that protects against tetanus or diphtheria In some cases, your health care provider may decide to postpone Tdap vaccination until a future visit. People with minor illnesses, such as a cold, may be vaccinated. People who are moderately or severely ill should usually wait until they recover before getting Tdap vaccine.  Your health care provider can give you more information. 4. Risks of a vaccine reaction Pain, redness, or swelling where the shot was given, mild fever, headache, feeling tired, and nausea, vomiting, diarrhea, or stomachache sometimes happen after Tdap vaccination. People sometimes faint after medical procedures, including vaccination. Tell your provider if you feel dizzy or have vision changes or ringing in the ears.  As with any medicine, there is a very remote chance of a vaccine causing a severe allergic reaction, other serious injury, or death. 5. What if there is a serious problem? An allergic reaction could occur after the vaccinated person leaves the clinic. If you see signs of a severe allergic reaction (hives, swelling of the face and throat, difficulty breathing, a fast heartbeat, dizziness, or weakness), call 9-1-1 and get the person to the nearest hospital. For other  signs that concern you, call your health care provider.  Adverse reactions should be reported to the Vaccine Adverse Event Reporting System (VAERS).  Your health care provider will usually file this report, or you can do it yourself. Visit the VAERS website at www.vaers.SamedayNews.es or call 910-330-3326. VAERS is only for reporting reactions, and VAERS staff members do not give medical advice. 6. The National Vaccine Injury Compensation Program The [employer] Vaccine Injury Compensation Program (VICP) is a federal program that was created to compensate people who may have been injured by certain vaccines. Claims regarding alleged injury or death due to vaccination have a time limit for filing, which may be as short as two years. Visit the VICP website at GoldCloset.com.ee or call 514 213 3091 to learn about the program and about filing a claim. 7. How can I learn more? Ask your health care provider. Call your local or state health department. Visit the website of the Food and Drug Administration (FDA) for vaccine package inserts and additional information at TraderRating.uy. Contact the Centers for Disease Control and Prevention (CDC): Call (505)230-9971 (1-800-CDC-INFO) or Visit CDC's website at http://hunter.com/. Vaccine Information Statement Tdap (Tetanus, Diphtheria, Pertussis) Vaccine (03/30/2020) This information is not intended to replace advice given to you by your health care provider. Make sure you discuss any questions you have with your health care provider. Document Revised: 04/25/2020 Document Reviewed: 04/25/2020 Elsevier Patient Education  2022 [place].   Hypertension, Adult Hypertension is another name for high blood pressure. High blood pressure forces your heart to work harder to pump blood. This can cause problems over time. There are two numbers in a blood pressure reading. There is a top number (systolic) over a bottom number (diastolic). It is best to have a blood pressure that is below 120/80. Healthy choices can help lower your blood pressure, or you may need medicine  to help lower it. What are the causes? The cause of this condition is not known. Some conditions may be related to high blood pressure. What increases the risk? Smoking. Having type 2 diabetes mellitus, high cholesterol, or both. Not getting enough exercise or physical activity. Being overweight. Having too much fat, sugar, calories, or salt (sodium) in your diet. Drinking too much alcohol. Having long-term (chronic) kidney disease. Having a family history of high blood pressure. Age. Risk increases with age. Race. You may be at higher risk if you are African American. Gender. Men are at higher risk than women before age 73. After age 21, women are at higher risk than men. Having obstructive sleep apnea. Stress. What are the signs or symptoms? High blood pressure may not cause symptoms. Very high blood pressure (hypertensive crisis) may cause: Headache. Feelings of worry or nervousness (anxiety). Shortness of breath. Nosebleed. A feeling of being sick to your stomach (nausea). Throwing up (vomiting). Changes in how you see. Very bad chest pain. Seizures. How is this treated? This condition is treated by making healthy lifestyle changes, such as: Eating healthy foods. Exercising more. Drinking less alcohol. Your health care provider may prescribe medicine if lifestyle changes are not enough to get your blood pressure under control, and if: Your top number is above 130. Your bottom number is above 80. Your personal target blood pressure may vary. Follow these instructions at home: Eating and drinking  If told, follow the DASH eating plan. To follow this plan: Fill one half of your plate at each meal with fruits and vegetables. Fill one fourth of your plate at each meal with  whole grains. Whole grains include whole-wheat pasta, brown rice, and whole-grain bread. Eat or drink low-fat dairy products, such as skim milk or low-fat yogurt. Fill one fourth of your plate at each  meal with low-fat (lean) proteins. Low-fat proteins include fish, chicken without skin, eggs, beans, and tofu. Avoid fatty meat, cured and processed meat, or chicken with skin. Avoid pre-made or processed food. Eat less than 1,500 mg of salt each day. Do not drink alcohol if: Your doctor tells you not to drink. You are pregnant, may be pregnant, or are planning to become pregnant. If you drink alcohol: Limit how much you use to: 0-1 drink a day for women. 0-2 drinks a day for men. Be aware of how much alcohol is in your drink. In the [place] one drink equals one 12 oz bottle of beer (355 mL), one 5 oz glass of wine (148 mL), or one 1 oz glass of hard liquor (44 mL). Lifestyle  Work with your doctor to stay at a healthy weight or to lose weight. Ask your doctor what the best weight is for you. Get at least 30 minutes of exercise most days of the week. This may include walking, swimming, or biking. Get at least 30 minutes of exercise that strengthens your muscles (resistance exercise) at least 3 days a week. This may include lifting weights or doing Pilates. Do not use any products that contain nicotine or tobacco, such as cigarettes, e-cigarettes, and chewing tobacco. If you need help quitting, ask your doctor. Check your blood pressure at home as told by your doctor. Keep all follow-up visits as told by your doctor. This is important. Medicines Take over-the-counter and prescription medicines only as told by your doctor. Follow directions carefully. Do not skip doses of blood pressure medicine. The medicine does not work as well if you skip doses. Skipping doses also puts you at risk for problems. Ask your doctor about side effects or reactions to medicines that you should watch for. Contact a doctor if you: Think you are having a reaction to the medicine you are taking. Have headaches that keep coming back (recurring). Feel dizzy. Have swelling in your ankles. Have trouble with your  vision. Get help right away if you: Get a very bad headache. Start to feel mixed up (confused). Feel weak or numb. Feel faint. Have very bad pain in your: Chest. Belly (abdomen). Throw up more than once. Have trouble breathing. Summary Hypertension is another name for high blood pressure. High blood pressure forces your heart to work harder to pump blood. For most people, a normal blood pressure is less than 120/80. Making healthy choices can help lower blood pressure. If your blood pressure does not get lower with healthy choices, you may need to take medicine. This information is not intended to replace advice given to you by your health care provider. Make sure you discuss any questions you have with your health care provider. Document Revised: 04/21/2018 Document Reviewed: 04/21/2018 Elsevier Patient Education  2022 Elsevier Inc.    Managing Your Hypertension Hypertension, also called high blood pressure, is when the force of the blood pressing against the walls of the arteries is too strong. Arteries are blood vessels that carry blood from your heart throughout your body. Hypertension forces the heart to work harder to pump blood and may cause the arteries to become narrow or stiff. Understanding blood pressure readings Your personal target blood pressure may vary depending on your medical conditions, your age, and other factors.  A blood pressure reading includes a higher number over a lower number. Ideally, your blood pressure should be below 120/80. You should know that: The first, or top, number is called the systolic pressure. It is a measure of the pressure in your arteries as your heart beats. The second, or bottom number, is called the diastolic pressure. It is a measure of the pressure in your arteries as the heart relaxes. Blood pressure is classified into four stages. Based on your blood pressure reading, your health care provider may use the following stages to determine  what type of treatment you need, if any. Systolic pressure and diastolic pressure are measured in a unit called mmHg. Normal Systolic pressure: below 123456. Diastolic pressure: below 80. Elevated Systolic pressure: [id number]. Diastolic pressure: below 80. Hypertension stage 1 Systolic pressure: 0000000. Diastolic pressure: [id number]. Hypertension stage 2 Systolic pressure: [id number] or above. Diastolic pressure: 90 or above. How can this condition affect me? Managing your hypertension is an important responsibility. Over time, hypertension can damage the arteries and decrease blood flow to important parts of the body, including the brain, heart, and kidneys. Having untreated or uncontrolled hypertension can lead to: A heart attack. A stroke. A weakened blood vessel (aneurysm). Heart failure. Kidney damage. Eye damage. Metabolic syndrome. Memory and concentration problems. Vascular dementia. What actions can I take to manage this condition? Hypertension can be managed by making lifestyle changes and possibly by taking medicines. Your health care provider will help you make a plan to bring your blood pressure within a normal range. Nutrition  Eat a diet that is high in fiber and potassium, and low in salt (sodium), added sugar, and fat. An example eating plan is called the Dietary Approaches to Stop Hypertension (DASH) diet. To eat this way: Eat plenty of fresh fruits and vegetables. Try to fill one-half of your plate at each meal with fruits and vegetables. Eat whole grains, such as whole-wheat pasta, brown rice, or whole-grain bread. Fill about one-fourth of your plate with whole grains. Eat low-fat dairy products. Avoid fatty cuts of meat, processed or cured meats, and poultry with skin. Fill about one-fourth of your plate with lean proteins such as fish, chicken without skin, beans, eggs, and tofu. Avoid pre-made and processed foods. These tend to be higher in sodium, added sugar, and  fat. Reduce your daily sodium intake. Most people with hypertension should eat less than 1,500 mg of sodium a day. Lifestyle  Work with your health care provider to maintain a healthy body weight or to lose weight. Ask what an ideal weight is for you. Get at least 30 minutes of exercise that causes your heart to beat faster (aerobic exercise) most days of the week. Activities may include walking, swimming, or biking. Include exercise to strengthen your muscles (resistance exercise), such as weight lifting, as part of your weekly exercise routine. Try to do these types of exercises for 30 minutes at least 3 days a week. Do not use any products that contain nicotine or tobacco, such as cigarettes, e-cigarettes, and chewing tobacco. If you need help quitting, ask your health care provider. Control any long-term (chronic) conditions you have, such as high cholesterol or diabetes. Identify your sources of stress and find ways to manage stress. This may include meditation, deep breathing, or making time for fun activities. Alcohol use Do not drink alcohol if: Your health care provider tells you not to drink. You are pregnant, may be pregnant, or are planning to become pregnant. If you  drink alcohol: Limit how much you use to: 0-1 drink a day for women. 0-2 drinks a day for men. Be aware of how much alcohol is in your drink. In the [place] one drink equals one 12 oz bottle of beer (355 mL), one 5 oz glass of wine (148 mL), or one 1 oz glass of hard liquor (44 mL). Medicines Your health care provider may prescribe medicine if lifestyle changes are not enough to get your blood pressure under control and if: Your systolic blood pressure is 130 or higher. Your diastolic blood pressure is 80 or higher. Take medicines only as told by your health care provider. Follow the directions carefully. Blood pressure medicines must be taken as told by your health care provider. The medicine does not work as well when  you skip doses. Skipping doses also puts you at risk for problems. Monitoring Before you monitor your blood pressure: Do not smoke, drink caffeinated beverages, or exercise within 30 minutes before taking a measurement. Use the bathroom and empty your bladder (urinate). Sit quietly for at least 5 minutes before taking measurements. Monitor your blood pressure at home as told by your health care provider. To do this: Sit with your back straight and supported. Place your feet flat on the floor. Do not cross your legs. Support your arm on a flat surface, such as a table. Make sure your upper arm is at heart level. Each time you measure, take two or three readings one minute apart and record the results. You may also need to have your blood pressure checked regularly by your health care provider. General information Talk with your health care provider about your diet, exercise habits, and other lifestyle factors that may be contributing to hypertension. Review all the medicines you take with your health care provider because there may be side effects or interactions. Keep all visits as told by your health care provider. Your health care provider can help you create and adjust your plan for managing your high blood pressure. Where to find more information National Heart, Lung, and Blood Institute: https://wilson-eaton.com/ American Heart Association: www.heart.org Contact a health care provider if: You think you are having a reaction to medicines you have taken. You have repeated (recurrent) headaches. You feel dizzy. You have swelling in your ankles. You have trouble with your vision. Get help right away if: You develop a severe headache or confusion. You have unusual weakness or numbness, or you feel faint. You have severe pain in your chest or abdomen. You vomit repeatedly. You have trouble breathing. These symptoms may represent a serious problem that is an emergency. Do not wait to see if the  symptoms will go away. Get medical help right away. Call your local emergency services (911 in the [place] Do not drive yourself to the hospital. Summary Hypertension is when the force of blood pumping through your arteries is too strong. If this condition is not controlled, it may put you at risk for serious complications. Your personal target blood pressure may vary depending on your medical conditions, your age, and other factors. For most people, a normal blood pressure is less than 120/80. Hypertension is managed by lifestyle changes, medicines, or both. Lifestyle changes to help manage hypertension include losing weight, eating a healthy, low-sodium diet, exercising more, stopping smoking, and limiting alcohol. This information is not intended to replace advice given to you by your health care provider. Make sure you discuss any questions you have with your health care provider. Document  Revised: 08/29/2019 Document Reviewed: 07/12/2019 Elsevier Patient Education  2022 Elsevier Inc. DASH Eating Plan DASH stands for Dietary Approaches to Stop Hypertension. The DASH eating plan is a healthy eating plan that has been shown to: Reduce high blood pressure (hypertension). Reduce your risk for type 2 diabetes, heart disease, and stroke. Help with weight loss. What are tips for following this plan? Reading food labels Check food labels for the amount of salt (sodium) per serving. Choose foods with less than 5 percent of the Daily Value of sodium. Generally, foods with less than 300 milligrams (mg) of sodium per serving fit into this eating plan. To find whole grains, look for the word "whole" as the first word in the ingredient list. Shopping Buy products labeled as "low-sodium" or "no salt added." Buy fresh foods. Avoid canned foods and pre-made or frozen meals. Cooking Avoid adding salt when cooking. Use salt-free seasonings or herbs instead of table salt or sea salt. Check with your health  care provider or pharmacist before using salt substitutes. Do not fry foods. Cook foods using healthy methods such as baking, boiling, grilling, roasting, and broiling instead. Cook with heart-healthy oils, such as olive, canola, avocado, soybean, or sunflower oil. Meal planning  Eat a balanced diet that includes: 4 or more servings of fruits and 4 or more servings of vegetables each day. Try to fill one-half of your plate with fruits and vegetables. 6-8 servings of whole grains each day. Less than 6 oz (170 g) of lean meat, poultry, or fish each day. A 3-oz (85-g) serving of meat is about the same size as a deck of cards. One egg equals 1 oz (28 g). 2-3 servings of low-fat dairy each day. One serving is 1 cup (237 mL). 1 serving of nuts, seeds, or beans 5 times each week. 2-3 servings of heart-healthy fats. Healthy fats called omega-3 fatty acids are found in foods such as walnuts, flaxseeds, fortified milks, and eggs. These fats are also found in cold-water fish, such as sardines, salmon, and mackerel. Limit how much you eat of: Canned or prepackaged foods. Food that is high in trans fat, such as some fried foods. Food that is high in saturated fat, such as fatty meat. Desserts and other sweets, sugary drinks, and other foods with added sugar. Full-fat dairy products. Do not salt foods before eating. Do not eat more than 4 egg yolks a week. Try to eat at least 2 vegetarian meals a week. Eat more home-cooked food and less restaurant, buffet, and fast food. Lifestyle When eating at a restaurant, ask that your food be prepared with less salt or no salt, if possible. If you drink alcohol: Limit how much you use to: 0-1 drink a day for women who are not pregnant. 0-2 drinks a day for men. Be aware of how much alcohol is in your drink. In the [place] one drink equals one 12 oz bottle of beer (355 mL), one 5 oz glass of wine (148 mL), or one 1 oz glass of hard liquor (44 mL). General  information Avoid eating more than 2,300 mg of salt a day. If you have hypertension, you may need to reduce your sodium intake to 1,500 mg a day. Work with your health care provider to maintain a healthy body weight or to lose weight. Ask what an ideal weight is for you. Get at least 30 minutes of exercise that causes your heart to beat faster (aerobic exercise) most days of the week. Activities may include  walking, swimming, or biking. Work with your health care provider or dietitian to adjust your eating plan to your individual calorie needs. What foods should I eat? Fruits All fresh, dried, or frozen fruit. Canned fruit in natural juice (without added sugar). Vegetables Fresh or frozen vegetables (raw, steamed, roasted, or grilled). Low-sodium or reduced-sodium tomato and vegetable juice. Low-sodium or reduced-sodium tomato sauce and tomato paste. Low-sodium or reduced-sodium canned vegetables. Grains Whole-grain or whole-wheat bread. Whole-grain or whole-wheat pasta. Brown rice. [place]. Bulgur. Whole-grain and low-sodium cereals. Pita bread. Low-fat, low-sodium crackers. Whole-wheat flour tortillas. Meats and other proteins Skinless chicken or [place]. Ground chicken or [place]. Pork with fat trimmed off. Fish and seafood. Egg whites. Dried beans, peas, or lentils. Unsalted nuts, nut butters, and seeds. Unsalted canned beans. Lean cuts of beef with fat trimmed off. Low-sodium, lean precooked or cured meat, such as sausages or meat loaves. Dairy Low-fat (1%) or fat-free (skim) milk. Reduced-fat, low-fat, or fat-free cheeses. Nonfat, low-sodium ricotta or cottage cheese. Low-fat or nonfat yogurt. Low-fat, low-sodium cheese. Fats and oils Soft margarine without trans fats. Vegetable oil. Reduced-fat, low-fat, or light mayonnaise and salad dressings (reduced-sodium). Canola, safflower, olive, avocado, soybean, and sunflower oils. Avocado. Seasonings and condiments Herbs. Spices. Seasoning  mixes without salt. Other foods Unsalted popcorn and pretzels. Fat-free sweets. The items listed above may not be a complete list of foods and beverages you can eat. Contact a dietitian for more information. What foods should I avoid? Fruits Canned fruit in a light or heavy syrup. Fried fruit. Fruit in cream or butter sauce. Vegetables Creamed or fried vegetables. Vegetables in a cheese sauce. Regular canned vegetables (not low-sodium or reduced-sodium). Regular canned tomato sauce and paste (not low-sodium or reduced-sodium). Regular tomato and vegetable juice (not low-sodium or reduced-sodium). Angie Fava. Olives. Grains Baked goods made with fat, such as croissants, muffins, or some breads. Dry pasta or rice meal packs. Meats and other proteins Fatty cuts of meat. Ribs. Fried meat. Berniece Salines. Bologna, salami, and other precooked or cured meats, such as sausages or meat loaves. Fat from the back of a pig (fatback). Bratwurst. Salted nuts and seeds. Canned beans with added salt. Canned or smoked fish. Whole eggs or egg yolks. Chicken or [place] with skin. Dairy Whole or 2% milk, cream, and half-and-half. Whole or full-fat cream cheese. Whole-fat or sweetened yogurt. Full-fat cheese. Nondairy creamers. Whipped toppings. Processed cheese and cheese spreads. Fats and oils Butter. Stick margarine. Lard. Shortening. Ghee. Bacon fat. Tropical oils, such as coconut, palm kernel, or palm oil. Seasonings and condiments Onion salt, garlic salt, seasoned salt, table salt, and sea salt. Worcestershire sauce. Tartar sauce. Barbecue sauce. Teriyaki sauce. Soy sauce, including reduced-sodium. Steak sauce. Canned and packaged gravies. Fish sauce. Oyster sauce. Cocktail sauce. Store-bought horseradish. Ketchup. Mustard. Meat flavorings and tenderizers. Bouillon cubes. Hot sauces. Pre-made or packaged marinades. Pre-made or packaged taco seasonings. Relishes. Regular salad dressings. Other foods Salted popcorn and  pretzels. The items listed above may not be a complete list of foods and beverages you should avoid. Contact a dietitian for more information. Where to find more information National Heart, Lung, and Blood Institute: https://wilson-eaton.com/ American Heart Association: www.heart.org Academy of Nutrition and Dietetics: www.eatright.[place]: www.kidney.org Summary The DASH eating plan is a healthy eating plan that has been shown to reduce high blood pressure (hypertension). It may also reduce your risk for type 2 diabetes, heart disease, and stroke. When on the DASH eating plan, aim to eat more fresh fruits and  vegetables, whole grains, lean proteins, low-fat dairy, and heart-healthy fats. With the DASH eating plan, you should limit salt (sodium) intake to 2,300 mg a day. If you have hypertension, you may need to reduce your sodium intake to 1,500 mg a day. Work with your health care provider or dietitian to adjust your eating plan to your individual calorie needs. This information is not intended to replace advice given to you by your health care provider. Make sure you discuss any questions you have with your health care provider. Document Revised: 07/15/2019 Document Reviewed: 07/15/2019 Elsevier Patient Education  2022 [employer].

## 2021-09-30 NOTE — Progress Notes (Signed)
Subjective:  Patient ID: Brett Roberts, male    DOB: Sep 18, 1980  Age: 41 y.o. MRN: [id number]  Chief Complaint  Patient presents with   Hyperlipidemia   Hypertension    HPI  Brett Roberts is a 40 year old Caucasian male that presents for follow-up of hyperlipidemia, GERD, and evaluation of elevated BP. States he has monitored BP recently, has consistently had elevated readings ranging 130s'-140's/ 80's-90's. BP 142/92. Denies chest pain, dyspnea, or headaches. He recently accepted a supervising position in administration. He tells me that he has experienced increased stress. Past family history of premature CORONARY ARTERY DISEASE. He has a past medical history of hyperlipidemia. He is not currently taking statin therapy.Previously prescribed Crestor. Current BMI 37.52. Fabion has OSA, he has not completed home sleep study recommended by cardiologist, Dr Agustin Cree.  Orley has chronic allergic rhinitis. He is currently followed by allergist Dr Nelva Bush. He recently underwent allergy testing, revealed multiple environmental allergens. Current treatment includes Atrovent nasal spray and Ryvent 6 mg 3-4 times daily. He is considering allergy injections for long-term treatment. States symptoms have improved.    Hypertension: He was last seen for hypertension 9 months ago.  BP at that visit was 130/80. Management since that visit includes diet  He is following a Regular diet. He is not exercising. He does not smoke.  Use of agents associated with hypertension: none.   Outside blood pressures are . Symptoms: No chest pain No chest pressure  No palpitations No syncope  No dyspnea No orthopnea  No paroxysmal nocturnal dyspnea No lower extremity edema   Pertinent labs: Lab Results  Component Value Date   CHOL 229 (H) 01/04/2021   HDL 43 01/04/2021   LDLCALC 172 (H) 01/04/2021   TRIG 81 01/04/2021   CHOLHDL 5.3 (H) 01/04/2021   Lab Results  Component Value Date   NA 140 01/04/2021   K 4.8  01/04/2021   CREATININE 1.00 01/04/2021   EGFR 98 01/04/2021   GFRNONAA >60 04/21/2020   GLUCOSE 97 01/04/2021     The 10-year ASCVD risk score (Arnett DK, et al., 2019) is: 2.4%    Lipid/Cholesterol, Follow-up  Last lipid panel Other pertinent labs  Lab Results  Component Value Date   CHOL 229 (H) 01/04/2021   HDL 43 01/04/2021   LDLCALC 172 (H) 01/04/2021   TRIG 81 01/04/2021   CHOLHDL 5.3 (H) 01/04/2021   Lab Results  Component Value Date   ALT 20 01/04/2021   AST 13 01/04/2021   PLT 249 01/04/2021   TSH 1.580 01/04/2021     He was last seen for this 9 months ago.  Management since that visit includes diet, previously prescribed Crestor, not taking currently  The 10-year ASCVD risk score (Arnett DK, et al., 2019) is: 2.4%  GERD, Follow up:  The patient was last seen for GERD 9 months ago. Current treatment is Nexium and avoiding GERD triggers. He reports good compliance with treatment. He is not having side effects. Marland Kitchen He is NOT experiencing belching and eructation   Current Outpatient Medications on File Prior to Visit  Medication Sig Dispense Refill   azelastine (ASTELIN) 0.1 % nasal spray Place 1 spray into both nostrils 2 (two) times daily. Use in each nostril as directed 30 mL 12   Carbinoxamine Maleate (RYVENT) 6 MG TABS 65m tablet up to 3-4 times daily 120 tablet 5   Esomeprazole Magnesium (NEXIUM PO) Take by mouth.     ipratropium (ATROVENT) 0.06 % nasal spray 1-2 sprays per  nostril twice daily and up to 4 times a day 15 mL 5   levocetirizine (XYZAL) 5 MG tablet Take 5 mg by mouth every evening.     montelukast (SINGULAIR) 10 MG tablet Take 1 tablet (10 mg total) by mouth at bedtime. 90 tablet 3   Current Facility-Administered Medications on File Prior to Visit  Medication Dose Route Frequency Provider Last Rate Last Admin   triamcinolone acetonide (KENALOG-40) injection 20 mg  20 mg Intra-articular Once [name], Elnita Maxwell, MD       Past Medical History:   Diagnosis Date   Acid reflux    Past Surgical History:  Procedure Laterality Date   NO PAST SURGERIES      Family History  Problem Relation Age of Onset   Hypertension Mother    Heart attack Father    Hypertension Sister    Hyperlipidemia Brother    Atopy Other    Eczema Other    Urticaria Other    Immunodeficiency Other    Asthma Other    Angioedema Other    Allergic rhinitis Other    Social History   Socioeconomic History   Marital status: Married    Spouse name: Not on file   Number of children: Not on file   Years of education: Not on file   Highest education level: Not on file  Occupational History   Not on file  Tobacco Use   Smoking status: Never   Smokeless tobacco: Never  Substance and Sexual Activity   Alcohol use: Yes    Alcohol/week: 1.0 - 2.0 standard drink    Types: 1 - 2 Cans of beer per week    Comment: twice weekly    Drug use: Never   Sexual activity: Yes  Other Topics Concern   Not on file  Social History Narrative   Not on file   Social Determinants of Health   Financial Resource Strain: Not on file  Food Insecurity: Not on file  Transportation Needs: Not on file  Physical Activity: Not on file  Stress: Not on file  Social Connections: Not on file    Review of Systems  Constitutional:  Negative for chills, diaphoresis, fatigue and fever.  HENT:  Negative for congestion, ear pain and sore throat.   Respiratory:  Negative for cough and shortness of breath.   Cardiovascular:  Negative for chest pain and leg swelling.  Gastrointestinal:  Negative for abdominal pain, constipation, diarrhea, nausea and vomiting.  Genitourinary:  Negative for dysuria and urgency.  Musculoskeletal:  Negative for arthralgias and myalgias.  Neurological:  Positive for headaches. Negative for dizziness.  Psychiatric/Behavioral:  Negative for dysphoric mood.     Objective:  BP (!) 142/82    Pulse 85    Temp (!) 95.8 F (35.4 C)    Ht _0  (1.803 m)     Wt 269 lb (122 kg)    SpO2 99%    BMI 37.52 kg/m   BP/Weight 09/30/2021 09/17/2021 88/41/6606  Systolic BP 301 601 093  Diastolic BP 82 92 235  Wt. (Lbs) 269 269.8 266  BMI 37.52 37.63 37.1    Physical Exam Vitals reviewed.  Constitutional:      Appearance: Normal appearance.  HENT:     Head: Normocephalic.     Right Ear: Tympanic membrane normal.     Left Ear: Tympanic membrane normal.     Nose: Congestion present.     Mouth/Throat:     Mouth: Mucous membranes are moist.  Pharynx: Posterior oropharyngeal erythema present.  Eyes:     Pupils: Pupils are equal, round, and reactive to light.  Cardiovascular:     Rate and Rhythm: Normal rate and regular rhythm.     Pulses: Normal pulses.     Heart sounds: Normal heart sounds.  Pulmonary:     Effort: Pulmonary effort is normal.     Breath sounds: Normal breath sounds.  Abdominal:     General: Bowel sounds are normal.     Palpations: Abdomen is soft.  Musculoskeletal:        General: Normal range of motion.     Cervical back: Neck supple.  Skin:    General: Skin is warm and dry.     Capillary Refill: Capillary refill takes less than 2 seconds.  Neurological:     General: No focal deficit present.     Mental Status: He is alert and oriented to person, place, and time.  Psychiatric:        Mood and Affect: Mood normal.        Behavior: Behavior normal.     Lab Results  Component Value Date   WBC 6.1 01/04/2021   HGB 14.6 01/04/2021   HCT 43.0 01/04/2021   PLT 249 01/04/2021   GLUCOSE 97 01/04/2021   CHOL 229 (H) 01/04/2021   TRIG 81 01/04/2021   HDL 43 01/04/2021   LDLCALC 172 (H) 01/04/2021   ALT 20 01/04/2021   AST 13 01/04/2021   NA 140 01/04/2021   K 4.8 01/04/2021   CL 103 01/04/2021   CREATININE 1.00 01/04/2021   BUN 18 01/04/2021   CO2 23 01/04/2021   TSH 1.580 01/04/2021      Assessment & Plan:   1. Mixed hyperlipidemia-not at goal - CBC with Differential/Platelet - Comprehensive metabolic  panel - Lipid panel - TSH  2. Essential hypertension-not at goal - CBC with Differential/Platelet - Comprehensive metabolic panel - Lipid panel - TSH - chlorthalidone (HYGROTON) 25 MG tablet; Take 1 tablet (25 mg total) by mouth daily.  Dispense: 30 tablet; Refill: 0  3. BMI 37.0-37.9, adult - CBC with Differential/Platelet - Comprehensive metabolic panel - Lipid panel - TSH  4. Need for vaccination - Tdap vaccine greater than or equal to 7yo IM  5. Gastroesophageal reflux disease, unspecified whether esophagitis present-stable -continue Nexium -continue to avoid GERD triggers  6. Chronic allergic rhinitis-improved -continue Ryvent and Atrovent nasal spray -follow-up with Dr Nelva Bush as scheduled       Begin Chlorthalidone 25 mg for hypertension We will call you with lab results Monitor BP, keep log Low salt diet Increase physical activity Follow-up in 2 weeks Tdap updated today in office  Follow-up: 2-weeks  An After Visit Summary was printed and given to the patient.  I, Rip Harbour, NP, have reviewed all documentation for this visit. The documentation on 09/30/21 for the exam, diagnosis, procedures, and orders are all accurate and complete.    Signed, Rip Harbour, NP [place] [phone]

## 2021-10-01 ENCOUNTER — Other Ambulatory Visit: Payer: Self-pay

## 2021-10-01 DIAGNOSIS — E785 Hyperlipidemia, unspecified: Secondary | ICD-10-CM

## 2021-10-01 LAB — CBC WITH DIFFERENTIAL/PLATELET
Basophils Absolute: 0 10*3/uL (ref 0.0–0.2)
Basos: 1 %
EOS (ABSOLUTE): 0.1 10*3/uL (ref 0.0–0.4)
Eos: 2 %
Hematocrit: 44.7 % (ref 37.5–51.0)
Hemoglobin: 14.9 g/dL (ref 13.0–17.7)
Immature Grans (Abs): 0 10*3/uL (ref 0.0–0.1)
Immature Granulocytes: 0 %
Lymphocytes Absolute: 2.4 10*3/uL (ref 0.7–3.1)
Lymphs: 43 %
MCH: 28.8 pg (ref 26.6–33.0)
MCHC: 33.3 g/dL (ref 31.5–35.7)
MCV: 87 fL (ref 79–97)
Monocytes Absolute: 0.5 10*3/uL (ref 0.1–0.9)
Monocytes: 8 %
Neutrophils Absolute: 2.6 10*3/uL (ref 1.4–7.0)
Neutrophils: 46 %
Platelets: 286 10*3/uL (ref 150–450)
RBC: 5.17 x10E6/uL (ref 4.14–5.80)
RDW: 13.5 % (ref 11.6–15.4)
WBC: 5.6 10*3/uL (ref 3.4–10.8)

## 2021-10-01 LAB — COMPREHENSIVE METABOLIC PANEL
ALT: 26 IU/L (ref 0–44)
AST: 21 IU/L (ref 0–40)
Albumin/Globulin Ratio: 1.7 (ref 1.2–2.2)
Albumin: 4.5 g/dL (ref 4.0–5.0)
Alkaline Phosphatase: 95 IU/L (ref 44–121)
BUN/Creatinine Ratio: 17 (ref 9–20)
BUN: 17 mg/dL (ref 6–24)
Bilirubin Total: 0.3 mg/dL (ref 0.0–1.2)
CO2: 24 mmol/L (ref 20–29)
Calcium: 9 mg/dL (ref 8.7–10.2)
Chloride: 102 mmol/L (ref 96–106)
Creatinine, Ser: 1.03 mg/dL (ref 0.76–1.27)
Globulin, Total: 2.7 g/dL (ref 1.5–4.5)
Glucose: 93 mg/dL (ref 70–99)
Potassium: 4.2 mmol/L (ref 3.5–5.2)
Sodium: 140 mmol/L (ref 134–144)
Total Protein: 7.2 g/dL (ref 6.0–8.5)
eGFR: 94 mL/min/{1.73_m2} (ref >59)

## 2021-10-01 LAB — LIPID PANEL
Chol/HDL Ratio: 4.3 ratio (ref 0.0–5.0)
Cholesterol, Total: 218 mg/dL — ABNORMAL HIGH (ref 100–199)
HDL: 51 mg/dL (ref >39)
LDL Chol Calc (NIH): 153 mg/dL — ABNORMAL HIGH (ref 0–99)
Triglycerides: 78 mg/dL (ref 0–149)
VLDL Cholesterol Cal: 14 mg/dL (ref 5–40)

## 2021-10-01 LAB — CARDIOVASCULAR RISK ASSESSMENT

## 2021-10-01 LAB — TSH: TSH: 1.88 u[IU]/mL (ref 0.450–4.500)

## 2021-10-01 MED ORDER — ROSUVASTATIN CALCIUM 10 MG PO TABS: 10.0000 mg | ORAL_TABLET | Freq: Every day | ORAL | 1 refills | Status: DC

## 2021-10-24 ENCOUNTER — Telehealth: Payer: Self-pay | Admitting: Allergy

## 2021-10-24 NOTE — Telephone Encounter (Signed)
Patient states his insurance has denied 2 prior authorizations for Ryvent. Patient is wondering if there is anything else we can do to provide him with this medication or if an alternative can be sent in. ? ?Best pharmacy- ?[facility]  ?

## 2021-10-24 NOTE — Telephone Encounter (Signed)
I could not find a record of denied authorizations on CoverMyMeds. I did submit a PA for the Ryvent on CoverMyMeds and it is pending. ?

## 2021-10-28 NOTE — Telephone Encounter (Signed)
PA was denied. He must try/fail Carbinoxamine 4 mg tablets. Please advise if you would like the dose change.  ?

## 2021-10-29 ENCOUNTER — Other Ambulatory Visit: Payer: Self-pay | Admitting: *Deleted

## 2021-10-29 MED ORDER — CARBINOXAMINE MALEATE 4 MG PO TABS: ORAL_TABLET | ORAL | 5 refills | Status: DC

## 2021-10-29 NOTE — Telephone Encounter (Signed)
RX sent and [facility] informed.  ?

## 2021-11-23 ENCOUNTER — Other Ambulatory Visit: Payer: Self-pay | Admitting: Family Medicine

## 2021-11-23 DIAGNOSIS — E785 Hyperlipidemia, unspecified: Secondary | ICD-10-CM

## 2021-11-25 NOTE — Telephone Encounter (Signed)
Refill sent to pharmacy.   

## 2021-12-04 ENCOUNTER — Ambulatory Visit: Payer: BC Managed Care – PPO | Admitting: Cardiology

## 2022-01-01 ENCOUNTER — Ambulatory Visit (INDEPENDENT_AMBULATORY_CARE_PROVIDER_SITE_OTHER): Payer: BC Managed Care – PPO | Admitting: Nurse Practitioner

## 2022-01-01 ENCOUNTER — Encounter: Payer: Self-pay | Admitting: Nurse Practitioner

## 2022-01-01 VITALS — BP 152/102 | HR 74 | Temp 97.5°F | Ht 70.0 in | Wt 267.0 lb

## 2022-01-01 DIAGNOSIS — E782 Mixed hyperlipidemia: Secondary | ICD-10-CM | POA: Diagnosis not present

## 2022-01-01 DIAGNOSIS — I10 Essential (primary) hypertension: Secondary | ICD-10-CM | POA: Diagnosis not present

## 2022-01-01 DIAGNOSIS — R110 Nausea: Secondary | ICD-10-CM

## 2022-01-01 DIAGNOSIS — K219 Gastro-esophageal reflux disease without esophagitis: Secondary | ICD-10-CM | POA: Diagnosis not present

## 2022-01-01 DIAGNOSIS — Z6838 Body mass index (BMI) 38.0-38.9, adult: Secondary | ICD-10-CM

## 2022-01-01 MED ORDER — SEMAGLUTIDE-WEIGHT MANAGEMENT 0.5 MG/0.5ML ~~LOC~~ SOAJ: 0.5000 mg | SUBCUTANEOUS | 0 refills | Status: AC

## 2022-01-01 MED ORDER — WEGOVY 0.25 MG/0.5ML ~~LOC~~ SOAJ: 0.2500 mg | SUBCUTANEOUS | 0 refills | Status: DC

## 2022-01-01 MED ORDER — CHLORTHALIDONE 25 MG PO TABS: 25.0000 mg | ORAL_TABLET | Freq: Every day | ORAL | 1 refills | Status: DC

## 2022-01-01 MED ORDER — SEMAGLUTIDE-WEIGHT MANAGEMENT 1.7 MG/0.75ML ~~LOC~~ SOAJ: 1.7000 mg | SUBCUTANEOUS | 0 refills | Status: AC

## 2022-01-01 MED ORDER — SEMAGLUTIDE-WEIGHT MANAGEMENT 2.4 MG/0.75ML ~~LOC~~ SOAJ: 2.4000 mg | SUBCUTANEOUS | 0 refills | Status: AC

## 2022-01-01 MED ORDER — ONDANSETRON HCL 4 MG PO TABS: 4.0000 mg | ORAL_TABLET | Freq: Three times a day (TID) | ORAL | 0 refills | Status: DC | PRN

## 2022-01-01 MED ORDER — SEMAGLUTIDE-WEIGHT MANAGEMENT 1 MG/0.5ML ~~LOC~~ SOAJ: 1.0000 mg | SUBCUTANEOUS | 0 refills | Status: DC

## 2022-01-01 NOTE — Progress Notes (Signed)
? ?Subjective:  ?Patient ID: Brett Roberts, male    DOB: 1981/07/05  Age: 41 y.o. MRN: [id number] ? ?CC: ?HTN ? ?HPI ? Brett Roberts is a 41 year old Caucasian male that presents for follow-up of HTN, hyperlipidemia, and GERD. States he is concerned about obesity. Current weight 267 lbs, BMI 38.31. States he was prescribed Wegovy previously but was unable to obtain prescription due to supply chain issues.  ? ?Hypertension, follow-up: ? ?He was last seen for hypertension 3 months ago.  ?BP at that visit was 142/82. Management since that visit includes chlorthalidone 25 mg patient states he took for 1 month. ? ?He reports fair compliance with treatment. ?He is not having side effects. ?He is following a Regular diet. ?He is not exercising. ?He does not smoke. ? ?Use of agents associated with hypertension: NSAIDS ? ?09/30/21 BP 142/82; 10/01/21 140/110; 10/02/21 137/105; 10/03/21 128/89; 10/06/21 128/89; 10/18/21 129/85; 10/19/21 128/83 ? ? ?Lipid/Cholesterol, Follow-up ? ?Last lipid panel Other pertinent labs  ?Lab Results  ?Component Value Date  ? CHOL 218 (H) 09/30/2021  ? HDL 51 09/30/2021  ? LDLCALC 153 (H) 09/30/2021  ? TRIG 78 09/30/2021  ? CHOLHDL 4.3 09/30/2021  ? Lab Results  ?Component Value Date  ? ALT 26 09/30/2021  ? AST 21 09/30/2021  ? PLT 286 09/30/2021  ? TSH 1.880 09/30/2021  ?  ? ?He was last seen for this 3 months ago.  ?Management since that visit includes Crestor 10 mg. ? ?He reports excellent compliance with treatment. ?He is not having side effects.  ? ?Current diet: well balanced ?Current exercise: none ? ?The 10-year ASCVD risk score (Arnett DK, et al., 2019) is: 2.3%  ? ?GERD, Follow up: ? ?The patient was last seen for GERD 3 months ago. ?Current treatment is Nexium PRN ?        He reports fair compliance with treatment  ?         He is NOT experiencing belching and eructation, cough, difficulty swallowing, or heartburn ? ? ?Pertinent labs: ?Lab Results  ?Component Value Date  ? CHOL 218 (H)  09/30/2021  ? HDL 51 09/30/2021  ? LDLCALC 153 (H) 09/30/2021  ? TRIG 78 09/30/2021  ? CHOLHDL 4.3 09/30/2021  ? Lab Results  ?Component Value Date  ? NA 140 09/30/2021  ? K 4.2 09/30/2021  ? CREATININE 1.03 09/30/2021  ? EGFR 94 09/30/2021  ? GFRNONAA >60 04/21/2020  ? GLUCOSE 93 09/30/2021  ?  ? ?The 10-year ASCVD risk score (Arnett DK, et al., 2019) is: 2%  ? ?Current Outpatient Medications on File Prior to Visit  ?Medication Sig Dispense Refill  ? Carbinoxamine Maleate 4 MG TABS Take 2 tablets twice daily 120 tablet 5  ? chlorthalidone (HYGROTON) 25 MG tablet Take 1 tablet (25 mg total) by mouth daily. 30 tablet 0  ? Esomeprazole Magnesium (NEXIUM PO) Take by mouth.    ? ipratropium (ATROVENT) 0.06 % nasal spray 1-2 sprays per nostril twice daily and up to 4 times a day 15 mL 5  ? montelukast (SINGULAIR) 10 MG tablet Take 1 tablet (10 mg total) by mouth at bedtime. 90 tablet 3  ? rosuvastatin (CRESTOR) 10 MG tablet Take 1 tablet (10 mg total) by mouth daily. 30 tablet 1  ? ?No current facility-administered medications on file prior to visit.  ? ?Past Medical History:  ?Diagnosis Date  ? Acid reflux   ? ?Past Surgical History:  ?Procedure Laterality Date  ? NO PAST SURGERIES    ?  ?  Family History  ?Problem Relation Age of Onset  ? Hypertension Mother   ? Heart attack Father   ? Hypertension Sister   ? Hyperlipidemia Brother   ? Atopy Other   ? Eczema Other   ? Urticaria Other   ? Immunodeficiency Other   ? Asthma Other   ? Angioedema Other   ? Allergic rhinitis Other   ? ?Social History  ? ?Socioeconomic History  ? Marital status: Married  ?  Spouse name: Not on file  ? Number of children: Not on file  ? Years of education: Not on file  ? Highest education level: Not on file  ?Occupational History  ? Not on file  ?Tobacco Use  ? Smoking status: Never  ? Smokeless tobacco: Never  ?Substance and Sexual Activity  ? Alcohol use: Yes  ?  Alcohol/week: 1.0 - 2.0 standard drink  ?  Types: 1 - 2 Cans of beer per week  ?   Comment: twice weekly   ? Drug use: Never  ? Sexual activity: Yes  ?Other Topics Concern  ? Not on file  ?Social History Narrative  ? Not on file  ? ?Social Determinants of Health  ? ?Financial Resource Strain: Not on file  ?Food Insecurity: Not on file  ?Transportation Needs: Not on file  ?Physical Activity: Not on file  ?Stress: Not on file  ?Social Connections: Not on file  ? ? ?Review of Systems  ?Constitutional:  Positive for fatigue. Negative for chills, diaphoresis and fever.  ?HENT:  Negative for congestion, ear pain and sore throat.   ?Respiratory:  Negative for cough and shortness of breath.   ?Cardiovascular:  Negative for chest pain and leg swelling.  ?Gastrointestinal:  Negative for abdominal pain, constipation, diarrhea, nausea and vomiting.  ?Genitourinary:  Negative for dysuria and urgency.  ?Musculoskeletal:  Negative for arthralgias and myalgias.  ?Allergic/Immunologic: Positive for environmental allergies.  ?Neurological:  Negative for dizziness and headaches.  ?Psychiatric/Behavioral:  Negative for dysphoric mood.   ? ? ?Objective:  ?BP (!) 152/102   Pulse 74   Temp (!) 97.5 ?F (36.4 ?C)   Ht _0  (1.778 m)   Wt 267 lb (121.1 kg)   SpO2 98%   BMI 38.31 kg/m?   ? ?  09/30/2021  ?  8:40 AM 09/17/2021  ?  2:14 PM 08/20/2021  ?  3:33 PM  ?BP/Weight  ?Systolic BP 643 329 518  ?Diastolic BP 82 92 841  ?Wt. (Lbs) 269 269.8 266  ?BMI 37.52 kg/m2 37.63 kg/m2 37.1 kg/m2  ? ? ?Physical Exam ?Vitals reviewed.  ?Constitutional:   ?   Appearance: He is obese.  ?Cardiovascular:  ?   Rate and Rhythm: Normal rate and regular rhythm.  ?   Pulses: Normal pulses.  ?   Heart sounds: Normal heart sounds.  ?Pulmonary:  ?   Effort: Pulmonary effort is normal.  ?   Breath sounds: Normal breath sounds.  ?Abdominal:  ?   General: Bowel sounds are normal.  ?   Palpations: Abdomen is soft.  ?Musculoskeletal:     ?   General: Normal range of motion.  ?Skin: ?   General: Skin is warm and dry.  ?   Capillary Refill:  Capillary refill takes less than 2 seconds.  ?Neurological:  ?   General: No focal deficit present.  ?   Mental Status: He is alert and oriented to person, place, and time.  ?Psychiatric:     ?   Mood and  Affect: Mood normal.     ?   Behavior: Behavior normal.  ? ? ? ?  ? ?Lab Results  ?Component Value Date  ? WBC 5.6 09/30/2021  ? HGB 14.9 09/30/2021  ? HCT 44.7 09/30/2021  ? PLT 286 09/30/2021  ? GLUCOSE 93 09/30/2021  ? CHOL 218 (H) 09/30/2021  ? TRIG 78 09/30/2021  ? HDL 51 09/30/2021  ? LDLCALC 153 (H) 09/30/2021  ? ALT 26 09/30/2021  ? AST 21 09/30/2021  ? NA 140 09/30/2021  ? K 4.2 09/30/2021  ? CL 102 09/30/2021  ? CREATININE 1.03 09/30/2021  ? BUN 17 09/30/2021  ? CO2 24 09/30/2021  ? TSH 1.880 09/30/2021  ? ? ? ? ?Assessment & Plan:  ? ?1. Essential hypertension-not at goal ?- chlorthalidone (HYGROTON) 25 MG tablet; Take 1 tablet (25 mg total) by mouth daily.  Dispense: 90 tablet; Refill: 1 ?-DASH diet ? ?2. Mixed hyperlipidemia-not at goal ?-continue Crestor 10 mg daily ?-increase physical activity ? ?3. Gastroesophageal reflux disease, unspecified whether esophagitis present-well controlled  ?- CBC with Differential/Platelet; Future ?- Comprehensive metabolic panel; Future ?-continue to avoid foods that trigger GERD ?-continue Nexium as prescribed ? ?4. Class 2 severe obesity due to excess calories with serious comorbidity and body mass index (BMI) of 38.0 to 38.9 in adult [facility]) ?- Semaglutide-Weight Management 0.5 MG/0.5ML SOAJ; Inject 0.5 mg into the skin once a week for 28 days.  Dispense: 2 mL; Refill: 0 ?- Semaglutide-Weight Management 1 MG/0.5ML SOAJ; Inject 1 mg into the skin once a week for 28 days.  Dispense: 2 mL; Refill: 0 ?- Semaglutide-Weight Management 1.7 MG/0.75ML SOAJ; Inject 1.7 mg into the skin once a week for 28 days.  Dispense: 3 mL; Refill: 0 ?- Semaglutide-Weight Management 2.4 MG/0.75ML SOAJ; Inject 2.4 mg into the skin once a week for 28 days.  Dispense: 3 mL; Refill: 0 ?-  Semaglutide-Weight Management (WEGOVY) 0.25 MG/0.5ML SOAJ; Inject 0.25 mg into the skin once a week.  Dispense: 2 mL; Refill: 0 ?- CBC with Differential/Platelet; Future ?- Comprehensive metabolic panel; Future ?- Lipid pan

## 2022-01-01 NOTE — Patient Instructions (Addendum)
Begin Wegovy 0.25 mg injection weekly for 4 weeks, then increase to 0.5 mg injection for 4 weeks, then increase to 1 mg injection weekly for 4 weeks, then increase to 1.7 mg injection weekly for 4 weeks, then increase to 2 .4 mg injection weekly  ?Take Zofran 4 mg as needed for nausea  ?Take Chlorthalidone 25 mg daily for hypertension ?Keep BP log ?Return in 4 weeks for follow up, bring BP log ? ? ? ?Managing Your Hypertension ?Hypertension, also called high blood pressure, is when the force of the blood pressing against the walls of the arteries is too strong. Arteries are blood vessels that carry blood from your heart throughout your body. Hypertension forces the heart to work harder to pump blood and may cause the arteries to become narrow or stiff. ?Understanding blood pressure readings ?A blood pressure reading includes a higher number over a lower number: ?The first, or top, number is called the systolic pressure. It is a measure of the pressure in your arteries as your heart beats. ?The second, or bottom number, is called the diastolic pressure. It is a measure of the pressure in your arteries as the heart relaxes. ?For most people, a normal blood pressure is below 120/80. Your personal target blood pressure may vary depending on your medical conditions, your age, and other factors. ?Blood pressure is classified into four stages. Based on your blood pressure reading, your health care provider may use the following stages to determine what type of treatment you need, if any. Systolic pressure and diastolic pressure are measured in a unit called millimeters of mercury (mmHg). ?Normal ?Systolic pressure: below 123456. ?Diastolic pressure: below 80. ?Elevated ?Systolic pressure: [id number]. ?Diastolic pressure: below 80. ?Hypertension stage 1 ?Systolic pressure: 0000000. ?Diastolic pressure: [id number]. ?Hypertension stage 2 ?Systolic pressure: [id number] or above. ?Diastolic pressure: 90 or above. ?How can this condition affect  me? ?Managing your hypertension is very important. Over time, hypertension can damage the arteries and decrease blood flow to parts of the body, including the brain, heart, and kidneys. Having untreated or uncontrolled hypertension can lead to: ?A heart attack. ?A stroke. ?A weakened blood vessel (aneurysm). ?Heart failure. ?Kidney damage. ?Eye damage. ?Memory and concentration problems. ?Vascular dementia. ?What actions can I take to manage this condition? ?Hypertension can be managed by making lifestyle changes and possibly by taking medicines. Your health care provider will help you make a plan to bring your blood pressure within a normal range. You may be referred for counseling on a healthy diet and physical activity. ?Nutrition ? ?Eat a diet that is high in fiber and potassium, and low in salt (sodium), added sugar, and fat. An example eating plan is called the DASH diet. DASH stands for Dietary Approaches to Stop Hypertension. To eat this way: ?Eat plenty of fresh fruits and vegetables. Try to fill one-half of your plate at each meal with fruits and vegetables. ?Eat whole grains, such as whole-wheat pasta, brown rice, or whole-grain bread. Fill about one-fourth of your plate with whole grains. ?Eat low-fat dairy products. ?Avoid fatty cuts of meat, processed or cured meats, and poultry with skin. Fill about one-fourth of your plate with lean proteins such as fish, chicken without skin, beans, eggs, and tofu. ?Avoid pre-made and processed foods. These tend to be higher in sodium, added sugar, and fat. ?Reduce your daily sodium intake. Many people with hypertension should eat less than 1,500 mg of sodium a day. ?Lifestyle ? ?Work with your health care provider to maintain a  healthy body weight or to lose weight. Ask what an ideal weight is for you. ?Get at least 30 minutes of exercise that causes your heart to beat faster (aerobic exercise) most days of the week. Activities may include walking, swimming, or  biking. ?Include exercise to strengthen your muscles (resistance exercise), such as weight lifting, as part of your weekly exercise routine. Try to do these types of exercises for 30 minutes at least 3 days a week. ?Do not use any products that contain nicotine or tobacco. These products include cigarettes, chewing tobacco, and vaping devices, such as e-cigarettes. If you need help quitting, ask your health care provider. ?Control any long-term (chronic) conditions you have, such as high cholesterol or diabetes. ?Identify your sources of stress and find ways to manage stress. This may include meditation, deep breathing, or making time for fun activities. ?Alcohol use ?Do not drink alcohol if: ?Your health care provider tells you not to drink. ?You are pregnant, may be pregnant, or are planning to become pregnant. ?If you drink alcohol: ?Limit how much you have to: ?0-1 drink a day for women. ?0-2 drinks a day for men. ?Know how much alcohol is in your drink. In the [place] one drink equals one 12 oz bottle of beer (355 mL), one 5 oz glass of wine (148 mL), or one 1? oz glass of hard liquor (44 mL). ?Medicines ?Your health care provider may prescribe medicine if lifestyle changes are not enough to get your blood pressure under control and if: ?Your systolic blood pressure is 130 or higher. ?Your diastolic blood pressure is 80 or higher. ?Take medicines only as told by your health care provider. Follow the directions carefully. Blood pressure medicines must be taken as told by your health care provider. The medicine does not work as well when you skip doses. Skipping doses also puts you at risk for problems. ?Monitoring ?Before you monitor your blood pressure: ?Do not smoke, drink caffeinated beverages, or exercise within 30 minutes before taking a measurement. ?Use the bathroom and empty your bladder (urinate). ?Sit quietly for at least 5 minutes before taking measurements. ?Monitor your blood pressure at home as told  by your health care provider. To do this: ?Sit with your back straight and supported. ?Place your feet flat on the floor. Do not cross your legs. ?Support your arm on a flat surface, such as a table. Make sure your upper arm is at heart level. ?Each time you measure, take two or three readings one minute apart and record the results. ?You may also need to have your blood pressure checked regularly by your health care provider. ?General information ?Talk with your health care provider about your diet, exercise habits, and other lifestyle factors that may be contributing to hypertension. ?Review all the medicines you take with your health care provider because there may be side effects or interactions. ?Keep all follow-up visits. Your health care provider can help you create and adjust your plan for managing your high blood pressure. ?Where to find more information ?National Heart, Lung, and Blood Institute: https://wilson-eaton.com/ ?American Heart Association: www.heart.org ?Contact a health care provider if: ?You think you are having a reaction to medicines you have taken. ?You have repeated (recurrent) headaches. ?You feel dizzy. ?You have swelling in your ankles. ?You have trouble with your vision. ?Get help right away if: ?You develop a severe headache or confusion. ?You have unusual weakness or numbness, or you feel faint. ?You have severe pain in your chest or abdomen. ?  You vomit repeatedly. ?You have trouble breathing. ?These symptoms may be an emergency. Get help right away. Call 911. ?Do not wait to see if the symptoms will go away. ?Do not drive yourself to the hospital. ?Summary ?Hypertension is when the force of blood pumping through your arteries is too strong. If this condition is not controlled, it may put you at risk for serious complications. ?Your personal target blood pressure may vary depending on your medical conditions, your age, and other factors. For most people, a normal blood pressure is less than  120/80. ?Hypertension is managed by lifestyle changes, medicines, or both. ?Lifestyle changes to help manage hypertension include losing weight, eating a healthy, low-sodium diet, exercising more, stoppin

## 2022-01-02 ENCOUNTER — Ambulatory Visit: Payer: BC Managed Care – PPO | Admitting: Family Medicine

## 2022-01-14 ENCOUNTER — Ambulatory Visit: Payer: BC Managed Care – PPO | Admitting: Allergy

## 2022-01-14 DIAGNOSIS — J309 Allergic rhinitis, unspecified: Secondary | ICD-10-CM

## 2022-01-24 ENCOUNTER — Telehealth: Payer: Self-pay

## 2022-01-24 NOTE — Telephone Encounter (Signed)
Patient called wanting to know if he can try something different until Reginal Lutes is back in stock.

## 2022-01-30 ENCOUNTER — Encounter: Payer: Self-pay | Admitting: Nurse Practitioner

## 2022-01-30 ENCOUNTER — Ambulatory Visit (INDEPENDENT_AMBULATORY_CARE_PROVIDER_SITE_OTHER): Payer: BC Managed Care – PPO | Admitting: Nurse Practitioner

## 2022-01-30 VITALS — BP 96/68 | HR 85 | Resp 18 | Ht 70.0 in | Wt 254.0 lb

## 2022-01-30 DIAGNOSIS — K219 Gastro-esophageal reflux disease without esophagitis: Secondary | ICD-10-CM

## 2022-01-30 DIAGNOSIS — Z6838 Body mass index (BMI) 38.0-38.9, adult: Secondary | ICD-10-CM | POA: Diagnosis not present

## 2022-01-30 DIAGNOSIS — J301 Allergic rhinitis due to pollen: Secondary | ICD-10-CM | POA: Diagnosis not present

## 2022-01-30 MED ORDER — SEMAGLUTIDE(0.25 OR 0.5MG/DOS) 2 MG/1.5ML ~~LOC~~ SOPN: 0.5000 mg | PEN_INJECTOR | SUBCUTANEOUS | 0 refills | Status: DC

## 2022-01-30 MED ORDER — MONTELUKAST SODIUM 10 MG PO TABS: 10.0000 mg | ORAL_TABLET | Freq: Every day | ORAL | 3 refills | Status: DC

## 2022-01-30 NOTE — Progress Notes (Signed)
Subjective:  Patient ID: Brett Roberts, male    DOB: 1980/10/18  Age: 41 y.o. MRN: [id number]  Chief Complaint  Patient presents with   Hypertension    HPI  Brett Roberts is a 41 year old male that presents for follow-up of weight management and hypertension. We was prescribed Wegovy to assist with weight loss to improve overall health. He has lost 11 pounds in 4 weeks. Current weight  254 lbs, BMI 36 Denies side effects of Wegovy. He has modified his diet and increased physical activity. States he has had difficulty finding Scientist, research (physical sciences) at pharmacies. Medication is in short supply due to high demand.   Pt is prescribed Chlorthalidone 25 mg QD for hypertension. States he has experienced intermittent dizziness and light-headedness with position changes, especially squatting down. Symptoms subside approximately 10 seconds afterwards. In-office BP 96/68. Pt not symptomatic. Pt states he is not pushing fluids.      Current Outpatient Medications on File Prior to Visit  Medication Sig Dispense Refill   chlorthalidone (HYGROTON) 25 MG tablet Take 1 tablet (25 mg total) by mouth daily. 90 tablet 1   Esomeprazole Magnesium (NEXIUM PO) Take by mouth.     montelukast (SINGULAIR) 10 MG tablet Take 1 tablet (10 mg total) by mouth at bedtime. 90 tablet 3   ondansetron (ZOFRAN) 4 MG tablet Take 1 tablet (4 mg total) by mouth every 8 (eight) hours as needed for nausea or vomiting. 20 tablet 0   rosuvastatin (CRESTOR) 10 MG tablet Take 1 tablet (10 mg total) by mouth daily. 30 tablet 1   Semaglutide-Weight Management (WEGOVY) 0.25 MG/0.5ML SOAJ Inject 0.25 mg into the skin once a week. 2 mL 0   Semaglutide-Weight Management (WEGOVY) 0.25 MG/0.5ML SOAJ Inject 0.25 mg into the skin once a week. 2 mL 0   Semaglutide-Weight Management 0.5 MG/0.5ML SOAJ Inject 0.5 mg into the skin once a week for 28 days. 2 mL 0   [START ON 02/28/2022] Semaglutide-Weight Management 1 MG/0.5ML SOAJ Inject 1 mg into the skin once a week for  28 days. 2 mL 0   [START ON 03/29/2022] Semaglutide-Weight Management 1.7 MG/0.75ML SOAJ Inject 1.7 mg into the skin once a week for 28 days. 3 mL 0   [START ON 04/27/2022] Semaglutide-Weight Management 2.4 MG/0.75ML SOAJ Inject 2.4 mg into the skin once a week for 28 days. 3 mL 0   No current facility-administered medications on file prior to visit.   Past Medical History:  Diagnosis Date   Acid reflux    Past Surgical History:  Procedure Laterality Date   NO PAST SURGERIES      Family History  Problem Relation Age of Onset   Hypertension Mother    Heart attack Father    Hypertension Sister    Hyperlipidemia Brother    Atopy Other    Eczema Other    Urticaria Other    Immunodeficiency Other    Asthma Other    Angioedema Other    Allergic rhinitis Other    Social History   Socioeconomic History   Marital status: Married    Spouse name: Not on file   Number of children: Not on file   Years of education: Not on file   Highest education level: Not on file  Occupational History   Not on file  Tobacco Use   Smoking status: Never   Smokeless tobacco: Never  Substance and Sexual Activity   Alcohol use: Yes    Alcohol/week: 1.0 - 2.0 standard drink of  alcohol    Types: 1 - 2 Cans of beer per week    Comment: twice weekly    Drug use: Never   Sexual activity: Yes  Other Topics Concern   Not on file  Social History Narrative   Not on file   Social Determinants of Health   Financial Resource Strain: Not on file  Food Insecurity: Not on file  Transportation Needs: Not on file  Physical Activity: Not on file  Stress: Not on file  Social Connections: Not on file    Review of Systems  Neurological:  Positive for dizziness and light-headedness.     Objective:  BP 96/68   Pulse 85   Resp 18   Ht 5\' 10"  (1.778 m)   Wt 254 lb (115.2 kg)   SpO2 96%   BMI 36.45 kg/m      01/30/2022    7:37 AM 01/01/2022    2:52 PM 09/30/2021    8:40 AM  BP/Weight  Systolic BP 96  152 142  Diastolic BP 68 102 82  Wt. (Lbs) 254 267 269  BMI 36.45 kg/m2 38.31 kg/m2 37.52 kg/m2    Physical Exam Vitals reviewed.  Constitutional:      Appearance: He is obese.  Cardiovascular:     Rate and Rhythm: Normal rate and regular rhythm.     Pulses: Normal pulses.     Heart sounds: Normal heart sounds.  Skin:    General: Skin is warm and dry.     Capillary Refill: Capillary refill takes less than 2 seconds.  Neurological:     General: No focal deficit present.     Mental Status: He is oriented to person, place, and time.  Psychiatric:        Mood and Affect: Mood normal.        Behavior: Behavior normal.         Lab Results  Component Value Date   WBC 5.6 09/30/2021   HGB 14.9 09/30/2021   HCT 44.7 09/30/2021   PLT 286 09/30/2021   GLUCOSE 93 09/30/2021   CHOL 218 (H) 09/30/2021   TRIG 78 09/30/2021   HDL 51 09/30/2021   LDLCALC 153 (H) 09/30/2021   ALT 26 09/30/2021   AST 21 09/30/2021   NA 140 09/30/2021   K 4.2 09/30/2021   CL 102 09/30/2021   CREATININE 1.03 09/30/2021   BUN 17 09/30/2021   CO2 24 09/30/2021   TSH 1.880 09/30/2021      Assessment & Plan:   1. Non-seasonal allergic rhinitis due to pollen - montelukast (SINGULAIR) 10 MG tablet; Take 1 tablet (10 mg total) by mouth at bedtime.  Dispense: 90 tablet; Refill: 3  2. Class 2 severe obesity due to excess calories with serious comorbidity and body mass index (BMI) of 38.0 to 38.9 in adult (HCC) - VITAMIN D 25 Hydroxy (Vit-D Deficiency, Fractures) - CBC with Differential/Platelet - Comprehensive metabolic panel - Lipid panel - TSH - Semaglutide,0.25 or 0.5MG /DOS, 2 MG/1.5ML SOPN; Inject 0.5 mg into the skin once a week.  Dispense: 3 mL; Refill: 0 -continue heart healthy diet and regular physical activity       Follow-up: 55-months, fasting  An After Visit Summary was printed and given to the patient.  I, 2-month, NP, have reviewed all documentation for this visit.  The documentation on 01/30/22 for the exam, diagnosis, procedures, and orders are all accurate and complete.    Signed, 04/01/22, NP [name] (  [phone] 

## 2022-01-30 NOTE — Patient Instructions (Signed)
Semaglutide Injection (Weight Management) ?What is this medication? ?SEMAGLUTIDE (SEM a GLOO tide) promotes weight loss. It may also be used to maintain weight loss. It works by decreasing appetite. Changes to diet and exercise are often combined with this medication. ?This medicine may be used for other purposes; ask your health care provider or pharmacist if you have questions. ?COMMON BRAND NAME(S): Wegovy ?What should I tell my care team before I take this medication? ?They need to know if you have any of these conditions: ?Endocrine tumors (MEN 2) or if someone in your family had these tumors ?Eye disease, vision problems ?Gallbladder disease ?History of depression or mental health disease ?History of pancreatitis ?Kidney disease ?Stomach or intestine problems ?Suicidal thoughts, plans, or attempt; a previous suicide attempt by you or a family member ?Thyroid cancer or if someone in your family had thyroid cancer ?An unusual or allergic reaction to semaglutide, other medications, foods, dyes, or preservatives ?Pregnant or trying to get pregnant ?Breast-feeding ?How should I use this medication? ?This medication is injected under the skin. You will be taught how to prepare and give it. Take it as directed on the prescription label. It is given once every week (every 7 days). Keep taking it unless your care team tells you to stop. ?It is important that you put your used needles and pens in a special sharps container. Do not put them in a trash can. If you do not have a sharps container, call your pharmacist or care team to get one. ?A special MedGuide will be given to you by the pharmacist with each prescription and refill. Be sure to read this information carefully each time. ?This medication comes with INSTRUCTIONS FOR USE. Ask your pharmacist for directions on how to use this medication. Read the information carefully. Talk to your pharmacist or care team if you have questions. ?Talk to your care team about  the use of this medication in children. While it may be prescribed for children as young as 12 years for selected conditions, precautions do apply. ?Overdosage: If you think you have taken too much of this medicine contact a poison control center or emergency room at once. ?NOTE: This medicine is only for you. Do not share this medicine with others. ?What if I miss a dose? ?If you miss a dose and the next scheduled dose is more than 2 days away, take the missed dose as soon as possible. If you miss a dose and the next scheduled dose is less than 2 days away, do not take the missed dose. Take the next dose at your regular time. Do not take double or extra doses. If you miss your dose for 2 weeks or more, take the next dose at your regular time or call your care team to talk about how to restart this medication. ?What may interact with this medication? ?Insulin and other medications for diabetes ?This list may not describe all possible interactions. Give your health care provider a list of all the medicines, herbs, non-prescription drugs, or dietary supplements you use. Also tell them if you smoke, drink alcohol, or use illegal drugs. Some items may interact with your medicine. ?What should I watch for while using this medication? ?Visit your care team for regular checks on your progress. It may be some time before you see the benefit from this medication. ?Drink plenty of fluids while taking this medication. Check with your care team if you have severe diarrhea, nausea, and vomiting, or if you sweat a   lot. The loss of too much body fluid may make it dangerous for you to take this medication. ?This medication may affect blood sugar levels. Ask your care team if changes in diet or medications are needed if you have diabetes. ?If you or your family notice any changes in your behavior, such as new or worsening depression, thoughts of harming yourself, anxiety, other unusual or disturbing thoughts, or memory loss, call  your care team right away. ?Women should inform their care team if they wish to become pregnant or think they might be pregnant. Losing weight while pregnant is not advised and may cause harm to the unborn child. Talk to your care team for more information. ?What side effects may I notice from receiving this medication? ?Side effects that you should report to your care team as soon as possible: ?Allergic reactions--skin rash, itching, hives, swelling of the face, lips, tongue, or throat ?Change in vision ?Dehydration--increased thirst, dry mouth, feeling faint or lightheaded, headache, dark yellow or brown urine ?Gallbladder problems--severe stomach pain, nausea, vomiting, fever ?Heart palpitations--rapid, pounding, or irregular heartbeat ?Kidney injury--decrease in the amount of urine, swelling of the ankles, hands, or feet ?Pancreatitis--severe stomach pain that spreads to your back or gets worse after eating or when touched, fever, nausea, vomiting ?Thoughts of suicide or self-harm, worsening mood, feelings of depression ?Thyroid cancer--new mass or lump in the neck, pain or trouble swallowing, trouble breathing, hoarseness ?Side effects that usually do not require medical attention (report to your care team if they continue or are bothersome): ?Diarrhea ?Loss of appetite ?Nausea ?Stomach pain ?Vomiting ?This list may not describe all possible side effects. Call your doctor for medical advice about side effects. You may report side effects to FDA at 1-800-FDA-1088. ?Where should I keep my medication? ?Keep out of the reach of children and pets. ?Refrigeration (preferred): Store in the refrigerator. Do not freeze. Keep this medication in the original container until you are ready to take it. Get rid of any unused medication after the expiration date. ?Room temperature: If needed, prior to cap removal, the pen can be stored at room temperature for up to 28 days. Protect from light. If it is stored at room  temperature, get rid of any unused medication after 28 days or after it expires, whichever is first. ?It is important to get rid of the medication as soon as you no longer need it or it is expired. You can do this in two ways: ?Take the medication to a medication take-back program. Check with your pharmacy or law enforcement to find a location. ?If you cannot return the medication, follow the directions in the MedGuide. ?NOTE: This sheet is a summary. It may not cover all possible information. If you have questions about this medicine, talk to your doctor, pharmacist, or health care provider. ?? 2023 [employer]vier/Gold Standard (2021-08-28 00:00:00) ? ?

## 2022-01-31 LAB — CBC WITH DIFFERENTIAL/PLATELET
Basophils Absolute: 0 10*3/uL (ref 0.0–0.2)
Basos: 0 %
EOS (ABSOLUTE): 0.1 10*3/uL (ref 0.0–0.4)
Eos: 1 %
Hematocrit: 41.7 % (ref 37.5–51.0)
Hemoglobin: 14.5 g/dL (ref 13.0–17.7)
Immature Grans (Abs): 0 10*3/uL (ref 0.0–0.1)
Immature Granulocytes: 0 %
Lymphocytes Absolute: 2.3 10*3/uL (ref 0.7–3.1)
Lymphs: 33 %
MCH: 30 pg (ref 26.6–33.0)
MCHC: 34.8 g/dL (ref 31.5–35.7)
MCV: 86 fL (ref 79–97)
Monocytes Absolute: 0.6 10*3/uL (ref 0.1–0.9)
Monocytes: 9 %
Neutrophils Absolute: 4 10*3/uL (ref 1.4–7.0)
Neutrophils: 57 %
Platelets: 253 10*3/uL (ref 150–450)
RBC: 4.84 x10E6/uL (ref 4.14–5.80)
RDW: 12.7 % (ref 11.6–15.4)
WBC: 7 10*3/uL (ref 3.4–10.8)

## 2022-01-31 LAB — TSH: TSH: 1.25 u[IU]/mL (ref 0.450–4.500)

## 2022-01-31 LAB — COMPREHENSIVE METABOLIC PANEL
ALT: 25 IU/L (ref 0–44)
AST: 20 IU/L (ref 0–40)
Albumin/Globulin Ratio: 1.6 (ref 1.2–2.2)
Albumin: 4.6 g/dL (ref 4.0–5.0)
Alkaline Phosphatase: 73 IU/L (ref 44–121)
BUN/Creatinine Ratio: 16 (ref 9–20)
BUN: 22 mg/dL (ref 6–24)
Bilirubin Total: 0.5 mg/dL (ref 0.0–1.2)
CO2: 26 mmol/L (ref 20–29)
Calcium: 9.5 mg/dL (ref 8.7–10.2)
Chloride: 99 mmol/L (ref 96–106)
Creatinine, Ser: 1.37 mg/dL — ABNORMAL HIGH (ref 0.76–1.27)
Globulin, Total: 2.8 g/dL (ref 1.5–4.5)
Glucose: 102 mg/dL — ABNORMAL HIGH (ref 70–99)
Potassium: 4 mmol/L (ref 3.5–5.2)
Sodium: 141 mmol/L (ref 134–144)
Total Protein: 7.4 g/dL (ref 6.0–8.5)
eGFR: 66 mL/min/{1.73_m2} (ref >59)

## 2022-01-31 LAB — CARDIOVASCULAR RISK ASSESSMENT

## 2022-01-31 LAB — LIPID PANEL
Chol/HDL Ratio: 3.2 ratio (ref 0.0–5.0)
Cholesterol, Total: 115 mg/dL (ref 100–199)
HDL: 36 mg/dL — ABNORMAL LOW (ref >39)
LDL Chol Calc (NIH): 63 mg/dL (ref 0–99)
Triglycerides: 78 mg/dL (ref 0–149)
VLDL Cholesterol Cal: 16 mg/dL (ref 5–40)

## 2022-01-31 LAB — VITAMIN D 25 HYDROXY (VIT D DEFICIENCY, FRACTURES): Vit D, 25-Hydroxy: 27.9 ng/mL — ABNORMAL LOW (ref 30.0–100.0)

## 2022-02-07 LAB — SPECIMEN STATUS REPORT

## 2022-02-07 LAB — HGB A1C W/O EAG: Hgb A1c MFr Bld: 5.6 % (ref 4.8–5.6)

## 2022-02-10 ENCOUNTER — Other Ambulatory Visit: Payer: Self-pay | Admitting: Nurse Practitioner

## 2022-02-10 ENCOUNTER — Other Ambulatory Visit (HOSPITAL_COMMUNITY): Payer: Self-pay

## 2022-02-10 ENCOUNTER — Other Ambulatory Visit: Payer: Self-pay

## 2022-02-10 MED ORDER — SEMAGLUTIDE-WEIGHT MANAGEMENT 1 MG/0.5ML ~~LOC~~ SOAJ
1.0000 mg | SUBCUTANEOUS | 0 refills | Status: AC
  Filled 2022-02-10: qty 2, 28d supply, fill #0

## 2022-02-10 MED ORDER — VITAMIN D (ERGOCALCIFEROL) 1.25 MG (50000 UNIT) PO CAPS: 50000.0000 [IU] | ORAL_CAPSULE | ORAL | 2 refills | Status: DC

## 2022-02-18 ENCOUNTER — Other Ambulatory Visit (HOSPITAL_COMMUNITY): Payer: Self-pay

## 2022-02-24 ENCOUNTER — Telehealth: Payer: Self-pay

## 2022-02-24 NOTE — Telephone Encounter (Signed)
PA submitted and denied for wegovy via covermymeds 02/10/22. No explanation for denial received from insurance at this time.

## 2022-02-25 ENCOUNTER — Other Ambulatory Visit: Payer: Self-pay | Admitting: Family Medicine

## 2022-02-25 DIAGNOSIS — E785 Hyperlipidemia, unspecified: Secondary | ICD-10-CM

## 2022-02-27 ENCOUNTER — Ambulatory Visit: Payer: BC Managed Care – PPO | Admitting: Cardiology

## 2022-05-05 DIAGNOSIS — H5213 Myopia, bilateral: Secondary | ICD-10-CM | POA: Diagnosis not present

## 2022-05-06 ENCOUNTER — Ambulatory Visit: Payer: BC Managed Care – PPO | Admitting: Family Medicine

## 2022-05-13 ENCOUNTER — Ambulatory Visit: Payer: BC Managed Care – PPO | Admitting: Family Medicine

## 2022-05-13 ENCOUNTER — Encounter: Payer: Self-pay | Admitting: Family Medicine

## 2022-05-20 ENCOUNTER — Other Ambulatory Visit: Payer: Self-pay | Admitting: Nurse Practitioner

## 2022-06-10 DIAGNOSIS — Z23 Encounter for immunization: Secondary | ICD-10-CM | POA: Diagnosis not present

## 2022-07-21 ENCOUNTER — Ambulatory Visit: Payer: BC Managed Care – PPO | Admitting: Nurse Practitioner

## 2022-07-21 ENCOUNTER — Encounter: Payer: Self-pay | Admitting: Nurse Practitioner

## 2022-07-21 VITALS — BP 138/80 | HR 75 | Temp 97.4°F | Ht 70.0 in | Wt 277.3 lb

## 2022-07-21 DIAGNOSIS — J018 Other acute sinusitis: Secondary | ICD-10-CM | POA: Diagnosis not present

## 2022-07-21 DIAGNOSIS — H65113 Acute and subacute allergic otitis media (mucoid) (sanguinous) (serous), bilateral: Secondary | ICD-10-CM

## 2022-07-21 MED ORDER — AZELASTINE HCL 0.1 % NA SOLN: 1.0000 | Freq: Two times a day (BID) | NASAL | 12 refills | Status: DC

## 2022-07-21 MED ORDER — TRIAMCINOLONE ACETONIDE 40 MG/ML IJ SUSP
60.0000 mg | Freq: Once | INTRAMUSCULAR | Status: AC
  Administered 2022-07-21: 60 mg via INTRAMUSCULAR

## 2022-07-21 MED ORDER — PROMETHAZINE-DM 6.25-15 MG/5ML PO SYRP: 5.0000 mL | ORAL_SOLUTION | Freq: Four times a day (QID) | ORAL | 0 refills | Status: DC | PRN

## 2022-07-21 MED ORDER — AMOXICILLIN-POT CLAVULANATE 875-125 MG PO TABS: 1.0000 | ORAL_TABLET | Freq: Two times a day (BID) | ORAL | 0 refills | Status: DC

## 2022-07-21 NOTE — Patient Instructions (Signed)
Take antibiotics as prescribed Use Astelin nasal spray daily Take Promethazine-DM up to 4 times daily for cough/sinus congestion Follow-up as needed    Sinus Infection, Adult A sinus infection is soreness and swelling (inflammation) of your sinuses. Sinuses are hollow spaces in the bones around your face. They are located: Around your eyes. In the middle of your forehead. Behind your nose. In your cheekbones. Your sinuses and nasal passages are lined with a fluid called mucus. Mucus drains out of your sinuses. Swelling can trap mucus in your sinuses. This lets germs (bacteria, virus, or fungus) grow, which leads to infection. Most of the time, this condition is caused by a virus. What are the causes? Allergies. Asthma. Germs. Things that block your nose or sinuses. Growths in the nose (nasal polyps). Chemicals or irritants in the air. A fungus. This is rare. What increases the risk? Having a weak body defense system (immune system). Doing a lot of swimming or diving. Using nasal sprays too much. Smoking. What are the signs or symptoms? The main symptoms of this condition are pain and a feeling of pressure around the sinuses. Other symptoms include: Stuffy nose (congestion). This may make it hard to breathe through your nose. Runny nose (drainage). Soreness, swelling, and warmth in the sinuses. A cough that may get worse at night. Being unable to smell and taste. Mucus that collects in the throat or the back of the nose (postnasal drip). This may cause a sore throat or bad breath. Being very tired (fatigued). A fever. How is this diagnosed? Your symptoms. Your medical history. A physical exam. Tests to find out if your condition is short-term (acute) or long-term (chronic). Your doctor may: Check your nose for growths (polyps). Check your sinuses using a tool that has a light on one end (endoscope). Check for allergies or germs. Do imaging tests, such as an MRI or CT  scan. How is this treated? Treatment for this condition depends on the cause and whether it is short-term or long-term. If caused by a virus, your symptoms should go away on their own within 10 days. You may be given medicines to relieve symptoms. They include: Medicines that shrink swollen tissue in the nose. A spray that treats swelling of the nostrils. Rinses that help get rid of thick mucus in your nose (nasal saline washes). Medicines that treat allergies (antihistamines). Over-the-counter pain relievers. If caused by bacteria, your doctor may wait to see if you will get better without treatment. You may be given antibiotic medicine if you have: A very bad infection. A weak body defense system. If caused by growths in the nose, surgery may be needed. Follow these instructions at home: Medicines Take, use, or apply over-the-counter and prescription medicines only as told by your doctor. These may include nasal sprays. If you were prescribed an antibiotic medicine, take it as told by your doctor. Do not stop taking it even if you start to feel better. Hydrate and humidify  Drink enough water to keep your pee (urine) pale yellow. Use a cool mist humidifier to keep the humidity level in your home above 50%. Breathe in steam for 10-15 minutes, 3-4 times a day, or as told by your doctor. You can do this in the bathroom while a hot shower is running. Try not to spend time in cool or dry air. Rest Rest as much as you can. Sleep with your head raised (elevated). Make sure you get enough sleep each night. General instructions  Put a  warm, moist washcloth on your face 3-4 times a day, or as often as told by your doctor. Use nasal saline washes as often as told by your doctor. Wash your hands often with soap and water. If you cannot use soap and water, use hand sanitizer. Do not smoke. Avoid being around people who are smoking (secondhand smoke). Keep all follow-up visits. Contact a doctor  if: You have a fever. Your symptoms get worse. Your symptoms do not get better within 10 days. Get help right away if: You have a very bad headache. You cannot stop vomiting. You have very bad pain or swelling around your face or eyes. You have trouble seeing. You feel confused. Your neck is stiff. You have trouble breathing. These symptoms may be an emergency. Get help right away. Call 911. Do not wait to see if the symptoms will go away. Do not drive yourself to the hospital. Summary A sinus infection is swelling of your sinuses. Sinuses are hollow spaces in the bones around your face. This condition is caused by tissues in your nose that become inflamed or swollen. This traps germs. These can lead to infection. If you were prescribed an antibiotic medicine, take it as told by your doctor. Do not stop taking it even if you start to feel better. Keep all follow-up visits. This information is not intended to replace advice given to you by your health care provider. Make sure you discuss any questions you have with your health care provider. Document Revised: 07/16/2021 Document Reviewed: 07/16/2021 Elsevier Patient Education  [employer].

## 2022-07-21 NOTE — Progress Notes (Signed)
Acute Office Visit  Subjective:    Patient ID: Brett Roberts, male    DOB: Aug 01, 1981, 41 y.o.   MRN: [id number]  Chief Complaint  Patient presents with   Cough    HPI: Patient is in today for cough and congestions.  Patient states that this has been going on since Thanksgiving.  Past Medical History:  Diagnosis Date   Acid reflux     Past Surgical History:  Procedure Laterality Date   NO PAST SURGERIES      Family History  Problem Relation Age of Onset   Hypertension Mother    Heart attack Father    Hypertension Sister    Hyperlipidemia Brother    Atopy Other    Eczema Other    Urticaria Other    Immunodeficiency Other    Asthma Other    Angioedema Other    Allergic rhinitis Other     Social History   Socioeconomic History   Marital status: Married    Spouse name: Not on file   Number of children: Not on file   Years of education: Not on file   Highest education level: Not on file  Occupational History   Not on file  Tobacco Use   Smoking status: Never   Smokeless tobacco: Never  Substance and Sexual Activity   Alcohol use: Yes    Alcohol/week: 1.0 - 2.0 standard drink of alcohol    Types: 1 - 2 Cans of beer per week    Comment: twice weekly    Drug use: Never   Sexual activity: Yes  Other Topics Concern   Not on file  Social History Narrative   Not on file   Social Determinants of Health   Financial Resource Strain: Not on file  Food Insecurity: Not on file  Transportation Needs: Not on file  Physical Activity: Not on file  Stress: Not on file  Social Connections: Not on file  Intimate Partner Violence: Not on file    Outpatient Medications Prior to Visit  Medication Sig Dispense Refill   chlorthalidone (HYGROTON) 25 MG tablet Take 1 tablet (25 mg total) by mouth daily. 90 tablet 1   Esomeprazole Magnesium (NEXIUM PO) Take by mouth.     montelukast (SINGULAIR) 10 MG tablet Take 1 tablet (10 mg total) by mouth at bedtime. 90 tablet 3    montelukast (SINGULAIR) 10 MG tablet Take 1 tablet (10 mg total) by mouth at bedtime. 90 tablet 3   ondansetron (ZOFRAN) 4 MG tablet Take 1 tablet (4 mg total) by mouth every 8 (eight) hours as needed for nausea or vomiting. 20 tablet 0   rosuvastatin (CRESTOR) 10 MG tablet Take 1 tablet (10 mg total) by mouth daily. 90 tablet 1   Semaglutide,0.25 or 0.5MG/DOS, 2 MG/1.5ML SOPN Inject 0.5 mg into the skin once a week. 3 mL 0   Semaglutide-Weight Management (WEGOVY) 0.25 MG/0.5ML SOAJ Inject 0.25 mg into the skin once a week. 2 mL 0   Semaglutide-Weight Management (WEGOVY) 0.25 MG/0.5ML SOAJ Inject 0.25 mg into the skin once a week. 2 mL 0   Vitamin D, Ergocalciferol, (DRISDOL) 1.25 MG (50000 UNIT) CAPS capsule Take 1 capsule (50,000 Units total) by mouth every 7 (seven) days. 5 capsule 2   No facility-administered medications prior to visit.    No Known Allergies  Review of Systems  Constitutional:  Negative for chills, fatigue and fever.  HENT:  Positive for congestion and ear pain. Negative for sore throat.  Respiratory:  Positive for cough and wheezing. Negative for shortness of breath.   Cardiovascular:  Negative for chest pain and palpitations.  Gastrointestinal:  Negative for abdominal pain, constipation, diarrhea, nausea and vomiting.  Endocrine: Negative for polydipsia, polyphagia and polyuria.  Genitourinary:  Negative for dysuria and frequency.  Musculoskeletal:  Negative for arthralgias and back pain.  Neurological:  Positive for headaches. Negative for dizziness.  Psychiatric/Behavioral:  Negative for dysphoric mood. The patient is not nervous/anxious.        Objective:    Physical Exam  There were no vitals taken for this visit. Wt Readings from Last 3 Encounters:  01/30/22 254 lb (115.2 kg)  01/01/22 267 lb (121.1 kg)  09/30/21 269 lb (122 kg)    Health Maintenance Due  Topic Date Due   INFLUENZA VACCINE  Never done    There are no preventive care reminders  to display for this patient.   Lab Results  Component Value Date   TSH 1.250 01/30/2022   Lab Results  Component Value Date   WBC 7.0 01/30/2022   HGB 14.5 01/30/2022   HCT 41.7 01/30/2022   MCV 86 01/30/2022   PLT 253 01/30/2022   Lab Results  Component Value Date   NA 141 01/30/2022   K 4.0 01/30/2022   CO2 26 01/30/2022   GLUCOSE 102 (H) 01/30/2022   BUN 22 01/30/2022   CREATININE 1.37 (H) 01/30/2022   BILITOT 0.5 01/30/2022   ALKPHOS 73 01/30/2022   AST 20 01/30/2022   ALT 25 01/30/2022   PROT 7.4 01/30/2022   ALBUMIN 4.6 01/30/2022   CALCIUM 9.5 01/30/2022   ANIONGAP 7 04/21/2020   EGFR 66 01/30/2022   Lab Results  Component Value Date   CHOL 115 01/30/2022   Lab Results  Component Value Date   HDL 36 (L) 01/30/2022   Lab Results  Component Value Date   LDLCALC 63 01/30/2022   Lab Results  Component Value Date   TRIG 78 01/30/2022   Lab Results  Component Value Date   CHOLHDL 3.2 01/30/2022   Lab Results  Component Value Date   HGBA1C 5.6 01/30/2022       Assessment & Plan:   Problem List Items Addressed This Visit   None  No orders of the defined types were placed in this encounter.   No orders of the defined types were placed in this encounter.    Follow-up: No follow-ups on file.  An After Visit Summary was printed and given to the patient.  Rip Harbour, NP [place] [phone]

## 2022-07-21 NOTE — Progress Notes (Signed)
Acute Office Visit  Subjective:    Patient ID: Brett Roberts, male    DOB: [birth year], 41 y.o.   MRN: [id number]  CC: URI  HPI: Patient is in today for URI  Upper respiratory symptoms He complains of congestion, nasal congestion, non productive cough, and post nasal drip. Denies fever, chills, night sweats or weight loss. Onset of symptoms was a few days ago and staying constant.He is drinking plenty of fluids, OTC cold remedies, Singulair, and Claritin.  Past history is significant for  chronic allergic rhinitis . Patient is non-smoker   Past Medical History:  Diagnosis Date   Acid reflux     Past Surgical History:  Procedure Laterality Date   NO PAST SURGERIES      Family History  Problem Relation Age of Onset   Hypertension Mother    Heart attack Father    Hypertension Sister    Hyperlipidemia Brother    Atopy Other    Eczema Other    Urticaria Other    Immunodeficiency Other    Asthma Other    Angioedema Other    Allergic rhinitis Other     Social History   Socioeconomic History   Marital status: Married    Spouse name: Not on file   Number of children: Not on file   Years of education: Not on file   Highest education level: Not on file  Occupational History   Not on file  Tobacco Use   Smoking status: Never   Smokeless tobacco: Never  Substance and Sexual Activity   Alcohol use: Yes    Alcohol/week: 1.0 - 2.0 standard drink of alcohol    Types: 1 - 2 Cans of beer per week    Comment: twice weekly    Drug use: Never   Sexual activity: Yes  Other Topics Concern   Not on file  Social History Narrative   Not on file   Social Determinants of Health   Financial Resource Strain: Not on file  Food Insecurity: Not on file  Transportation Needs: Not on file  Physical Activity: Not on file  Stress: Not on file  Social Connections: Not on file  Intimate Partner Violence: Not on file    Outpatient Medications Prior to Visit  Medication Sig  Dispense Refill   chlorthalidone (HYGROTON) 25 MG tablet Take 1 tablet (25 mg total) by mouth daily. 90 tablet 1   Esomeprazole Magnesium (NEXIUM PO) Take by mouth.     montelukast (SINGULAIR) 10 MG tablet Take 1 tablet (10 mg total) by mouth at bedtime. 90 tablet 3   montelukast (SINGULAIR) 10 MG tablet Take 1 tablet (10 mg total) by mouth at bedtime. 90 tablet 3   ondansetron (ZOFRAN) 4 MG tablet Take 1 tablet (4 mg total) by mouth every 8 (eight) hours as needed for nausea or vomiting. 20 tablet 0   rosuvastatin (CRESTOR) 10 MG tablet Take 1 tablet (10 mg total) by mouth daily. 90 tablet 1   Semaglutide,0.25 or 0.5MG/DOS, 2 MG/1.5ML SOPN Inject 0.5 mg into the skin once a week. 3 mL 0   Semaglutide-Weight Management (WEGOVY) 0.25 MG/0.5ML SOAJ Inject 0.25 mg into the skin once a week. 2 mL 0   Semaglutide-Weight Management (WEGOVY) 0.25 MG/0.5ML SOAJ Inject 0.25 mg into the skin once a week. 2 mL 0   Vitamin D, Ergocalciferol, (DRISDOL) 1.25 MG (50000 UNIT) CAPS capsule Take 1 capsule (50,000 Units total) by mouth every 7 (seven) days. 5 capsule 2  No facility-administered medications prior to visit.    No Known Allergies  Review of Systems See pertinent positives and negatives per HPI.     Objective:    Physical Exam Vitals reviewed.  Constitutional:      Appearance: He is well-developed.  HENT:     Right Ear: Swelling and tenderness present. Tympanic membrane is erythematous.     Left Ear: Swelling and tenderness present. Tympanic membrane is erythematous.     Nose: Congestion and rhinorrhea present.     Mouth/Throat:     Pharynx: Posterior oropharyngeal erythema present.  Cardiovascular:     Rate and Rhythm: Normal rate and regular rhythm.  Skin:    General: Skin is warm.     Capillary Refill: Capillary refill takes less than 2 seconds.  Neurological:     Mental Status: He is alert.      Wt Readings from Last 3 Encounters:  01/30/22 254 lb (115.2 kg)  01/01/22 267  lb (121.1 kg)  09/30/21 269 lb (122 kg)    Health Maintenance Due  Topic Date Due   INFLUENZA VACCINE  Never done       Lab Results  Component Value Date   TSH 1.250 01/30/2022   Lab Results  Component Value Date   WBC 7.0 01/30/2022   HGB 14.5 01/30/2022   HCT 41.7 01/30/2022   MCV 86 01/30/2022   PLT 253 01/30/2022   Lab Results  Component Value Date   NA 141 01/30/2022   K 4.0 01/30/2022   CO2 26 01/30/2022   GLUCOSE 102 (H) 01/30/2022   BUN 22 01/30/2022   CREATININE 1.37 (H) 01/30/2022   BILITOT 0.5 01/30/2022   ALKPHOS 73 01/30/2022   AST 20 01/30/2022   ALT 25 01/30/2022   PROT 7.4 01/30/2022   ALBUMIN 4.6 01/30/2022   CALCIUM 9.5 01/30/2022   ANIONGAP 7 04/21/2020   EGFR 66 01/30/2022   Lab Results  Component Value Date   CHOL 115 01/30/2022   Lab Results  Component Value Date   HDL 36 (L) 01/30/2022   Lab Results  Component Value Date   LDLCALC 63 01/30/2022   Lab Results  Component Value Date   TRIG 78 01/30/2022   Lab Results  Component Value Date   CHOLHDL 3.2 01/30/2022   Lab Results  Component Value Date   HGBA1C 5.6 01/30/2022       Assessment & Plan:   1. Acute non-recurrent sinusitis of other sinus - amoxicillin-clavulanate (AUGMENTIN) 875-125 MG tablet; Take 1 tablet by mouth 2 (two) times daily.  Dispense: 20 tablet; Refill: 0 - promethazine-dextromethorphan (PROMETHAZINE-DM) 6.25-15 MG/5ML syrup; Take 5 mLs by mouth 4 (four) times daily as needed.  Dispense: 118 mL; Refill: 0 - triamcinolone acetonide (KENALOG-40) injection 60 mg - azelastine (ASTELIN) 0.1 % nasal spray; Place 1 spray into both nostrils 2 (two) times daily. Use in each nostril as directed  Dispense: 30 mL; Refill: 12  2. Acute allergic otitis media of both ears, recurrence not specified - amoxicillin-clavulanate (AUGMENTIN) 875-125 MG tablet; Take 1 tablet by mouth 2 (two) times daily.  Dispense: 20 tablet; Refill: 0    Take antibiotics as  prescribed Use Astelin nasal spray daily Take Promethazine-DM up to 4 times daily for cough/sinus congestion Follow-up as needed      Follow-up: PRN  An After Visit Summary was printed and given to the patient.  I, Rip Harbour, NP, have reviewed all documentation for this visit. The documentation on 07/21/22  for the exam, diagnosis, procedures, and orders are all accurate and complete.    Signed, Rip Harbour, NP [place] [phone]

## 2022-08-18 ENCOUNTER — Other Ambulatory Visit: Payer: Self-pay | Admitting: Nurse Practitioner

## 2022-10-16 ENCOUNTER — Encounter: Payer: Self-pay | Admitting: Family Medicine

## 2022-10-16 ENCOUNTER — Ambulatory Visit: Payer: BC Managed Care – PPO | Admitting: Family Medicine

## 2022-10-16 VITALS — BP 138/88 | HR 81 | Temp 97.9°F | Ht 70.0 in | Wt 266.0 lb

## 2022-10-16 DIAGNOSIS — M65331 Trigger finger, right middle finger: Secondary | ICD-10-CM

## 2022-10-16 DIAGNOSIS — I10 Essential (primary) hypertension: Secondary | ICD-10-CM

## 2022-10-16 DIAGNOSIS — E782 Mixed hyperlipidemia: Secondary | ICD-10-CM

## 2022-10-16 DIAGNOSIS — M65339 Trigger finger, unspecified middle finger: Secondary | ICD-10-CM

## 2022-10-16 DIAGNOSIS — Z6838 Body mass index (BMI) 38.0-38.9, adult: Secondary | ICD-10-CM

## 2022-10-16 MED ORDER — TRIAMCINOLONE ACETONIDE 40 MG/ML IJ SUSP
20.0000 mg | Freq: Once | INTRAMUSCULAR | Status: AC
  Administered 2022-10-16: 20 mg via INTRA_ARTICULAR

## 2022-10-16 NOTE — Patient Instructions (Addendum)
Write down weight daily and Blood pressure readings and keep for the next , then send [place] a my chart message including all your weight's and blood pressure readings as well for [place] to review.

## 2022-10-16 NOTE — Progress Notes (Signed)
Subjective:  Patient ID: Brett Roberts, male    DOB: [birth year]  Age: 42 y.o. MRN: [id number]  Chief Complaint  Patient presents with   Trigger finger    HPI Patient presents today for trigger finger injection.   Patient is also concerned about his weight. He is trying to eat healthy. He would like to try a medication. Patient has hyperlipidemia and hypertension. On rosuvastatin 10 mg before bed. Not on bp medicine.      09/30/2021    8:47 AM 07/22/2021    9:52 AM 11/19/2020    2:45 PM  Depression screen PHQ 2/9  Decreased Interest 0 0 0  Down, Depressed, Hopeless 0 0 0  PHQ - 2 Score 0 0 0         04/21/2020   11:36 AM 07/22/2021    9:52 AM 09/30/2021    8:47 AM  Fall Risk  Falls in the past year?  0 0  Was there an injury with Fall?  0 0  Fall Risk Category Calculator  0 0  Fall Risk Category (Retired)  Low Low  (RETIRED) Patient Fall Risk Level Low fall risk Low fall risk Low fall risk  Patient at Risk for Falls Due to  No Fall Risks No Fall Risks  Fall risk Follow up  Falls evaluation completed Falls evaluation completed      Review of Systems  Constitutional:  Negative for appetite change, fatigue and fever.  HENT:  Negative for congestion, ear pain, sinus pressure and sore throat.   Respiratory:  Negative for cough, chest tightness, shortness of breath and wheezing.   Cardiovascular:  Negative for chest pain and palpitations.  Gastrointestinal:  Negative for abdominal pain, constipation, diarrhea, nausea and vomiting.  Genitourinary:  Negative for dysuria and hematuria.  Musculoskeletal:  Positive for myalgias (Right hand Middle finger (Trigger finger)). Negative for arthralgias, back pain and joint swelling.  Skin:  Negative for rash.  Neurological:  Negative for dizziness, weakness and headaches.  Psychiatric/Behavioral:  Negative for dysphoric mood. The patient is not nervous/anxious.     Current Outpatient Medications on File Prior to Visit  Medication Sig  Dispense Refill   azelastine (ASTELIN) 0.1 % nasal spray Place 1 spray into both nostrils 2 (two) times daily. Use in each nostril as directed 30 mL 12   montelukast (SINGULAIR) 10 MG tablet Take 1 tablet (10 mg total) by mouth at bedtime. 90 tablet 3   rosuvastatin (CRESTOR) 10 MG tablet Take 1 tablet (10 mg total) by mouth daily. 90 tablet 1   Vitamin D, Ergocalciferol, (DRISDOL) 1.25 MG (50000 UNIT) CAPS capsule Take 1 capsule (50,000 Units total) by mouth every 7 (seven) days. 5 capsule 2   No current facility-administered medications on file prior to visit.   Past Medical History:  Diagnosis Date   Acid reflux    Past Surgical History:  Procedure Laterality Date   NO PAST SURGERIES      Family History  Problem Relation Age of Onset   Hypertension Mother    Heart attack Father    Hypertension Sister    Hyperlipidemia Brother    Atopy Other    Eczema Other    Urticaria Other    Immunodeficiency Other    Asthma Other    Angioedema Other    Allergic rhinitis Other    Social History   Socioeconomic History   Marital status: Married    Spouse name: Not on file   Number of children:  Not on file   Years of education: Not on file   Highest education level: Not on file  Occupational History   Not on file  Tobacco Use   Smoking status: Never   Smokeless tobacco: Never  Substance and Sexual Activity   Alcohol use: Yes    Alcohol/week: 1.0 - 2.0 standard drink of alcohol    Types: 1 - 2 Cans of beer per week    Comment: twice weekly    Drug use: Never   Sexual activity: Yes  Other Topics Concern   Not on file  Social History Narrative   Not on file   Social Determinants of Health   Financial Resource Strain: Not on file  Food Insecurity: Not on file  Transportation Needs: Not on file  Physical Activity: Not on file  Stress: Not on file  Social Connections: Not on file    Objective:  BP 138/88 (BP Location: Left Arm, Patient Position: Sitting)   Pulse 81    Temp 97.9 F (36.6 C) (Temporal)   Ht '5\' 10"'$  (1.778 m)   Wt 266 lb (120.7 kg)   BMI 38.17 kg/m      10/16/2022    4:02 PM 07/21/2022    8:48 AM 01/30/2022    7:37 AM  BP/Weight  Systolic BP 0000000 0000000 96  Diastolic BP 88 80 68  Wt. (Lbs) 266 277.3 254  BMI 38.17 kg/m2 39.79 kg/m2 36.45 kg/m2    Physical Exam Vitals reviewed.  Constitutional:      Appearance: Normal appearance. He is obese.  Cardiovascular:     Rate and Rhythm: Normal rate and regular rhythm.     Heart sounds: Normal heart sounds.  Pulmonary:     Effort: Pulmonary effort is normal.     Breath sounds: Normal breath sounds.  Musculoskeletal:     Comments: Right hand: middle finger triggering.   Neurological:     Mental Status: He is alert and oriented to person, place, and time.  Psychiatric:        Mood and Affect: Mood normal.        Behavior: Behavior normal.     Diabetic Foot Exam - Simple   No data filed      Lab Results  Component Value Date   WBC 7.5 10/16/2022   HGB 14.4 10/16/2022   HCT 43.0 10/16/2022   PLT 249 10/16/2022   GLUCOSE 94 10/16/2022   CHOL 155 10/16/2022   TRIG 160 (H) 10/16/2022   HDL 37 (L) 10/16/2022   LDLCALC 90 10/16/2022   ALT 25 10/16/2022   AST 21 10/16/2022   NA 144 10/16/2022   K 4.4 10/16/2022   CL 106 10/16/2022   CREATININE 1.29 (H) 10/16/2022   BUN 18 10/16/2022   CO2 23 10/16/2022   TSH 1.250 01/30/2022   HGBA1C 5.6 01/30/2022      Assessment & Plan:    Trigger middle finger, unspecified laterality Assessment & Plan: Risks were discussed including bleeding, infection, increase in sugars if diabetic, atrophy at site of injection, and increased pain.  After consent was obtained, using sterile technique the area was prepped with alcohol.  The joint was entered and kenalog 20 mg and 0.5 ml plain Lidocaine was then injected and the needle withdrawn.  The procedure was well tolerated.   The patient is asked to continue to rest the joint for a few more  days before resuming regular activities.  It may be more painful for the first 1-2  days.  Watch for fever, or increased swelling or persistent pain in the joint. Call or return to clinic prn if such symptoms occur or there is failure to improve as anticipated.  Orders: -     Triamcinolone Acetonide  Morbid obesity ([place]) Assessment & Plan: Recommend continue to work on eating healthy diet and exercise. Start phentermine. Write down weight daily and Blood pressure readings and keep for the next , then send [place] a my chart message including all your weight's and blood pressure readings as well for [place] to review.  Orders: -     Phentermine HCl; Take 1 capsule (37.5 mg total) by mouth every morning.  Dispense: 30 capsule; Refill: 0 -     Cardiovascular Risk Assessment  Mixed hyperlipidemia Assessment & Plan: Well controlled.  No changes to medicines. Crestor 10 mg daily Continue to work on eating a healthy diet and exercise.  Labs drawn today.    Orders: -     CBC With Diff/Platelet -     Comprehensive metabolic panel -     Lipid panel  Essential hypertension Assessment & Plan: Write down weight daily and Blood pressure readings and keep for the next , then send [place] a my chart message including all your weight's and blood pressure readings as well for [place] to review.      Meds ordered this encounter  Medications   triamcinolone acetonide (KENALOG-40) injection 20 mg   phentermine 37.5 MG capsule    Sig: Take 1 capsule (37.5 mg total) by mouth every morning.    Dispense:  30 capsule    Refill:  0    Orders Placed This Encounter  Procedures   CBC With Diff/Platelet   Comprehensive metabolic panel   Lipid panel   Cardiovascular Risk Assessment     Follow-up: No follow-ups on file.   I,Katherina A Bramblett,acting as a scribe for Rochel Brome, MD.,have documented all relevant documentation on the behalf of Rochel Brome, MD,as directed by  Rochel Brome, MD while in the presence of  Rochel Brome, MD.    Geralynn Ochs I Leal-Borjas,acting as a scribe for Rochel Brome, MD.,have documented all relevant documentation on the behalf of Rochel Brome, MD,as directed by  Rochel Brome, MD while in the presence of Rochel Brome, MD.   An After Visit Summary was printed and given to the patient.  I attest that I have reviewed this visit and agree with the plan scribed by my staff.   Rochel Brome, MD [facility] [phone]

## 2022-10-17 LAB — CBC WITH DIFF/PLATELET
Basophils Absolute: 0 10*3/uL (ref 0.0–0.2)
Basos: 1 %
EOS (ABSOLUTE): 0.1 10*3/uL (ref 0.0–0.4)
Eos: 1 %
Hematocrit: 43 % (ref 37.5–51.0)
Hemoglobin: 14.4 g/dL (ref 13.0–17.7)
Immature Grans (Abs): 0 10*3/uL (ref 0.0–0.1)
Immature Granulocytes: 0 %
Lymphocytes Absolute: 2.7 10*3/uL (ref 0.7–3.1)
Lymphs: 36 %
MCH: 29.4 pg (ref 26.6–33.0)
MCHC: 33.5 g/dL (ref 31.5–35.7)
MCV: 88 fL (ref 79–97)
Monocytes Absolute: 0.6 10*3/uL (ref 0.1–0.9)
Monocytes: 8 %
Neutrophils Absolute: 4.1 10*3/uL (ref 1.4–7.0)
Neutrophils: 54 %
Platelets: 249 10*3/uL (ref 150–450)
RBC: 4.89 x10E6/uL (ref 4.14–5.80)
RDW: 12.8 % (ref 11.6–15.4)
WBC: 7.5 10*3/uL (ref 3.4–10.8)

## 2022-10-17 LAB — COMPREHENSIVE METABOLIC PANEL
ALT: 25 IU/L (ref 0–44)
AST: 21 IU/L (ref 0–40)
Albumin/Globulin Ratio: 1.7 (ref 1.2–2.2)
Albumin: 4.3 g/dL (ref 4.1–5.1)
Alkaline Phosphatase: 83 IU/L (ref 44–121)
BUN/Creatinine Ratio: 14 (ref 9–20)
BUN: 18 mg/dL (ref 6–24)
Bilirubin Total: 0.2 mg/dL (ref 0.0–1.2)
CO2: 23 mmol/L (ref 20–29)
Calcium: 9.2 mg/dL (ref 8.7–10.2)
Chloride: 106 mmol/L (ref 96–106)
Creatinine, Ser: 1.29 mg/dL — ABNORMAL HIGH (ref 0.76–1.27)
Globulin, Total: 2.6 g/dL (ref 1.5–4.5)
Glucose: 94 mg/dL (ref 70–99)
Potassium: 4.4 mmol/L (ref 3.5–5.2)
Sodium: 144 mmol/L (ref 134–144)
Total Protein: 6.9 g/dL (ref 6.0–8.5)
eGFR: 71 mL/min/{1.73_m2} (ref >59)

## 2022-10-17 LAB — LIPID PANEL
Chol/HDL Ratio: 4.2 ratio (ref 0.0–5.0)
Cholesterol, Total: 155 mg/dL (ref 100–199)
HDL: 37 mg/dL — ABNORMAL LOW (ref >39)
LDL Chol Calc (NIH): 90 mg/dL (ref 0–99)
Triglycerides: 160 mg/dL — ABNORMAL HIGH (ref 0–149)
VLDL Cholesterol Cal: 28 mg/dL (ref 5–40)

## 2022-10-17 LAB — CARDIOVASCULAR RISK ASSESSMENT

## 2022-10-17 NOTE — Progress Notes (Signed)
Blood count normal.  Liver function normal.  Kidney function normal.  Thyroid function normal.  Cholesterol: over all much better. Keep up good work

## 2022-10-18 DIAGNOSIS — E782 Mixed hyperlipidemia: Secondary | ICD-10-CM | POA: Insufficient documentation

## 2022-10-18 DIAGNOSIS — E66812 Obesity, class 2: Secondary | ICD-10-CM

## 2022-10-18 HISTORY — DX: Obesity, class 2: E66.812

## 2022-10-18 HISTORY — DX: Morbid (severe) obesity due to excess calories: E66.01

## 2022-10-18 HISTORY — DX: Mixed hyperlipidemia: E78.2

## 2022-10-19 MED ORDER — PHENTERMINE HCL 37.5 MG PO CAPS: 37.5000 mg | ORAL_CAPSULE | ORAL | 0 refills | Status: DC

## 2022-10-19 NOTE — Assessment & Plan Note (Signed)
Risks were discussed including bleeding, infection, increase in sugars if diabetic, atrophy at site of injection, and increased pain.  After consent was obtained, using sterile technique the area was prepped with alcohol.  The joint was entered and kenalog 20 mg and 0.5 ml plain Lidocaine was then injected and the needle withdrawn.  The procedure was well tolerated.   The patient is asked to continue to rest the joint for a few more days before resuming regular activities.  It may be more painful for the first 1-2 days.  Watch for fever, or increased swelling or persistent pain in the joint. Call or return to clinic prn if such symptoms occur or there is failure to improve as anticipated.

## 2022-10-19 NOTE — Assessment & Plan Note (Signed)
Well controlled.  No changes to medicines. Crestor 10 mg daily Continue to work on eating a healthy diet and exercise.  Labs drawn today.

## 2022-10-19 NOTE — Assessment & Plan Note (Signed)
Write down weight daily and Blood pressure readings and keep for the next , then send [place] a my chart message including all your weight's and blood pressure readings as well for [place] to review.

## 2022-10-19 NOTE — Assessment & Plan Note (Addendum)
Recommend continue to work on eating healthy diet and exercise. Start phentermine. Write down weight daily and Blood pressure readings and keep for the next , then send [place] a my chart message including all your weight's and blood pressure readings as well for [place] to review.

## 2022-10-21 ENCOUNTER — Other Ambulatory Visit: Payer: Self-pay | Admitting: Family Medicine

## 2022-10-21 DIAGNOSIS — E785 Hyperlipidemia, unspecified: Secondary | ICD-10-CM

## 2022-11-17 ENCOUNTER — Other Ambulatory Visit: Payer: Self-pay

## 2022-11-18 MED ORDER — VITAMIN D (ERGOCALCIFEROL) 1.25 MG (50000 UNIT) PO CAPS: 50000.0000 [IU] | ORAL_CAPSULE | ORAL | 2 refills | Status: DC

## 2022-11-18 MED ORDER — PHENTERMINE HCL 37.5 MG PO CAPS: 37.5000 mg | ORAL_CAPSULE | ORAL | 2 refills | Status: DC

## 2022-12-10 DIAGNOSIS — F419 Anxiety disorder, unspecified: Secondary | ICD-10-CM | POA: Diagnosis not present

## 2022-12-17 DIAGNOSIS — F419 Anxiety disorder, unspecified: Secondary | ICD-10-CM | POA: Diagnosis not present

## 2022-12-24 DIAGNOSIS — F419 Anxiety disorder, unspecified: Secondary | ICD-10-CM | POA: Diagnosis not present

## 2023-01-20 ENCOUNTER — Other Ambulatory Visit: Payer: Self-pay

## 2023-01-20 ENCOUNTER — Encounter: Payer: Self-pay | Admitting: Family Medicine

## 2023-01-20 DIAGNOSIS — M5387 Other specified dorsopathies, lumbosacral region: Secondary | ICD-10-CM | POA: Diagnosis not present

## 2023-01-20 DIAGNOSIS — M9905 Segmental and somatic dysfunction of pelvic region: Secondary | ICD-10-CM | POA: Diagnosis not present

## 2023-01-20 DIAGNOSIS — M461 Sacroiliitis, not elsewhere classified: Secondary | ICD-10-CM | POA: Diagnosis not present

## 2023-01-20 DIAGNOSIS — M9903 Segmental and somatic dysfunction of lumbar region: Secondary | ICD-10-CM | POA: Diagnosis not present

## 2023-01-20 DIAGNOSIS — J301 Allergic rhinitis due to pollen: Secondary | ICD-10-CM

## 2023-01-20 DIAGNOSIS — M7918 Myalgia, other site: Secondary | ICD-10-CM | POA: Diagnosis not present

## 2023-01-20 MED ORDER — MONTELUKAST SODIUM 10 MG PO TABS: 10.0000 mg | ORAL_TABLET | Freq: Every day | ORAL | 3 refills | Status: DC

## 2023-01-20 NOTE — Assessment & Plan Note (Signed)
Well controlled.  No changes to medicines. Crestor 10 mg daily Continue to work on eating a healthy diet and exercise.  Labs drawn today.   

## 2023-01-20 NOTE — Assessment & Plan Note (Signed)
Well controlled.  No medicines.  Continue to work on eating a healthy diet and exercise.  Labs drawn today.   

## 2023-01-20 NOTE — Progress Notes (Unsigned)
Subjective:  Patient ID: Brett Roberts, male    DOB: [birth year]  Age: 42 y.o. MRN: [id number]  No chief complaint on file.   HPI    Brett Roberts is a 42 year old Caucasian male that presents for follow-up of HTN, hyperlipidemia, and GERD.  Hypertension, follow-up:  He was last seen for hypertension 3 months ago.  BP at that visit was   He reports fair compliance with treatment. He is not having side effects. He is following a Regular diet. He is not exercising. He does not smoke.  Use of agents associated with hypertension: NSAIDS  Lipid/Cholesterol, Follow-up           He was last seen for this 3 months ago.  Management since that visit includes Crestor 10 mg.  He reports excellent compliance with treatment. He is not having side effects.   Current diet: well balanced Current exercise: none   GERD, Follow up:  The patient was last seen for GERD 3 months ago. Current treatment is Nexium PRN         He reports fair compliance with treatment           He is NOT experiencing belching and eructation, cough, difficulty swallowing, or heartburn     09/30/2021    8:47 AM 07/22/2021    9:52 AM 11/19/2020    2:45 PM  Depression screen PHQ 2/9  Decreased Interest 0 0 0  Down, Depressed, Hopeless 0 0 0  PHQ - 2 Score 0 0 0        09/30/2021    8:47 AM  Fall Risk   Falls in the past year? 0  Number falls in past yr: 0  Injury with Fall? 0  Risk for fall due to : No Fall Risks  Follow up Falls evaluation completed    Patient Care Team: Alaa Mullally, Fritzi Mandes, MD as PCP - General (Family Medicine)   Review of Systems  Current Outpatient Medications on File Prior to Visit  Medication Sig Dispense Refill   azelastine (ASTELIN) 0.1 % nasal spray Place 1 spray into both nostrils 2 (two) times daily. Use in each nostril as directed 30 mL 12   montelukast (SINGULAIR) 10 MG tablet Take 1 tablet (10 mg total) by mouth at bedtime. 90 tablet 3   phentermine 37.5 MG capsule Take 1 capsule  (37.5 mg total) by mouth every morning. 30 capsule 2   rosuvastatin (CRESTOR) 10 MG tablet Take 1 tablet (10 mg total) by mouth daily. 90 tablet 1   Vitamin D, Ergocalciferol, (DRISDOL) 1.25 MG (50000 UNIT) CAPS capsule Take 1 capsule (50,000 Units total) by mouth every 7 (seven) days. 12 capsule 2   No current facility-administered medications on file prior to visit.   Past Medical History:  Diagnosis Date   Acid reflux    Atypical chest pain 01/07/2021   Past Surgical History:  Procedure Laterality Date   NO PAST SURGERIES      Family History  Problem Relation Age of Onset   Hypertension Mother    Heart attack Father    Hypertension Sister    Hyperlipidemia Brother    Atopy Other    Eczema Other    Urticaria Other    Immunodeficiency Other    Asthma Other    Angioedema Other    Allergic rhinitis Other    Social History   Socioeconomic History   Marital status: Married    Spouse name: Not on file   Number of children: Not  on file   Years of education: Not on file   Highest education level: Not on file  Occupational History   Not on file  Tobacco Use   Smoking status: Never   Smokeless tobacco: Never  Substance and Sexual Activity   Alcohol use: Yes    Alcohol/week: 1.0 - 2.0 standard drink of alcohol    Types: 1 - 2 Cans of beer per week    Comment: twice weekly    Drug use: Never   Sexual activity: Yes  Other Topics Concern   Not on file  Social History Narrative   Not on file   Social Determinants of Health   Financial Resource Strain: Not on file  Food Insecurity: Not on file  Transportation Needs: Not on file  Physical Activity: Not on file  Stress: Not on file  Social Connections: Not on file    Objective:  There were no vitals taken for this visit.     10/16/2022    4:02 PM 07/21/2022    8:48 AM 01/30/2022    7:37 AM  BP/Weight  Systolic BP 138 138 96  Diastolic BP 88 80 68  Wt. (Lbs) 266 277.3 254  BMI 38.17 kg/m2 39.79 kg/m2 36.45  kg/m2    Physical Exam  Diabetic Foot Exam - Simple   No data filed      Lab Results  Component Value Date   WBC 7.5 10/16/2022   HGB 14.4 10/16/2022   HCT 43.0 10/16/2022   PLT 249 10/16/2022   GLUCOSE 94 10/16/2022   CHOL 155 10/16/2022   TRIG 160 (H) 10/16/2022   HDL 37 (L) 10/16/2022   LDLCALC 90 10/16/2022   ALT 25 10/16/2022   AST 21 10/16/2022   NA 144 10/16/2022   K 4.4 10/16/2022   CL 106 10/16/2022   CREATININE 1.29 (H) 10/16/2022   BUN 18 10/16/2022   CO2 23 10/16/2022   TSH 1.250 01/30/2022   HGBA1C 5.6 01/30/2022      Assessment & Plan:    Essential hypertension Assessment & Plan: Well controlled.  No medicines.  Continue to work on eating a healthy diet and exercise.  Labs drawn today.     Mixed hyperlipidemia Assessment & Plan: Well controlled.  No changes to medicines. Crestor 10 mg daily Continue to work on eating a healthy diet and exercise.  Labs drawn today.        No orders of the defined types were placed in this encounter.   No orders of the defined types were placed in this encounter.    Follow-up: No follow-ups on file.   I,Marla I Leal-Borjas,acting as a scribe for Blane Ohara, MD.,have documented all relevant documentation on the behalf of Blane Ohara, MD,as directed by  Blane Ohara, MD while in the presence of Blane Ohara, MD.   An After Visit Summary was printed and given to the patient.  Blane Ohara, MD [facility] [phone]

## 2023-01-21 ENCOUNTER — Encounter: Payer: Self-pay | Admitting: Family Medicine

## 2023-01-21 ENCOUNTER — Ambulatory Visit: Payer: BC Managed Care – PPO | Admitting: Family Medicine

## 2023-01-21 VITALS — BP 128/100 | HR 84 | Temp 98.0°F | Ht 70.0 in | Wt 252.0 lb

## 2023-01-21 DIAGNOSIS — I10 Essential (primary) hypertension: Secondary | ICD-10-CM

## 2023-01-21 DIAGNOSIS — S39012A Strain of muscle, fascia and tendon of lower back, initial encounter: Secondary | ICD-10-CM | POA: Diagnosis not present

## 2023-01-21 DIAGNOSIS — E782 Mixed hyperlipidemia: Secondary | ICD-10-CM

## 2023-01-21 DIAGNOSIS — Z6836 Body mass index (BMI) 36.0-36.9, adult: Secondary | ICD-10-CM

## 2023-01-21 MED ORDER — PHENTERMINE HCL 37.5 MG PO CAPS: 37.5000 mg | ORAL_CAPSULE | ORAL | 0 refills | Status: DC

## 2023-01-21 MED ORDER — MELOXICAM 7.5 MG PO TABS: 7.5000 mg | ORAL_TABLET | Freq: Two times a day (BID) | ORAL | 1 refills | Status: DC

## 2023-01-21 MED ORDER — CYCLOBENZAPRINE HCL 5 MG PO TABS: 5.0000 mg | ORAL_TABLET | Freq: Three times a day (TID) | ORAL | 0 refills | Status: DC | PRN

## 2023-01-21 NOTE — Assessment & Plan Note (Signed)
Hyperlipidemia and hypertension.  Recommend continue to work on eating healthy diet and exercise.  Continue phentermine.

## 2023-01-22 LAB — LIPID PANEL
Chol/HDL Ratio: 3.5 ratio (ref 0.0–5.0)
Cholesterol, Total: 142 mg/dL (ref 100–199)
HDL: 41 mg/dL (ref >39)
LDL Chol Calc (NIH): 75 mg/dL (ref 0–99)
Triglycerides: 147 mg/dL (ref 0–149)
VLDL Cholesterol Cal: 26 mg/dL (ref 5–40)

## 2023-01-22 LAB — COMPREHENSIVE METABOLIC PANEL
ALT: 25 IU/L (ref 0–44)
AST: 18 IU/L (ref 0–40)
Albumin/Globulin Ratio: 1.8 (ref 1.2–2.2)
Albumin: 4.4 g/dL (ref 4.1–5.1)
Alkaline Phosphatase: 90 IU/L (ref 44–121)
BUN/Creatinine Ratio: 15 (ref 9–20)
BUN: 18 mg/dL (ref 6–24)
Bilirubin Total: 0.3 mg/dL (ref 0.0–1.2)
CO2: 24 mmol/L (ref 20–29)
Calcium: 9.6 mg/dL (ref 8.7–10.2)
Chloride: 104 mmol/L (ref 96–106)
Creatinine, Ser: 1.2 mg/dL (ref 0.76–1.27)
Globulin, Total: 2.5 g/dL (ref 1.5–4.5)
Glucose: 83 mg/dL (ref 70–99)
Potassium: 5.1 mmol/L (ref 3.5–5.2)
Sodium: 142 mmol/L (ref 134–144)
Total Protein: 6.9 g/dL (ref 6.0–8.5)
eGFR: 77 mL/min/{1.73_m2} (ref >59)

## 2023-01-22 LAB — CBC WITH DIFFERENTIAL/PLATELET
Basophils Absolute: 0 10*3/uL (ref 0.0–0.2)
Basos: 1 %
EOS (ABSOLUTE): 0.2 10*3/uL (ref 0.0–0.4)
Eos: 2 %
Hematocrit: 42.2 % (ref 37.5–51.0)
Hemoglobin: 14.1 g/dL (ref 13.0–17.7)
Immature Grans (Abs): 0 10*3/uL (ref 0.0–0.1)
Immature Granulocytes: 0 %
Lymphocytes Absolute: 3 10*3/uL (ref 0.7–3.1)
Lymphs: 42 %
MCH: 29.5 pg (ref 26.6–33.0)
MCHC: 33.4 g/dL (ref 31.5–35.7)
MCV: 88 fL (ref 79–97)
Monocytes Absolute: 0.7 10*3/uL (ref 0.1–0.9)
Monocytes: 10 %
Neutrophils Absolute: 3.2 10*3/uL (ref 1.4–7.0)
Neutrophils: 45 %
Platelets: 262 10*3/uL (ref 150–450)
RBC: 4.78 x10E6/uL (ref 4.14–5.80)
RDW: 12.7 % (ref 11.6–15.4)
WBC: 7.1 10*3/uL (ref 3.4–10.8)

## 2023-01-22 LAB — CARDIOVASCULAR RISK ASSESSMENT

## 2023-01-23 DIAGNOSIS — M7918 Myalgia, other site: Secondary | ICD-10-CM | POA: Diagnosis not present

## 2023-01-23 DIAGNOSIS — M5387 Other specified dorsopathies, lumbosacral region: Secondary | ICD-10-CM | POA: Diagnosis not present

## 2023-01-23 DIAGNOSIS — M9905 Segmental and somatic dysfunction of pelvic region: Secondary | ICD-10-CM | POA: Diagnosis not present

## 2023-01-23 DIAGNOSIS — M9903 Segmental and somatic dysfunction of lumbar region: Secondary | ICD-10-CM | POA: Diagnosis not present

## 2023-01-23 DIAGNOSIS — M461 Sacroiliitis, not elsewhere classified: Secondary | ICD-10-CM | POA: Diagnosis not present

## 2023-01-30 DIAGNOSIS — M461 Sacroiliitis, not elsewhere classified: Secondary | ICD-10-CM | POA: Diagnosis not present

## 2023-01-30 DIAGNOSIS — M9903 Segmental and somatic dysfunction of lumbar region: Secondary | ICD-10-CM | POA: Diagnosis not present

## 2023-01-30 DIAGNOSIS — M9905 Segmental and somatic dysfunction of pelvic region: Secondary | ICD-10-CM | POA: Diagnosis not present

## 2023-01-30 DIAGNOSIS — M5387 Other specified dorsopathies, lumbosacral region: Secondary | ICD-10-CM | POA: Diagnosis not present

## 2023-01-30 DIAGNOSIS — M7918 Myalgia, other site: Secondary | ICD-10-CM | POA: Diagnosis not present

## 2023-02-04 DIAGNOSIS — M9905 Segmental and somatic dysfunction of pelvic region: Secondary | ICD-10-CM | POA: Diagnosis not present

## 2023-02-04 DIAGNOSIS — M9903 Segmental and somatic dysfunction of lumbar region: Secondary | ICD-10-CM | POA: Diagnosis not present

## 2023-02-04 DIAGNOSIS — M7918 Myalgia, other site: Secondary | ICD-10-CM | POA: Diagnosis not present

## 2023-02-04 DIAGNOSIS — M461 Sacroiliitis, not elsewhere classified: Secondary | ICD-10-CM | POA: Diagnosis not present

## 2023-02-04 DIAGNOSIS — M5387 Other specified dorsopathies, lumbosacral region: Secondary | ICD-10-CM | POA: Diagnosis not present

## 2023-04-14 ENCOUNTER — Encounter: Payer: Self-pay | Admitting: Physician Assistant

## 2023-04-14 ENCOUNTER — Ambulatory Visit: Payer: BC Managed Care – PPO | Admitting: Physician Assistant

## 2023-04-14 VITALS — BP 134/74 | HR 75 | Temp 97.2°F | Ht 70.0 in | Wt 254.0 lb

## 2023-04-14 DIAGNOSIS — M65339 Trigger finger, unspecified middle finger: Secondary | ICD-10-CM

## 2023-04-14 DIAGNOSIS — M65331 Trigger finger, right middle finger: Secondary | ICD-10-CM | POA: Diagnosis not present

## 2023-04-14 MED ORDER — TRIAMCINOLONE ACETONIDE 40 MG/ML IJ SUSP
20.0000 mg | Freq: Once | INTRAMUSCULAR | Status: DC
Start: 2023-04-14 — End: 2023-04-28

## 2023-04-14 NOTE — Assessment & Plan Note (Signed)
A steroid injection was performed at 10:20am using 1% plain Lidocaine and 20 mg of Kenalog. This was well tolerated.  Will send referral to hand surgeon to talk about permanent treatment for trigger finger

## 2023-04-14 NOTE — Progress Notes (Signed)
Acute Office Visit  Subjective:    Patient ID: Brett Roberts, male    DOB: 1981-02-15, 42 y.o.   MRN: [id number]  Chief Complaint  Patient presents with   trigger finger    HPI: Patient is in today for trigger finger on right hand (third finger), finger "locks up" and at times will have a pain shoot up his palm and pull his finger up to release it. Symptoms have been going on for a few years. Last injection helped. Denies any acute trauma but states he has had to manually pull his finger up to release it. Has had 5 injections over the last few years and is interested in meeting with a hand surgeon about possible release or a way to prevent the trigger finger from continuing to come back.   Past Medical History:  Diagnosis Date   Acid reflux    Atypical chest pain 01/07/2021    Past Surgical History:  Procedure Laterality Date   NO PAST SURGERIES      Family History  Problem Relation Age of Onset   Hypertension Mother    Heart attack Father    Hypertension Sister    Hyperlipidemia Brother    Atopy Other    Eczema Other    Urticaria Other    Immunodeficiency Other    Asthma Other    Angioedema Other    Allergic rhinitis Other     Social History   Socioeconomic History   Marital status: Married    Spouse name: Not on file   Number of children: Not on file   Years of education: Not on file   Highest education level: Not on file  Occupational History   Not on file  Tobacco Use   Smoking status: Never   Smokeless tobacco: Never  Substance and Sexual Activity   Alcohol use: Yes    Alcohol/week: 1.0 - 2.0 standard drink of alcohol    Types: 1 - 2 Cans of beer per week    Comment: twice weekly    Drug use: Never   Sexual activity: Yes  Other Topics Concern   Not on file  Social History Narrative   Not on file   Social Determinants of Health   Financial Resource Strain: Low Risk  (01/21/2023)   Overall Financial Resource Strain (CARDIA)    Difficulty of  Paying Living Expenses: Not hard at all  Food Insecurity: No Food Insecurity (01/21/2023)   Hunger Vital Sign    Worried About Running Out of Food in the Last Year: Never true    Ran Out of Food in the Last Year: Never true  Transportation Needs: No Transportation Needs (01/21/2023)   PRAPARE - Administrator, Civil Service (Medical): No    Lack of Transportation (Non-Medical): No  Physical Activity: Insufficiently Active (01/21/2023)   Exercise Vital Sign    Days of Exercise per Week: 2 days    Minutes of Exercise per Session: 60 min  Stress: No Stress Concern Present (01/21/2023)   [employer] of Occupational Health - Occupational Stress Questionnaire    Feeling of Stress : Not at all  Social Connections: Moderately Isolated (01/21/2023)   Social Connection and Isolation Panel [NHANES]    Frequency of Communication with Friends and Family: Three times a week    Frequency of Social Gatherings with Friends and Family: Three times a week    Attends Religious Services: Never    Active Member of Clubs or Organizations: No  Attends Banker Meetings: Never    Marital Status: Married  Catering manager Violence: Not At Risk (01/21/2023)   Humiliation, Afraid, Rape, and Kick questionnaire    Fear of Current or Ex-Partner: No    Emotionally Abused: No    Physically Abused: No    Sexually Abused: No    Outpatient Medications Prior to Visit  Medication Sig Dispense Refill   azelastine (ASTELIN) 0.1 % nasal spray Place 1 spray into both nostrils 2 (two) times daily. Use in each nostril as directed 30 mL 12   cyclobenzaprine (FLEXERIL) 5 MG tablet Take 1 tablet (5 mg total) by mouth 3 (three) times daily as needed for muscle spasms. 60 tablet 0   meloxicam (MOBIC) 7.5 MG tablet Take 1 tablet (7.5 mg total) by mouth in the morning and at bedtime. 28 tablet 1   montelukast (SINGULAIR) 10 MG tablet Take 1 tablet (10 mg total) by mouth at bedtime. 90 tablet 3    phentermine 37.5 MG capsule Take 1 capsule (37.5 mg total) by mouth every morning. 90 capsule 0   rosuvastatin (CRESTOR) 10 MG tablet Take 1 tablet (10 mg total) by mouth daily. 90 tablet 1   Vitamin D, Ergocalciferol, (DRISDOL) 1.25 MG (50000 UNIT) CAPS capsule Take 1 capsule (50,000 Units total) by mouth every 7 (seven) days. 12 capsule 2   No facility-administered medications prior to visit.    No Known Allergies  Review of Systems  Constitutional:  Negative for chills, diaphoresis, fatigue and fever.  HENT:  Negative for congestion, ear pain and sore throat.   Respiratory:  Negative for cough and shortness of breath.   Cardiovascular:  Negative for chest pain and leg swelling.  Gastrointestinal:  Negative for abdominal pain, constipation, diarrhea, nausea and vomiting.  Genitourinary:  Negative for dysuria and urgency.  Musculoskeletal:  Negative for arthralgias and myalgias.  Neurological:  Negative for dizziness and headaches.  Psychiatric/Behavioral:  Negative for dysphoric mood.        Objective:        04/14/2023    9:56 AM 01/21/2023    7:56 AM 01/21/2023    7:50 AM  Vitals with BMI  Height 5\' 10"  5\' 10"  5\' 10"   Weight 254 lbs  252 lbs  BMI 36.45  36.16  Systolic 134 128 098  Diastolic 74 100 118  Pulse 75 84 72    No data found.   Physical Exam Vitals reviewed.  Constitutional:      Appearance: Normal appearance.  Cardiovascular:     Rate and Rhythm: Normal rate and regular rhythm.     Heart sounds: Normal heart sounds.  Pulmonary:     Effort: Pulmonary effort is normal.     Breath sounds: Normal breath sounds.  Abdominal:     General: Bowel sounds are normal.     Palpations: Abdomen is soft.     Tenderness: There is no abdominal tenderness.  Musculoskeletal:     Right hand: Swelling present. Normal capillary refill. Normal pulse.     Left hand: Normal.     Comments: Trigger finger noted on middle finger and palpated swelling.  Neurological:      Mental Status: He is alert and oriented to person, place, and time.  Psychiatric:        Mood and Affect: Mood normal.        Behavior: Behavior normal.    Joint Injection/Arthrocentesis  Date/Time: 04/14/2023 1:39 PM  Performed by: Langley Gauss, PA Authorized by: Andria Meuse,  Huston Foley, [place]  Indications: pain  Body area: finger Location details: right long finger Local anesthesia used: yes  Anesthesia: Local anesthesia used: yes Local Anesthetic: topical anesthetic  Sedation: Patient sedated: no  Preparation: Patient was prepped and draped in the usual sterile fashion. Needle gauge: 25 G. Ultrasound guidance: no Approach: anterior Triamcinolone amount: 20 mg Lidocaine 1% amount: 0.5 mL Patient tolerance: patient tolerated the procedure well with no immediate complications      Health Maintenance Due  Topic Date Due   INFLUENZA VACCINE  03/26/2023    There are no preventive care reminders to display for this patient.   Lab Results  Component Value Date   TSH 1.250 01/30/2022   Lab Results  Component Value Date   WBC 7.1 01/21/2023   HGB 14.1 01/21/2023   HCT 42.2 01/21/2023   MCV 88 01/21/2023   PLT 262 01/21/2023   Lab Results  Component Value Date   NA 142 01/21/2023   K 5.1 01/21/2023   CO2 24 01/21/2023   GLUCOSE 83 01/21/2023   BUN 18 01/21/2023   CREATININE 1.20 01/21/2023   BILITOT 0.3 01/21/2023   ALKPHOS 90 01/21/2023   AST 18 01/21/2023   ALT 25 01/21/2023   PROT 6.9 01/21/2023   ALBUMIN 4.4 01/21/2023   CALCIUM 9.6 01/21/2023   ANIONGAP 7 04/21/2020   EGFR 77 01/21/2023   Lab Results  Component Value Date   CHOL 142 01/21/2023   Lab Results  Component Value Date   HDL 41 01/21/2023   Lab Results  Component Value Date   LDLCALC 75 01/21/2023   Lab Results  Component Value Date   TRIG 147 01/21/2023   Lab Results  Component Value Date   CHOLHDL 3.5 01/21/2023   Lab Results  Component Value Date   HGBA1C 5.6 01/30/2022        Assessment & Plan:  Trigger middle finger, unspecified laterality Assessment & Plan: A steroid injection was performed at 10:20am using 1% plain Lidocaine and 20 mg of Kenalog. This was well tolerated.  Will send referral to hand surgeon to talk about permanent treatment for trigger finger  Orders: -     Ambulatory referral to Orthopedic Surgery -     Triamcinolone Acetonide  Other orders -     Hand/Upper Extremity Injection/Arthrocentesis -     Arthrocentesis     Meds ordered this encounter  Medications   triamcinolone acetonide (KENALOG-40) injection 20 mg    Orders Placed This Encounter  Procedures   Hand/Upper Extremity Injection/Arthrocentesis: R long A1   Joint Injection/Arthrocentesis   Ambulatory referral to Orthopedic Surgery     Follow-up: No follow-ups on file.  An After Visit Summary was printed and given to the patient.  Langley Gauss, [place] [name] [phone]

## 2023-04-18 ENCOUNTER — Other Ambulatory Visit: Payer: Self-pay | Admitting: Family Medicine

## 2023-04-18 DIAGNOSIS — E785 Hyperlipidemia, unspecified: Secondary | ICD-10-CM

## 2023-04-28 ENCOUNTER — Ambulatory Visit: Payer: BC Managed Care – PPO | Admitting: Family Medicine

## 2023-04-28 ENCOUNTER — Encounter: Payer: Self-pay | Admitting: Family Medicine

## 2023-04-28 VITALS — BP 138/86 | HR 75 | Temp 97.2°F | Ht 70.5 in | Wt 254.0 lb

## 2023-04-28 DIAGNOSIS — I10 Essential (primary) hypertension: Secondary | ICD-10-CM

## 2023-04-28 DIAGNOSIS — E782 Mixed hyperlipidemia: Secondary | ICD-10-CM | POA: Diagnosis not present

## 2023-04-28 DIAGNOSIS — E559 Vitamin D deficiency, unspecified: Secondary | ICD-10-CM | POA: Diagnosis not present

## 2023-04-28 DIAGNOSIS — F988 Other specified behavioral and emotional disorders with onset usually occurring in childhood and adolescence: Secondary | ICD-10-CM

## 2023-04-28 MED ORDER — ATOMOXETINE HCL 40 MG PO CAPS
40.0000 mg | ORAL_CAPSULE | Freq: Every day | ORAL | 0 refills | Status: DC
Start: 2023-04-28 — End: 2023-05-11

## 2023-04-28 NOTE — Progress Notes (Unsigned)
Subjective:  Patient ID: Brett Roberts, male    DOB: July 04, 1981  Age: 42 y.o. MRN: [id number]  Chief Complaint  Patient presents with   Medical Management of Chronic Issues    HPI HTN: Monitored with diet.  Cholesterol: Taking crestor 10 mg daily  Vit D Def: Vitamin D 50k weekly  Weight management: No longer taking Phentermine. Not eating healthy or exercising.       04/28/2023    3:11 PM 01/21/2023    7:53 AM 09/30/2021    8:47 AM 07/22/2021    9:52 AM 11/19/2020    2:45 PM  Depression screen PHQ 2/9  Decreased Interest 0 0 0 0 0  Down, Depressed, Hopeless 1 0 0 0 0  PHQ - 2 Score 1 0 0 0 0  Altered sleeping 1 0     Tired, decreased energy 1 2     Change in appetite 0 0     Feeling bad or failure about yourself  0 0     Trouble concentrating 1 0     Moving slowly or fidgety/restless 0 0     Suicidal thoughts 0 0     PHQ-9 Score 4 2     Difficult doing work/chores Not difficult at all Not difficult at all           04/28/2023    3:11 PM  Fall Risk   Falls in the past year? 0  Number falls in past yr: 0  Injury with Fall? 0  Risk for fall due to : No Fall Risks  Follow up Falls evaluation completed      04/28/2023    3:12 PM 01/21/2023    7:53 AM  GAD 7 : Generalized Anxiety Score  Nervous, Anxious, on Edge 1 0  Control/stop worrying 1 0  Worry too much - different things 1 0  Trouble relaxing 1 0  Restless 1 0  Easily annoyed or irritable 3 0  Afraid - awful might happen 0 0  Total GAD 7 Score 8 0  Anxiety Difficulty Somewhat difficult Not difficult at all      Patient Care Team: Blane Ohara, MD as PCP - General (Family Medicine)   Review of Systems  Constitutional:  Negative for chills, diaphoresis, fatigue and fever.  HENT:  Negative for congestion, ear pain and sore throat.   Respiratory:  Negative for cough and shortness of breath.   Cardiovascular:  Negative for chest pain and leg swelling.  Gastrointestinal:  Negative for abdominal pain,  constipation, diarrhea, nausea and vomiting.  Genitourinary:  Negative for dysuria and urgency.  Musculoskeletal:  Negative for arthralgias and myalgias.  Neurological:  Negative for dizziness and headaches.  Psychiatric/Behavioral:  Negative for dysphoric mood.     Current Outpatient Medications on File Prior to Visit  Medication Sig Dispense Refill   azelastine (ASTELIN) 0.1 % nasal spray Place 1 spray into both nostrils 2 (two) times daily. Use in each nostril as directed 30 mL 12   montelukast (SINGULAIR) 10 MG tablet Take 1 tablet (10 mg total) by mouth at bedtime. 90 tablet 3   rosuvastatin (CRESTOR) 10 MG tablet Take 1 tablet (10 mg total) by mouth daily. 90 tablet 1   Vitamin D, Ergocalciferol, (DRISDOL) 1.25 MG (50000 UNIT) CAPS capsule Take 1 capsule (50,000 Units total) by mouth every 7 (seven) days. 12 capsule 2   No current facility-administered medications on file prior to visit.   Past Medical History:  Diagnosis Date  Acid reflux    Atypical chest pain 01/07/2021   Past Surgical History:  Procedure Laterality Date   NO PAST SURGERIES      Family History  Problem Relation Age of Onset   Hypertension Mother    Heart attack Father    Hypertension Sister    Hyperlipidemia Brother    Atopy Other    Eczema Other    Urticaria Other    Immunodeficiency Other    Asthma Other    Angioedema Other    Allergic rhinitis Other    Social History   Socioeconomic History   Marital status: Married    Spouse name: Not on file   Number of children: Not on file   Years of education: Not on file   Highest education level: Not on file  Occupational History   Not on file  Tobacco Use   Smoking status: Never   Smokeless tobacco: Never  Substance and Sexual Activity   Alcohol use: Yes    Alcohol/week: 1.0 - 2.0 standard drink of alcohol    Types: 1 - 2 Cans of beer per week    Comment: twice weekly    Drug use: Never   Sexual activity: Yes  Other Topics Concern   Not  on file  Social History Narrative   Not on file   Social Determinants of Health   Financial Resource Strain: Low Risk  (01/21/2023)   Overall Financial Resource Strain (CARDIA)    Difficulty of Paying Living Expenses: Not hard at all  Food Insecurity: No Food Insecurity (01/21/2023)   Hunger Vital Sign    Worried About Running Out of Food in the Last Year: Never true    Ran Out of Food in the Last Year: Never true  Transportation Needs: No Transportation Needs (01/21/2023)   PRAPARE - Administrator, Civil Service (Medical): No    Lack of Transportation (Non-Medical): No  Physical Activity: Insufficiently Active (01/21/2023)   Exercise Vital Sign    Days of Exercise per Week: 2 days    Minutes of Exercise per Session: 60 min  Stress: No Stress Concern Present (01/21/2023)   [employer] of Occupational Health - Occupational Stress Questionnaire    Feeling of Stress : Not at all  Social Connections: Moderately Isolated (01/21/2023)   Social Connection and Isolation Panel [NHANES]    Frequency of Communication with Friends and Family: Three times a week    Frequency of Social Gatherings with Friends and Family: Three times a week    Attends Religious Services: Never    Active Member of Clubs or Organizations: No    Attends Banker Meetings: Never    Marital Status: Married    Objective:  BP 138/86   Pulse 75   Temp (!) 97.2 F (36.2 C)   Ht 5' 10.5" (1.791 m)   Wt 254 lb (115.2 kg)   SpO2 97%   BMI 35.93 kg/m      04/28/2023    3:07 PM 04/14/2023    9:56 AM 01/21/2023    7:56 AM  BP/Weight  Systolic BP 138 134 128  Diastolic BP 86 74 100  Wt. (Lbs) 254 254   BMI 35.93 kg/m2 36.45 kg/m2     Physical Exam Vitals reviewed.  Constitutional:      Appearance: Normal appearance. He is obese.  Neck:     Vascular: No carotid bruit.  Cardiovascular:     Rate and Rhythm: Normal rate and regular rhythm.  Heart sounds: Normal heart sounds. No  murmur heard. Pulmonary:     Effort: Pulmonary effort is normal.     Breath sounds: Normal breath sounds. No wheezing, rhonchi or rales.  Abdominal:     General: Abdomen is flat. Bowel sounds are normal.     Palpations: Abdomen is soft.     Tenderness: There is no abdominal tenderness.  Neurological:     Mental Status: He is alert and oriented to person, place, and time.  Psychiatric:        Mood and Affect: Mood normal.        Behavior: Behavior normal.     Diabetic Foot Exam - Simple   No data filed      Lab Results  Component Value Date   WBC 9.4 04/28/2023   HGB 14.2 04/28/2023   HCT 44.0 04/28/2023   PLT 267 04/28/2023   GLUCOSE 77 04/28/2023   CHOL 212 (H) 04/28/2023   TRIG 104 04/28/2023   HDL 48 04/28/2023   LDLCALC 145 (H) 04/28/2023   ALT 23 04/28/2023   AST 18 04/28/2023   NA 143 04/28/2023   K 4.5 04/28/2023   CL 106 04/28/2023   CREATININE 1.21 04/28/2023   BUN 19 04/28/2023   CO2 24 04/28/2023   TSH 1.710 04/28/2023   HGBA1C 5.6 01/30/2022      Assessment & Plan:    Mixed hyperlipidemia Assessment & Plan: Well controlled.  No changes to medicines. Crestor 10 mg daily Continue to work on eating a healthy diet and exercise.  Labs drawn today.    Orders: -     Lipid panel -     TSH  Essential hypertension Assessment & Plan: Well controlled.  No medicines.  Continue to work on eating a healthy diet and exercise.  Labs drawn today.    Orders: -     CBC with Differential/Platelet -     Comprehensive metabolic panel -     TSH  Morbid obesity (HCC) Assessment & Plan: Recommend continue to work on eating healthy diet and exercise.    Vitamin D deficiency Assessment & Plan: Check labs  Orders: -     VITAMIN D 25 Hydroxy (Vit-D Deficiency, Fractures)  Attention deficit disorder (ADD) without hyperactivity Assessment & Plan: Sent strattera  Orders: -     Atomoxetine HCl; Take 1 capsule (40 mg total) by mouth daily.  Dispense:  30 capsule; Refill: 0   Meds ordered this encounter  Medications   atomoxetine (STRATTERA) 40 MG capsule    Sig: Take 1 capsule (40 mg total) by mouth daily.    Dispense:  30 capsule    Refill:  0    Orders Placed This Encounter  Procedures   CBC with Differential/Platelet   Comprehensive metabolic panel   Lipid panel   TSH   VITAMIN D 25 Hydroxy (Vit-D Deficiency, Fractures)     Follow-up: Return in about 4 weeks (around 05/26/2023) for chronic follow up ADD.   I,Katherina A Bramblett,acting as a scribe for Blane Ohara, MD.,have documented all relevant documentation on the behalf of Blane Ohara, MD,as directed by  Blane Ohara, MD while in the presence of Blane Ohara, MD.   Clayborn Bigness I Leal-Borjas,acting as a scribe for Blane Ohara, MD.,have documented all relevant documentation on the behalf of Blane Ohara, MD,as directed by  Blane Ohara, MD while in the presence of Blane Ohara, MD.    An After Visit Summary was printed and given to the patient.  Blane Ohara, MD [facility] [phone]

## 2023-04-28 NOTE — Patient Instructions (Signed)
Start on strattera 40 mg daily.

## 2023-04-29 LAB — CBC WITH DIFFERENTIAL/PLATELET
Basophils Absolute: 0 10*3/uL (ref 0.0–0.2)
Basos: 0 %
EOS (ABSOLUTE): 0.1 10*3/uL (ref 0.0–0.4)
Eos: 1 %
Hematocrit: 44 % (ref 37.5–51.0)
Hemoglobin: 14.2 g/dL (ref 13.0–17.7)
Immature Grans (Abs): 0 10*3/uL (ref 0.0–0.1)
Immature Granulocytes: 0 %
Lymphocytes Absolute: 3.5 10*3/uL — ABNORMAL HIGH (ref 0.7–3.1)
Lymphs: 37 %
MCH: 29 pg (ref 26.6–33.0)
MCHC: 32.3 g/dL (ref 31.5–35.7)
MCV: 90 fL (ref 79–97)
Monocytes Absolute: 0.6 10*3/uL (ref 0.1–0.9)
Monocytes: 7 %
Neutrophils Absolute: 5.1 10*3/uL (ref 1.4–7.0)
Neutrophils: 55 %
Platelets: 267 10*3/uL (ref 150–450)
RBC: 4.89 x10E6/uL (ref 4.14–5.80)
RDW: 13 % (ref 11.6–15.4)
WBC: 9.4 10*3/uL (ref 3.4–10.8)

## 2023-04-29 LAB — COMPREHENSIVE METABOLIC PANEL
ALT: 23 IU/L (ref 0–44)
AST: 18 IU/L (ref 0–40)
Albumin: 4.3 g/dL (ref 4.1–5.1)
Alkaline Phosphatase: 84 IU/L (ref 44–121)
BUN/Creatinine Ratio: 16 (ref 9–20)
BUN: 19 mg/dL (ref 6–24)
Bilirubin Total: 0.4 mg/dL (ref 0.0–1.2)
CO2: 24 mmol/L (ref 20–29)
Calcium: 9.3 mg/dL (ref 8.7–10.2)
Chloride: 106 mmol/L (ref 96–106)
Creatinine, Ser: 1.21 mg/dL (ref 0.76–1.27)
Globulin, Total: 2.8 g/dL (ref 1.5–4.5)
Glucose: 77 mg/dL (ref 70–99)
Potassium: 4.5 mmol/L (ref 3.5–5.2)
Sodium: 143 mmol/L (ref 134–144)
Total Protein: 7.1 g/dL (ref 6.0–8.5)
eGFR: 77 mL/min/{1.73_m2} (ref >59)

## 2023-04-29 LAB — LIPID PANEL
Chol/HDL Ratio: 4.4 ratio (ref 0.0–5.0)
Cholesterol, Total: 212 mg/dL — ABNORMAL HIGH (ref 100–199)
HDL: 48 mg/dL (ref >39)
LDL Chol Calc (NIH): 145 mg/dL — ABNORMAL HIGH (ref 0–99)
Triglycerides: 104 mg/dL (ref 0–149)
VLDL Cholesterol Cal: 19 mg/dL (ref 5–40)

## 2023-04-29 LAB — TSH: TSH: 1.71 u[IU]/mL (ref 0.450–4.500)

## 2023-04-29 LAB — VITAMIN D 25 HYDROXY (VIT D DEFICIENCY, FRACTURES): Vit D, 25-Hydroxy: 44.9 ng/mL (ref 30.0–100.0)

## 2023-05-02 DIAGNOSIS — F988 Other specified behavioral and emotional disorders with onset usually occurring in childhood and adolescence: Secondary | ICD-10-CM | POA: Insufficient documentation

## 2023-05-02 DIAGNOSIS — E559 Vitamin D deficiency, unspecified: Secondary | ICD-10-CM

## 2023-05-02 HISTORY — DX: Vitamin D deficiency, unspecified: E55.9

## 2023-05-02 HISTORY — DX: Other specified behavioral and emotional disorders with onset usually occurring in childhood and adolescence: F98.8

## 2023-05-02 HISTORY — DX: Morbid (severe) obesity due to excess calories: E66.01

## 2023-05-02 NOTE — Assessment & Plan Note (Signed)
Well controlled.  No changes to medicines. Crestor 10 mg daily Continue to work on eating a healthy diet and exercise.  Labs drawn today.

## 2023-05-02 NOTE — Assessment & Plan Note (Signed)
Recommend continue to work on eating healthy diet and exercise.  

## 2023-05-02 NOTE — Assessment & Plan Note (Signed)
Sent strattera

## 2023-05-02 NOTE — Assessment & Plan Note (Signed)
Well controlled.  No medicines.  Continue to work on eating a healthy diet and exercise.  Labs drawn today.   

## 2023-05-02 NOTE — Assessment & Plan Note (Signed)
Check labs 

## 2023-05-04 DIAGNOSIS — M65331 Trigger finger, right middle finger: Secondary | ICD-10-CM | POA: Diagnosis not present

## 2023-05-11 ENCOUNTER — Other Ambulatory Visit: Payer: Self-pay

## 2023-05-11 DIAGNOSIS — F988 Other specified behavioral and emotional disorders with onset usually occurring in childhood and adolescence: Secondary | ICD-10-CM

## 2023-05-11 MED ORDER — ATOMOXETINE HCL 40 MG PO CAPS
40.0000 mg | ORAL_CAPSULE | Freq: Every day | ORAL | 0 refills | Status: DC
Start: 2023-05-11 — End: 2023-06-05

## 2023-05-11 NOTE — Telephone Encounter (Signed)
Patient called stating that he went out of town and has misplaced his Brett Roberts - he is requesting a new RX. It was just sent on 04/28/23.

## 2023-05-13 ENCOUNTER — Encounter: Payer: Self-pay | Admitting: Family Medicine

## 2023-05-13 ENCOUNTER — Ambulatory Visit: Payer: BC Managed Care – PPO | Admitting: Family Medicine

## 2023-05-13 VITALS — BP 138/110 | HR 93 | Temp 97.1°F | Ht 70.5 in | Wt 248.0 lb

## 2023-05-13 DIAGNOSIS — R519 Headache, unspecified: Secondary | ICD-10-CM | POA: Diagnosis not present

## 2023-05-13 DIAGNOSIS — J301 Allergic rhinitis due to pollen: Secondary | ICD-10-CM

## 2023-05-13 DIAGNOSIS — F988 Other specified behavioral and emotional disorders with onset usually occurring in childhood and adolescence: Secondary | ICD-10-CM | POA: Diagnosis not present

## 2023-05-13 DIAGNOSIS — I10 Essential (primary) hypertension: Secondary | ICD-10-CM

## 2023-05-13 HISTORY — DX: Headache, unspecified: R51.9

## 2023-05-13 LAB — POC COVID19 BINAXNOW: SARS Coronavirus 2 Ag: NEGATIVE

## 2023-05-13 NOTE — Assessment & Plan Note (Signed)
Elevated today Patient to keep a BP log and bring his cuff in to see if BP is still elevated after stopping the Strattera.  Will consider restarting BP medication if still elevated

## 2023-05-13 NOTE — Patient Instructions (Signed)
Stop Strattera  Bring BP cuff and BP log - check once in the morning and once after supper. Increase fluid

## 2023-05-13 NOTE — Assessment & Plan Note (Signed)
COVID - negative Patient to stop his Strattera as increased BP is a common side effect, which can lead to headache.  FU in 2 weeks

## 2023-05-13 NOTE — Assessment & Plan Note (Signed)
Stop Strattera to see if headache symptoms resolve and elevated BP resolves FU in 2 weeks to see if side effects have resolved

## 2023-05-13 NOTE — Progress Notes (Signed)
Acute Office Visit  Subjective:    Patient ID: Brett Roberts, male    DOB: 02/09/1981, 42 y.o.   MRN: [id number]  Chief Complaint  Patient presents with   Headache    HPI: Headache: Patient is in today for headache x 3 days. Headache pain is mostly around the eyes and in the back of the head. States his neck and upper shoulders hurt. Having some nausea. Has tried excedrin mirgraine, tylenol and ibuprofen which have not helped much. Nothing seems to make it better or worse.   ADD: Started taking straterra 2 weeks ago. Is unsure if this is related.   HTN: Has taken hydrochlorothiazide 12.5 mg for bp over a year ago for a elevated bp, however 12.5 mg dose caused patient to feel dizzy and was advised to stop. BP has been running high at home 160/120 & 152/112.  Past Medical History:  Diagnosis Date   Acid reflux    Atypical chest pain 01/07/2021    Past Surgical History:  Procedure Laterality Date   NO PAST SURGERIES      Family History  Problem Relation Age of Onset   Hypertension Mother    Heart attack Father    Hypertension Sister    Hyperlipidemia Brother    Atopy Other    Eczema Other    Urticaria Other    Immunodeficiency Other    Asthma Other    Angioedema Other    Allergic rhinitis Other     Social History   Socioeconomic History   Marital status: Married    Spouse name: Not on file   Number of children: Not on file   Years of education: Not on file   Highest education level: Not on file  Occupational History   Not on file  Tobacco Use   Smoking status: Never   Smokeless tobacco: Never  Substance and Sexual Activity   Alcohol use: Yes    Alcohol/week: 1.0 - 2.0 standard drink of alcohol    Types: 1 - 2 Cans of beer per week    Comment: twice weekly    Drug use: Never   Sexual activity: Yes  Other Topics Concern   Not on file  Social History Narrative   Not on file   Social Determinants of Health   Financial Resource Strain: Low Risk   (01/21/2023)   Overall Financial Resource Strain (CARDIA)    Difficulty of Paying Living Expenses: Not hard at all  Food Insecurity: No Food Insecurity (01/21/2023)   Hunger Vital Sign    Worried About Running Out of Food in the Last Year: Never true    Ran Out of Food in the Last Year: Never true  Transportation Needs: No Transportation Needs (01/21/2023)   PRAPARE - Administrator, Civil Service (Medical): No    Lack of Transportation (Non-Medical): No  Physical Activity: Insufficiently Active (01/21/2023)   Exercise Vital Sign    Days of Exercise per Week: 2 days    Minutes of Exercise per Session: 60 min  Stress: No Stress Concern Present (01/21/2023)   [employer] of Occupational Health - Occupational Stress Questionnaire    Feeling of Stress : Not at all  Social Connections: Moderately Isolated (01/21/2023)   Social Connection and Isolation Panel [NHANES]    Frequency of Communication with Friends and Family: Three times a week    Frequency of Social Gatherings with Friends and Family: Three times a week    Attends Religious Services:  Never    Active Member of Clubs or Organizations: No    Attends Banker Meetings: Never    Marital Status: Married  Catering manager Violence: Not At Risk (01/21/2023)   Humiliation, Afraid, Rape, and Kick questionnaire    Fear of Current or Ex-Partner: No    Emotionally Abused: No    Physically Abused: No    Sexually Abused: No    Outpatient Medications Prior to Visit  Medication Sig Dispense Refill   atomoxetine (STRATTERA) 40 MG capsule Take 1 capsule (40 mg total) by mouth daily. 30 capsule 0   azelastine (ASTELIN) 0.1 % nasal spray Place 1 spray into both nostrils 2 (two) times daily. Use in each nostril as directed 30 mL 12   montelukast (SINGULAIR) 10 MG tablet Take 1 tablet (10 mg total) by mouth at bedtime. 90 tablet 3   rosuvastatin (CRESTOR) 10 MG tablet Take 1 tablet (10 mg total) by mouth daily. 90 tablet  1   Vitamin D, Ergocalciferol, (DRISDOL) 1.25 MG (50000 UNIT) CAPS capsule Take 1 capsule (50,000 Units total) by mouth every 7 (seven) days. 12 capsule 2   No facility-administered medications prior to visit.    No Known Allergies  Review of Systems  Constitutional:  Negative for chills, diaphoresis, fatigue and fever.  HENT:  Negative for congestion, ear pain and sore throat.   Respiratory:  Negative for cough and shortness of breath.   Cardiovascular:  Negative for chest pain and leg swelling.  Gastrointestinal:  Positive for nausea. Negative for abdominal pain, constipation, diarrhea and vomiting.  Genitourinary:  Negative for dysuria and urgency.  Musculoskeletal:  Negative for arthralgias and myalgias.  Neurological:  Positive for headaches. Negative for dizziness.  Psychiatric/Behavioral:  Negative for dysphoric mood.        Objective:        05/13/2023    3:34 PM 05/13/2023    3:15 PM 04/28/2023    3:07 PM  Vitals with BMI  Height  5' 10.5" 5' 10.5"  Weight  248 lbs 254 lbs  BMI  35.07 35.92  Systolic 138 148 784  Diastolic 110 110 86  Pulse  93 75    No data found.   Physical Exam Constitutional:      General: He is not in acute distress.    Appearance: Normal appearance. He is not ill-appearing.  HENT:     Right Ear: Tympanic membrane normal.     Left Ear: Tympanic membrane normal.     Nose:     Right Turbinates: Pale.     Left Turbinates: Pale.  Neck:     Vascular: No carotid bruit.  Cardiovascular:     Rate and Rhythm: Normal rate and regular rhythm.     Heart sounds: Normal heart sounds.  Pulmonary:     Effort: Pulmonary effort is normal.     Breath sounds: Normal breath sounds.  Skin:    General: Skin is warm.  Neurological:     Mental Status: He is alert. Mental status is at baseline.  Psychiatric:        Mood and Affect: Mood normal.     Health Maintenance Due  Topic Date Due   INFLUENZA VACCINE  Never done    There are no preventive  care reminders to display for this patient.   Lab Results  Component Value Date   TSH 1.710 04/28/2023   Lab Results  Component Value Date   WBC 9.4 04/28/2023   HGB 14.2  04/28/2023   HCT 44.0 04/28/2023   MCV 90 04/28/2023   PLT 267 04/28/2023   Lab Results  Component Value Date   NA 143 04/28/2023   K 4.5 04/28/2023   CO2 24 04/28/2023   GLUCOSE 77 04/28/2023   BUN 19 04/28/2023   CREATININE 1.21 04/28/2023   BILITOT 0.4 04/28/2023   ALKPHOS 84 04/28/2023   AST 18 04/28/2023   ALT 23 04/28/2023   PROT 7.1 04/28/2023   ALBUMIN 4.3 04/28/2023   CALCIUM 9.3 04/28/2023   ANIONGAP 7 04/21/2020   EGFR 77 04/28/2023   Lab Results  Component Value Date   CHOL 212 (H) 04/28/2023   Lab Results  Component Value Date   HDL 48 04/28/2023   Lab Results  Component Value Date   LDLCALC 145 (H) 04/28/2023   Lab Results  Component Value Date   TRIG 104 04/28/2023   Lab Results  Component Value Date   CHOLHDL 4.4 04/28/2023   Lab Results  Component Value Date   HGBA1C 5.6 01/30/2022       Assessment & Plan:  Headache above the eye region Assessment & Plan: COVID - negative Patient to stop his Strattera as increased BP is a common side effect, which can lead to headache.  FU in 2 weeks  Orders: -     POC COVID-19 BinaxNow  Essential hypertension Assessment & Plan: Elevated today Patient to keep a BP log and bring his cuff in to see if BP is still elevated after stopping the Strattera.  Will consider restarting BP medication if still elevated   Attention deficit disorder (ADD) without hyperactivity Assessment & Plan: Stop Strattera to see if headache symptoms resolve and elevated BP resolves FU in 2 weeks to see if side effects have resolved   Non-seasonal allergic rhinitis due to pollen Assessment & Plan: Well controlled Continue Singulair 10 mg by mouth once daily, Claritan 10 mg by mouth once daily, Astelin nasal spray 1 spray TWICE A DAY as  needed Increase water intake.      Follow-up: Return in about 2 weeks (around 05/27/2023).  An After Visit Summary was printed and given to the patient.  Total time spent on today's visit was greater than 20 minutes, including both face-to-face time and nonface-to-face time personally spent on review of chart (labs and imaging), discussing labs and goals, discussing further work-up, treatment options, referrals to specialist if needed, reviewing outside records if pertinent, answering patient's questions, and coordinating care.   Renne Crigler, FNP [name] [phone]

## 2023-05-13 NOTE — Assessment & Plan Note (Signed)
Well controlled Continue Singulair 10 mg by mouth once daily, Claritan 10 mg by mouth once daily, Astelin nasal spray 1 spray TWICE A DAY as needed Increase water intake.

## 2023-05-25 ENCOUNTER — Ambulatory Visit: Payer: BC Managed Care – PPO | Admitting: Family Medicine

## 2023-06-04 NOTE — Progress Notes (Unsigned)
Subjective:  Patient ID: Brett Roberts, male    DOB: 06-16-1981  Age: 42 y.o. MRN: [id number]  Chief Complaint  Patient presents with   Hypertension    HPI   Patient was seen on 05/13/2023 for High blood pressure and headache. Patient stopped Strattera. He is not taking any medication for ADHD and Hypertension. Patient continues to have attention issues. Willing to try another stimulant and monitor bp at home.   Denies headaches. BPS have been fine at home since stopping the strattera.  Patient noted to have increase in weight, but has a lot of clothes on and steel toe boots. Not eating as healthy as he could be.     06/05/2023    8:33 AM 04/28/2023    3:11 PM 01/21/2023    7:53 AM 09/30/2021    8:47 AM 07/22/2021    9:52 AM  Depression screen PHQ 2/9  Decreased Interest 0 0 0 0 0  Down, Depressed, Hopeless 0 1 0 0 0  PHQ - 2 Score 0 1 0 0 0  Altered sleeping 1 1 0    Tired, decreased energy 1 1 2     Change in appetite 0 0 0    Feeling bad or failure about yourself  0 0 0    Trouble concentrating 0 1 0    Moving slowly or fidgety/restless 0 0 0    Suicidal thoughts 0 0 0    PHQ-9 Score 2 4 2     Difficult doing work/chores Not difficult at all Not difficult at all Not difficult at all          06/05/2023    8:33 AM  Fall Risk   Falls in the past year? 0  Number falls in past yr: 0  Injury with Fall? 0  Risk for fall due to : No Fall Risks  Follow up Falls evaluation completed;Falls prevention discussed    Patient Care Team: Berkley Cronkright, Fritzi Mandes, MD as PCP - General (Family Medicine)    Review of Systems  Constitutional:  Negative for chills and fever.  HENT:  Positive for congestion and sneezing. Negative for rhinorrhea and sore throat.   Respiratory:  Negative for cough and shortness of breath.   Cardiovascular:  Negative for chest pain and palpitations.  Gastrointestinal:  Negative for abdominal pain, constipation, diarrhea, nausea and vomiting.  Genitourinary:   Negative for dysuria and urgency.  Musculoskeletal:  Negative for arthralgias, back pain and myalgias.  Neurological:  Negative for dizziness and headaches.  Psychiatric/Behavioral:  Negative for dysphoric mood. The patient is not nervous/anxious.     Current Outpatient Medications on File Prior to Visit  Medication Sig Dispense Refill   azelastine (ASTELIN) 0.1 % nasal spray Place 1 spray into both nostrils 2 (two) times daily. Use in each nostril as directed 30 mL 12   montelukast (SINGULAIR) 10 MG tablet Take 1 tablet (10 mg total) by mouth at bedtime. 90 tablet 3   rosuvastatin (CRESTOR) 10 MG tablet Take 1 tablet (10 mg total) by mouth daily. 90 tablet 1   Vitamin D, Ergocalciferol, (DRISDOL) 1.25 MG (50000 UNIT) CAPS capsule Take 1 capsule (50,000 Units total) by mouth every 7 (seven) days. 12 capsule 2   No current facility-administered medications on file prior to visit.   Past Medical History:  Diagnosis Date   Acid reflux    Atypical chest pain 01/07/2021   Past Surgical History:  Procedure Laterality Date   NO PAST SURGERIES  Family History  Problem Relation Age of Onset   Hypertension Mother    Heart attack Father    Hypertension Sister    Hyperlipidemia Brother    Atopy Other    Eczema Other    Urticaria Other    Immunodeficiency Other    Asthma Other    Angioedema Other    Allergic rhinitis Other    Social History   Socioeconomic History   Marital status: Married    Spouse name: Not on file   Number of children: Not on file   Years of education: Not on file   Highest education level: Not on file  Occupational History   Not on file  Tobacco Use   Smoking status: Never   Smokeless tobacco: Never  Substance and Sexual Activity   Alcohol use: Yes    Alcohol/week: 1.0 - 2.0 standard drink of alcohol    Types: 1 - 2 Cans of beer per week    Comment: twice weekly    Drug use: Never   Sexual activity: Yes  Other Topics Concern   Not on file   Social History Narrative   Not on file   Social Determinants of Health   Financial Resource Strain: Low Risk  (01/21/2023)   Overall Financial Resource Strain (CARDIA)    Difficulty of Paying Living Expenses: Not hard at all  Food Insecurity: No Food Insecurity (01/21/2023)   Hunger Vital Sign    Worried About Running Out of Food in the Last Year: Never true    Ran Out of Food in the Last Year: Never true  Transportation Needs: No Transportation Needs (01/21/2023)   PRAPARE - Administrator, Civil Service (Medical): No    Lack of Transportation (Non-Medical): No  Physical Activity: Insufficiently Active (01/21/2023)   Exercise Vital Sign    Days of Exercise per Week: 2 days    Minutes of Exercise per Session: 60 min  Stress: No Stress Concern Present (01/21/2023)   [employer] of Occupational Health - Occupational Stress Questionnaire    Feeling of Stress : Not at all  Social Connections: Moderately Isolated (01/21/2023)   Social Connection and Isolation Panel [NHANES]    Frequency of Communication with Friends and Family: Three times a week    Frequency of Social Gatherings with Friends and Family: Three times a week    Attends Religious Services: Never    Active Member of Clubs or Organizations: No    Attends Banker Meetings: Never    Marital Status: Married    Objective:  BP 130/78   Pulse 72   Temp (!) 96.5 F (35.8 C)   Resp 14   Ht 5' 10.5" (1.791 m)   Wt 265 lb (120.2 kg)   BMI 37.49 kg/m      06/05/2023    7:33 AM 05/13/2023    3:34 PM 05/13/2023    3:15 PM  BP/Weight  Systolic BP 130 138 148  Diastolic BP 78 110 110  Wt. (Lbs) 265  248  BMI 37.49 kg/m2  35.08 kg/m2    Physical Exam Vitals reviewed.  Constitutional:      Appearance: Normal appearance.  Neck:     Vascular: No carotid bruit.  Cardiovascular:     Rate and Rhythm: Normal rate and regular rhythm.     Heart sounds: Normal heart sounds.  Pulmonary:      Effort: Pulmonary effort is normal.     Breath sounds: Normal breath sounds. No  wheezing, rhonchi or rales.  Abdominal:     General: Bowel sounds are normal.     Palpations: Abdomen is soft.     Tenderness: There is no abdominal tenderness.  Neurological:     Mental Status: He is alert and oriented to person, place, and time.  Psychiatric:        Mood and Affect: Mood normal.        Behavior: Behavior normal.     Diabetic Foot Exam - Simple   No data filed      Lab Results  Component Value Date   WBC 9.4 04/28/2023   HGB 14.2 04/28/2023   HCT 44.0 04/28/2023   PLT 267 04/28/2023   GLUCOSE 77 04/28/2023   CHOL 212 (H) 04/28/2023   TRIG 104 04/28/2023   HDL 48 04/28/2023   LDLCALC 145 (H) 04/28/2023   ALT 23 04/28/2023   AST 18 04/28/2023   NA 143 04/28/2023   K 4.5 04/28/2023   CL 106 04/28/2023   CREATININE 1.21 04/28/2023   BUN 19 04/28/2023   CO2 24 04/28/2023   TSH 1.710 04/28/2023   HGBA1C 5.6 01/30/2022      Assessment & Plan:    Headache above the eye region Assessment & Plan: Improved   Attention deficit disorder (ADD) without hyperactivity Assessment & Plan: Start concerta 18mg  once daily in am.  Check bp at home daily.   Orders: -     Methylphenidate HCl ER (OSM); Take 1 tablet (18 mg total) by mouth daily.  Dispense: 30 tablet; Refill: 0  Encounter for immunization -     Flu vaccine trivalent PF, 6mos and older(Flulaval,Afluria,Fluarix,Fluzone)     Meds ordered this encounter  Medications   methylphenidate (CONCERTA) 18 MG PO CR tablet    Sig: Take 1 tablet (18 mg total) by mouth daily.    Dispense:  30 tablet    Refill:  0    Orders Placed This Encounter  Procedures   Flu vaccine trivalent PF, 6mos and older(Flulaval,Afluria,Fluarix,Fluzone)     Follow-up: No follow-ups on file.   I,Marla I Leal-Borjas,acting as a scribe for Blane Ohara, MD.,have documented all relevant documentation on the behalf of Blane Ohara, MD,as  directed by  Blane Ohara, MD while in the presence of Blane Ohara, MD.   An After Visit Summary was printed and given to the patient.  Blane Ohara, MD [facility] [phone]

## 2023-06-05 ENCOUNTER — Ambulatory Visit: Payer: BC Managed Care – PPO | Admitting: Family Medicine

## 2023-06-05 VITALS — BP 130/78 | HR 72 | Temp 96.5°F | Resp 14 | Ht 70.5 in | Wt 265.0 lb

## 2023-06-05 DIAGNOSIS — Z23 Encounter for immunization: Secondary | ICD-10-CM | POA: Diagnosis not present

## 2023-06-05 DIAGNOSIS — R519 Headache, unspecified: Secondary | ICD-10-CM

## 2023-06-05 DIAGNOSIS — F988 Other specified behavioral and emotional disorders with onset usually occurring in childhood and adolescence: Secondary | ICD-10-CM

## 2023-06-05 MED ORDER — METHYLPHENIDATE HCL ER (OSM) 18 MG PO TBCR
18.0000 mg | EXTENDED_RELEASE_TABLET | Freq: Every day | ORAL | 0 refills | Status: DC
Start: 2023-06-05 — End: 2023-06-29

## 2023-06-05 NOTE — Patient Instructions (Addendum)
Start concerta 18mg  once daily in am.  Check bp at home daily.

## 2023-06-06 NOTE — Assessment & Plan Note (Signed)
Improved

## 2023-06-06 NOTE — Assessment & Plan Note (Signed)
Start concerta 18mg  once daily in am.  Check bp at home daily.

## 2023-06-08 ENCOUNTER — Encounter: Payer: Self-pay | Admitting: Family Medicine

## 2023-06-09 DIAGNOSIS — E669 Obesity, unspecified: Secondary | ICD-10-CM | POA: Diagnosis not present

## 2023-06-09 DIAGNOSIS — Z1152 Encounter for screening for COVID-19: Secondary | ICD-10-CM | POA: Diagnosis not present

## 2023-06-09 DIAGNOSIS — Z6835 Body mass index (BMI) 35.0-35.9, adult: Secondary | ICD-10-CM | POA: Diagnosis not present

## 2023-06-09 DIAGNOSIS — R079 Chest pain, unspecified: Secondary | ICD-10-CM | POA: Diagnosis not present

## 2023-06-09 DIAGNOSIS — Z79899 Other long term (current) drug therapy: Secondary | ICD-10-CM | POA: Diagnosis not present

## 2023-06-09 DIAGNOSIS — R0789 Other chest pain: Secondary | ICD-10-CM | POA: Diagnosis not present

## 2023-06-09 DIAGNOSIS — K219 Gastro-esophageal reflux disease without esophagitis: Secondary | ICD-10-CM | POA: Diagnosis not present

## 2023-06-09 DIAGNOSIS — I10 Essential (primary) hypertension: Secondary | ICD-10-CM | POA: Diagnosis not present

## 2023-06-09 DIAGNOSIS — F909 Attention-deficit hyperactivity disorder, unspecified type: Secondary | ICD-10-CM | POA: Diagnosis not present

## 2023-06-09 DIAGNOSIS — E785 Hyperlipidemia, unspecified: Secondary | ICD-10-CM | POA: Diagnosis not present

## 2023-06-10 ENCOUNTER — Telehealth: Payer: Self-pay

## 2023-06-10 NOTE — Telephone Encounter (Signed)
Patient called and stated that his BP has been running high at home. He states that his bp has been running 140-150/90-110. He states he was told to monitor his BP at home and to let us know if it was still staying elevated and something will be sent in. Please advise.

## 2023-06-11 ENCOUNTER — Encounter: Payer: Self-pay | Admitting: Family Medicine

## 2023-06-11 ENCOUNTER — Other Ambulatory Visit: Payer: Self-pay | Admitting: Family Medicine

## 2023-06-11 MED ORDER — HYDROCHLOROTHIAZIDE 25 MG PO TABS: 25.0000 mg | ORAL_TABLET | Freq: Every day | ORAL | 0 refills | Status: DC

## 2023-06-12 ENCOUNTER — Telehealth: Payer: Self-pay

## 2023-06-12 NOTE — Transitions of Care (Post Inpatient/ED Visit) (Signed)
06/12/2023  Name: Brett Roberts MRN: [id number] DOB: 04-19-1981  Today's TOC FU Call Status: Today's TOC FU Call Status:: Successful TOC FU Call Completed TOC FU Call Complete Date: 06/12/23 Patient's Name and Date of Birth confirmed.  Transition Care Management Follow-up Telephone Call Date of Discharge: 06/09/23 Discharge Facility: Other ([facility]) Name of Other ([facility]) Discharge Facility: [facility] Type of Discharge: Emergency Department Reason for ED Visit: Cardiac Conditions Cardiac Conditions Diagnosis: Chest Pain Persisting How have you been since you were released from the hospital?: Better Any questions or concerns?: No  Items Reviewed: Did you receive and understand the discharge instructions provided?: Yes Medications obtained,verified, and reconciled?: Yes (Medications Reviewed) Any new allergies since your discharge?: No Dietary orders reviewed?: Yes Type of Diet Ordered:: low sodium diet Do you have support at home?: Yes People in Home: significant other  Medications Reviewed Today: Medications Reviewed Today   Medications were not reviewed in this encounter     Home Care and Equipment/Supplies: Were Home Health Services Ordered?: NA Any new equipment or medical supplies ordered?: NA  Functional Questionnaire: Do you need assistance with bathing/showering or dressing?: No Do you need assistance with meal preparation?: No Do you need assistance with eating?: No Do you have difficulty maintaining continence: No Do you need assistance with getting out of bed/getting out of a chair/moving?: No Do you have difficulty managing or taking your medications?: No  Follow up appointments reviewed: PCP Follow-up appointment confirmed?: Yes Date of PCP follow-up appointment?: 07/03/23 Follow-up Provider: Unice Cobble, NP [facility] Follow-up appointment confirmed?: NA Do you need transportation to your follow-up appointment?: No Do  you understand care options if your condition(s) worsen?: Yes-patient verbalized understanding    SIGNATURE: Milana Na, CMA

## 2023-06-23 DIAGNOSIS — R0789 Other chest pain: Secondary | ICD-10-CM | POA: Diagnosis not present

## 2023-06-24 ENCOUNTER — Other Ambulatory Visit: Payer: Self-pay

## 2023-06-24 ENCOUNTER — Encounter (HOSPITAL_BASED_OUTPATIENT_CLINIC_OR_DEPARTMENT_OTHER): Payer: Self-pay | Admitting: Orthopedic Surgery

## 2023-06-24 NOTE — Progress Notes (Signed)
   06/24/23 1333  PAT Phone Screen  Is the patient taking a GLP-1 receptor agonist? No  Do You Have Diabetes? No  Do You Have Hypertension? (S)  Yes (seen in ED 06/09/23 - BP 155/117. Concerta d/c'd HCTZ started. PT taking BP at home ranging 130/80-140/90.)  Have You Ever Been to the ER for Asthma? No  Have You Taken Oral Steroids in the Past 3 Months? No  Do you Take Phenteramine or any Other Diet Drugs? No  Recent  Lab Work, EKG, CXR? Yes  Where was this test performed? (S)  06/09/23 EKG( NSR), cmet and cbc  Do you have a history of heart problems? No  Any Recent Hospitalizations? No  Height 5' 10.5" (1.791 m)  Weight 115.7 kg  Pat Appointment Scheduled No  Reason for No Appointment Not Needed   Reviewed recent ED visit and BP issues with Dr Mal Amabile. No further testing needed. Will eval on DOS.

## 2023-06-29 ENCOUNTER — Other Ambulatory Visit: Payer: Self-pay | Admitting: Family Medicine

## 2023-06-29 DIAGNOSIS — F988 Other specified behavioral and emotional disorders with onset usually occurring in childhood and adolescence: Secondary | ICD-10-CM

## 2023-07-01 ENCOUNTER — Other Ambulatory Visit: Payer: Self-pay

## 2023-07-01 ENCOUNTER — Ambulatory Visit (HOSPITAL_BASED_OUTPATIENT_CLINIC_OR_DEPARTMENT_OTHER)
Admission: RE | Admit: 2023-07-01 | Discharge: 2023-07-01 | Disposition: A | Discharge status: Home / Self Care | Payer: BC Managed Care – PPO | Attending: Orthopedic Surgery | Admitting: Orthopedic Surgery

## 2023-07-01 ENCOUNTER — Encounter (HOSPITAL_BASED_OUTPATIENT_CLINIC_OR_DEPARTMENT_OTHER): Payer: Self-pay | Admitting: Orthopedic Surgery

## 2023-07-01 ENCOUNTER — Ambulatory Visit (HOSPITAL_BASED_OUTPATIENT_CLINIC_OR_DEPARTMENT_OTHER): Payer: BC Managed Care – PPO | Admitting: Certified Registered Nurse Anesthetist

## 2023-07-01 ENCOUNTER — Encounter (HOSPITAL_BASED_OUTPATIENT_CLINIC_OR_DEPARTMENT_OTHER)
Admission: RE | Disposition: A | Discharge status: Home / Self Care | Payer: Self-pay | Source: Home / Self Care | Attending: Orthopedic Surgery

## 2023-07-01 DIAGNOSIS — M65331 Trigger finger, right middle finger: Secondary | ICD-10-CM | POA: Insufficient documentation

## 2023-07-01 DIAGNOSIS — K219 Gastro-esophageal reflux disease without esophagitis: Secondary | ICD-10-CM | POA: Insufficient documentation

## 2023-07-01 DIAGNOSIS — G4730 Sleep apnea, unspecified: Secondary | ICD-10-CM | POA: Diagnosis not present

## 2023-07-01 DIAGNOSIS — I10 Essential (primary) hypertension: Secondary | ICD-10-CM | POA: Insufficient documentation

## 2023-07-01 DIAGNOSIS — M65841 Other synovitis and tenosynovitis, right hand: Secondary | ICD-10-CM | POA: Insufficient documentation

## 2023-07-01 HISTORY — DX: Attention-deficit hyperactivity disorder, unspecified type: F90.9

## 2023-07-01 HISTORY — PX: TRIGGER FINGER RELEASE: SHX641

## 2023-07-01 HISTORY — DX: Essential (primary) hypertension: I10

## 2023-07-01 HISTORY — DX: Sleep apnea, unspecified: G47.30

## 2023-07-01 SURGERY — RELEASE, A1 PULLEY, FOR TRIGGER FINGER
Anesthesia: Monitor Anesthesia Care | Site: Arm Lower | Laterality: Right

## 2023-07-01 MED ORDER — MIDAZOLAM HCL 2 MG/2ML IJ SOLN
INTRAMUSCULAR | Status: AC
  Filled 2023-07-01: qty 2

## 2023-07-01 MED ORDER — ONDANSETRON HCL 4 MG/2ML IJ SOLN: 4.0000 mg | Freq: Once | INTRAMUSCULAR | Status: DC | PRN

## 2023-07-01 MED ORDER — OXYCODONE HCL 5 MG PO TABS: 5.0000 mg | ORAL_TABLET | Freq: Four times a day (QID) | ORAL | 0 refills | Status: AC | PRN

## 2023-07-01 MED ORDER — PROPOFOL 500 MG/50ML IV EMUL
INTRAVENOUS | Status: DC | PRN
  Administered 2023-07-01: 150 ug/kg/min via INTRAVENOUS

## 2023-07-01 MED ORDER — CEFAZOLIN SODIUM-DEXTROSE 2-4 GM/100ML-% IV SOLN
INTRAVENOUS | Status: AC
Start: 2023-07-01 — End: ?
  Filled 2023-07-01: qty 100

## 2023-07-01 MED ORDER — ONDANSETRON HCL 4 MG/2ML IJ SOLN
INTRAMUSCULAR | Status: AC
  Filled 2023-07-01: qty 2

## 2023-07-01 MED ORDER — ONDANSETRON HCL 4 MG/2ML IJ SOLN
INTRAMUSCULAR | Status: DC | PRN
  Administered 2023-07-01: 4 mg via INTRAVENOUS

## 2023-07-01 MED ORDER — DEXAMETHASONE SODIUM PHOSPHATE 10 MG/ML IJ SOLN
INTRAMUSCULAR | Status: DC | PRN
  Administered 2023-07-01: 10 mg via INTRAVENOUS

## 2023-07-01 MED ORDER — LACTATED RINGERS IV SOLN: INTRAVENOUS | Status: DC

## 2023-07-01 MED ORDER — FENTANYL CITRATE (PF) 250 MCG/5ML IJ SOLN
INTRAMUSCULAR | Status: DC | PRN
  Administered 2023-07-01: 100 ug via INTRAVENOUS

## 2023-07-01 MED ORDER — MIDAZOLAM HCL 2 MG/2ML IJ SOLN
INTRAMUSCULAR | Status: DC | PRN
  Administered 2023-07-01: 2 mg via INTRAVENOUS

## 2023-07-01 MED ORDER — OXYCODONE HCL 5 MG PO TABS: 5.0000 mg | ORAL_TABLET | Freq: Once | ORAL | Status: DC | PRN

## 2023-07-01 MED ORDER — FENTANYL CITRATE (PF) 100 MCG/2ML IJ SOLN
INTRAMUSCULAR | Status: AC
  Filled 2023-07-01: qty 2

## 2023-07-01 MED ORDER — ACETAMINOPHEN 10 MG/ML IV SOLN: 1000.0000 mg | Freq: Once | INTRAVENOUS | Status: DC | PRN

## 2023-07-01 MED ORDER — OXYCODONE HCL 5 MG/5ML PO SOLN: 5.0000 mg | Freq: Once | ORAL | Status: DC | PRN

## 2023-07-01 MED ORDER — 0.9 % SODIUM CHLORIDE (POUR BTL) OPTIME
TOPICAL | Status: DC | PRN
  Administered 2023-07-01: 100 mL

## 2023-07-01 MED ORDER — FENTANYL CITRATE (PF) 100 MCG/2ML IJ SOLN: 25.0000 ug | INTRAMUSCULAR | Status: DC | PRN

## 2023-07-01 MED ORDER — BUPIVACAINE HCL 0.25 % IJ SOLN
INTRAMUSCULAR | Status: DC | PRN
  Administered 2023-07-01: 9 mL

## 2023-07-01 MED ORDER — PROPOFOL 500 MG/50ML IV EMUL
INTRAVENOUS | Status: AC
  Filled 2023-07-01: qty 50

## 2023-07-01 MED ORDER — CEFAZOLIN SODIUM-DEXTROSE 2-4 GM/100ML-% IV SOLN: 2.0000 g | INTRAVENOUS | Status: DC

## 2023-07-01 SURGICAL SUPPLY — 32 items
APL PRP STRL LF DISP 70% ISPRP (MISCELLANEOUS) ×1
BLADE SURG 15 STRL LF DISP TIS (BLADE) ×1 IMPLANT
BLADE SURG 15 STRL SS (BLADE) ×2
BNDG CMPR 5X2 KNTD ELC UNQ LF (GAUZE/BANDAGES/DRESSINGS) ×1
BNDG CMPR 9X4 STRL LF SNTH (GAUZE/BANDAGES/DRESSINGS) ×1
BNDG ELASTIC 2INX 5YD STR LF (GAUZE/BANDAGES/DRESSINGS) ×1 IMPLANT
BNDG ESMARK 4X9 LF (GAUZE/BANDAGES/DRESSINGS) ×1 IMPLANT
CHLORAPREP W/TINT 26 (MISCELLANEOUS) ×1 IMPLANT
CORD BIPOLAR FORCEPS 12FT (ELECTRODE) ×1 IMPLANT
COVER BACK TABLE 60X90IN (DRAPES) ×1 IMPLANT
CUFF TOURN SGL QUICK 18X4 (TOURNIQUET CUFF) IMPLANT
CUFF TOURN SGL QUICK 24 (TOURNIQUET CUFF)
CUFF TRNQT CYL 24X4X16.5-23 (TOURNIQUET CUFF) IMPLANT
DRAPE EXTREMITY T 121X128X90 (DISPOSABLE) ×1 IMPLANT
DRAPE SURG 17X23 STRL (DRAPES) ×1 IMPLANT
GAUZE SPONGE 4X4 12PLY STRL (GAUZE/BANDAGES/DRESSINGS) IMPLANT
GAUZE XEROFORM 1X8 LF (GAUZE/BANDAGES/DRESSINGS) ×1 IMPLANT
GLOVE BIO SURGEON STRL SZ7 (GLOVE) ×1 IMPLANT
GLOVE BIOGEL PI IND STRL 7.0 (GLOVE) ×1 IMPLANT
GOWN STRL REUS W/ TWL LRG LVL3 (GOWN DISPOSABLE) ×2 IMPLANT
GOWN STRL REUS W/TWL LRG LVL3 (GOWN DISPOSABLE) ×2
NDL HYPO 25X1 1.5 SAFETY (NEEDLE) ×1 IMPLANT
NEEDLE HYPO 25X1 1.5 SAFETY (NEEDLE) ×1
NS IRRIG 1000ML POUR BTL (IV SOLUTION) ×1 IMPLANT
PACK BASIN DAY SURGERY FS (CUSTOM PROCEDURE TRAY) ×1 IMPLANT
SHEET MEDIUM DRAPE 40X70 STRL (DRAPES) ×1 IMPLANT
SUT ETHILON 4 0 PS 2 18 (SUTURE) ×1 IMPLANT
SUT VIC AB 4-0 PS2 18 (SUTURE) IMPLANT
SYR BULB EAR ULCER 3OZ GRN STR (SYRINGE) ×1 IMPLANT
SYR CONTROL 10ML LL (SYRINGE) ×1 IMPLANT
TOWEL GREEN STERILE FF (TOWEL DISPOSABLE) ×1 IMPLANT
UNDERPAD 30X36 HEAVY ABSORB (UNDERPADS AND DIAPERS) ×1 IMPLANT

## 2023-07-01 NOTE — Anesthesia Postprocedure Evaluation (Signed)
Anesthesia Post Note  Patient: Brett Roberts  Procedure(s) Performed: RELEASE MIDDLE TRIGGER FINGER/A-1 PULLEY (Right: Arm Lower)     Patient location during evaluation: PACU Anesthesia Type: MAC Level of consciousness: awake and alert Pain management: pain level controlled Vital Signs Assessment: post-procedure vital signs reviewed and stable Respiratory status: spontaneous breathing, nonlabored ventilation, respiratory function stable and patient connected to nasal cannula oxygen Cardiovascular status: stable and blood pressure returned to baseline Postop Assessment: no apparent nausea or vomiting Anesthetic complications: no   No notable events documented.  Last Vitals:  Vitals:   07/01/23 0915 07/01/23 0926  BP: 114/83 124/87  Pulse: 62 60  Resp: 19 14  Temp:  (!) 36.2 C  SpO2: 96% 96%    Last Pain:  Vitals:   07/01/23 0926  TempSrc: Oral  PainSc: 0-No pain                 Mariann Barter

## 2023-07-01 NOTE — Interval H&P Note (Signed)
History and Physical Interval Note:  07/01/2023 8:22 AM  Brett Roberts  has presented today for surgery, with the diagnosis of Right middle trigger finger.  The various methods of treatment have been discussed with the patient and family. After consideration of risks, benefits and other options for treatment, the patient has consented to  Procedure(s) with comments: RELEASE MIDDLE TRIGGER FINGER/A-1 PULLEY (Right) - local 30 as a surgical intervention.  The patient's history has been reviewed, patient examined, no change in status, stable for surgery.  I have reviewed the patient's chart and labs.  Questions were answered to the patient's satisfaction.     [name]

## 2023-07-01 NOTE — Op Note (Signed)
   Date of Surgery: 07/01/2023  INDICATIONS: Patient is a 42 y.o.-year-old male with right middle finger stenosing tenosynovitis that has failed conservative management.  Risks, benefits, and alternatives to surgery were again discussed with the patient in the preoperative area. The patient wishes to proceed with surgery.  Informed consent was signed after our discussion.   PREOPERATIVE DIAGNOSIS:  Right middle finger stenosing tenosynovitis  POSTOPERATIVE DIAGNOSIS: Same.  PROCEDURE:  Right middle finger A1 pulley release   SURGEON: Waylan Rocher, M.D.  ASSIST:   ANESTHESIA:  Local, MAC  IV FLUIDS AND URINE: See anesthesia.  ESTIMATED BLOOD LOSS: <5 mL.  IMPLANTS: * No implants in log *   DRAINS: None  COMPLICATIONS: None  DESCRIPTION OF PROCEDURE: The patient was met in the preoperative holding area where the surgical site was marked and the informed consent form was signed.  The patient was then brought back to the operating room and remained on the stretcher.  A hand table was placed adjacent to the operative extremity and locked into place.  A tourniquet was placed on the right forearm.  A formal timeout was performed to confirm that this was the correct patient, surgical side, surgical site, and surgical procedure.  All were present and in agreement. Following formal timeout, a local block was performed using 10 mL of 0.25% plain marcaine.  The right upper extremity was then prepped and draped in the usual and sterile fashion.   Following a second formal timeout, the limb was exsanguinated and the tourniquet inflated to 250 mmHg.  A longitudinal incision was made over the A1 pulley.  The skin was incised.  Blunt dissection was used to identify the A1 pulley.  Two Ragnell retractors were placed on the radial and ulnar sides of the pulley to protect the respective neurovascular bundles.  A third Ragnell was placed at the distal aspect of the wound.  The A1 pulley was clearly  identified.  Under direct visualization, the pulley was entered sharply using a 15 blade.  Tenotomy scissors were used to complete the pulley release distally to the level of the A2 pulley.  The distal retractor was then placed in the proximal aspect of the wound.  Under direct visualization, the proximal aspect of the A1 pulley was completely released.   Following satisfactory A1 pulley release, the patient was reversed from sedation and asked to fully flex and extend the involved finger.  There was no catching or triggering present.  The tourniquet was let down and hemostasis achieved with direct pressure and bipolar electrocautery.  The wound was then thoroughly irrigated.  It was closed using 4-0 nylon sutures in a horizontal mattress fashion.  The wound was dressed with xeroform, 4x4, and an ace wrap.   All counts were correct x 2 at the end of the procedure.  The patient was then taken to the PACU in stable condition.   POSTOPERATIVE PLAN: He will be discharged to home with appropriate pain medication and discharge instructions.  I'll see him back in 10-14 days for his first postop visit.   Waylan Rocher, MD 8:56 AM

## 2023-07-01 NOTE — Anesthesia Preprocedure Evaluation (Signed)
Anesthesia Evaluation  Patient identified by MRN, date of birth, ID band Patient awake    Reviewed: Allergy & Precautions, NPO status , Patient's Chart, lab work & pertinent test results, reviewed documented beta blocker date and time   History of Anesthesia Complications Negative for: history of anesthetic complications  Airway Mallampati: III  TM Distance: >3 FB   Mouth opening: Limited Mouth Opening  Dental no notable dental hx.    Pulmonary sleep apnea , neg COPD   breath sounds clear to auscultation       Cardiovascular hypertension, (-) angina (-) CAD, (-) Past MI, (-) Cardiac Stents and (-) CABG  Rhythm:Regular Rate:Normal     Neuro/Psych  Headaches, neg Seizures PSYCHIATRIC DISORDERS         GI/Hepatic ,GERD  Medicated,,(+) neg Cirrhosis        Endo/Other    Renal/GU Renal disease     Musculoskeletal   Abdominal   Peds  Hematology   Anesthesia Other Findings   Reproductive/Obstetrics                             Anesthesia Physical Anesthesia Plan  ASA: 2  Anesthesia Plan: MAC   Post-op Pain Management:    Induction: Intravenous  PONV Risk Score and Plan: 1 and Ondansetron and Propofol infusion  Airway Management Planned:   Additional Equipment:   Intra-op Plan:   Post-operative Plan: Extubation in OR  Informed Consent: I have reviewed the patients History and Physical, chart, labs and discussed the procedure including the risks, benefits and alternatives for the proposed anesthesia with the patient or authorized representative who has indicated his/her understanding and acceptance.     Dental advisory given  Plan Discussed with: CRNA  Anesthesia Plan Comments:        Anesthesia Quick Evaluation

## 2023-07-01 NOTE — H&P (Signed)
HAND SURGERY   HPI: Patient is a 42 y.o. male who presents with right middle finger stenosing tenosynovitis that has failed conservative management..  Patient denies any changes to their medical history or new systemic symptoms today.    Past Medical History:  Diagnosis Date   Acid reflux    ADHD (attention deficit hyperactivity disorder)    Atypical chest pain 01/07/2021   Hypertension    Sleep apnea    Past Surgical History:  Procedure Laterality Date   NO PAST SURGERIES     Social History   Socioeconomic History   Marital status: Married    Spouse name: Not on file   Number of children: Not on file   Years of education: Not on file   Highest education level: Not on file  Occupational History   Not on file  Tobacco Use   Smoking status: Never   Smokeless tobacco: Never  Vaping Use   Vaping status: Never Used  Substance and Sexual Activity   Alcohol use: Yes    Alcohol/week: 1.0 - 2.0 standard drink of alcohol    Types: 1 - 2 Cans of beer per week    Comment: twice weekly    Drug use: Never   Sexual activity: Yes  Other Topics Concern   Not on file  Social History Narrative   Not on file   Social Determinants of Health   Financial Resource Strain: Low Risk  (01/21/2023)   Overall Financial Resource Strain (CARDIA)    Difficulty of Paying Living Expenses: Not hard at all  Food Insecurity: No Food Insecurity (01/21/2023)   Hunger Vital Sign    Worried About Running Out of Food in the Last Year: Never true    Ran Out of Food in the Last Year: Never true  Transportation Needs: No Transportation Needs (01/21/2023)   PRAPARE - Administrator, Civil Service (Medical): No    Lack of Transportation (Non-Medical): No  Physical Activity: Insufficiently Active (01/21/2023)   Exercise Vital Sign    Days of Exercise per Week: 2 days    Minutes of Exercise per Session: 60 min  Stress: No Stress Concern Present (01/21/2023)   [employer] of Occupational  Health - Occupational Stress Questionnaire    Feeling of Stress : Not at all  Social Connections: Moderately Isolated (01/21/2023)   Social Connection and Isolation Panel [NHANES]    Frequency of Communication with Friends and Family: Three times a week    Frequency of Social Gatherings with Friends and Family: Three times a week    Attends Religious Services: Never    Active Member of Clubs or Organizations: No    Attends Engineer, structural: Never    Marital Status: Married   Family History  Problem Relation Age of Onset   Hypertension Mother    Heart attack Father    Hypertension Sister    Hyperlipidemia Brother    Atopy Other    Eczema Other    Urticaria Other    Immunodeficiency Other    Asthma Other    Angioedema Other    Allergic rhinitis Other    - negative except otherwise stated in the family history section No Known Allergies Prior to Admission medications   Medication Sig Start Date End Date Taking? Authorizing Provider  azelastine (ASTELIN) 0.1 % nasal spray Place 1 spray into both nostrils 2 (two) times daily. Use in each nostril as directed 07/21/22  Yes Janie Morning, NP  hydrochlorothiazide (  HYDRODIURIL) 25 MG tablet Take 1 tablet (25 mg total) by mouth daily. 06/11/23  Yes [name], [name], MD  ibuprofen (ADVIL) 200 MG tablet Take 200 mg by mouth every 6 (six) hours as needed.   Yes [provider]  METHYLPHENIDATE 18 MG PO CR tablet Take 1 tablet (18 mg total) by mouth daily. 06/29/23  Yes Renne Crigler, FNP  montelukast (SINGULAIR) 10 MG tablet Take 1 tablet (10 mg total) by mouth at bedtime. 01/20/23  Yes [name], [name], MD  rosuvastatin (CRESTOR) 10 MG tablet Take 1 tablet (10 mg total) by mouth daily. 04/18/23  Yes [name], [name], MD  Vitamin D, Ergocalciferol, (DRISDOL) 1.25 MG (50000 UNIT) CAPS capsule Take 1 capsule (50,000 Units total) by mouth every 7 (seven) days. 11/18/22  Yes [name], [name], MD   No results found. - Positive ROS: All  other systems have been reviewed and were otherwise negative with the exception of those mentioned in the HPI and as above.  Physical Exam: General: No acute distress, resting comfortably Cardiovascular: BUE warm and well perfused, normal rate Respiratory: Normal WOB on RA Skin: Warm and dry Neurologic: Sensation intact distally Psychiatric: Patient is at baseline mood and affect  Right upper Extremity  Palpation over the middle finger A1 pulley pulley.  There is palpable locking/catching with active and passive range of motion of the middle finger.  He has full painless range of motion of the remaining fingers.  Sensations intact light touch at the hand.  His hand is warm and well-perfused brisk upper refill.   Assessment: 42 year old male with right middle finger stenosing tenosynovitis that has failed conservative management.  Plan: OR today for right middle finger A1 pulley release. We again reviewed the risks of surgery which include bleeding, infection, damage to neurovascular structures, persistent symptoms,delayed wound healing, need for additional surgery.  Informed consent was signed.  All questions were answered.   Marlyne Beards, M.D. [facility] 8:21 AM

## 2023-07-01 NOTE — Discharge Instructions (Addendum)
Brett Roberts, M.D. Hand Surgery  POST-OPERATIVE DISCHARGE INSTRUCTIONS   PRESCRIPTIONS: - You may have been given a prescription to be taken as directed for post-operative pain control.  You may also take over the counter ibuprofen/aleve and tylenol for pain. Take this as directed on the packaging. Do not exceed 3000 mg tylenol/acetaminophen in 24 hours.  Ibuprofen 600-800 mg (3-4) tablets by mouth every 6 hours as needed for pain.   OR  Aleve 2 tablets by mouth every 12 hours (twice daily) as needed for pain.   AND/OR  Tylenol 1000 mg (2 tablets) every 8 hours as needed for pain.  - Please use your pain medication carefully, as refills are limited and you may not be provided with one.  As stated above, please use over the counter pain medicine - it will also be helpful with decreasing your swelling.    ANESTHESIA: -After your surgery, post-surgical discomfort or pain is likely. This discomfort can last several days to a few weeks. At certain times of the day your discomfort may be more intense.   Did you receive a nerve block?   - A nerve block can provide pain relief for one hour to two days after your surgery. As long as the nerve block is working, you will experience little or no sensation in the area the surgeon operated on.  - As the nerve block wears off, you will begin to experience pain or discomfort. It is very important that you begin taking your prescribed pain medication before the nerve block fully wears off. Treating your pain at the first sign of the block wearing off will ensure your pain is better controlled and more tolerable when full-sensation returns. Do not wait until the pain is intolerable, as the medicine will be less effective. It is better to treat pain in advance than to try and catch up.   General Anesthesia:  If you did not receive a nerve block during your surgery, you will need to start taking your pain medication shortly after your surgery and  should continue to do so as prescribed by your surgeon.     ICE AND ELEVATION: - You may use ice for the first 48-72 hours, but it is not critical.   - Motion of your fingers is very important to decrease the swelling.  - Elevation, as much as possible for the next 48 hours, is critical for decreasing swelling as well as for pain relief. Elevation means when you are seated or lying down, you hand should be at or above your heart. When walking, the hand needs to be at or above the level of your elbow.  - If the bandage gets too tight, it may need to be loosened. Please contact our office and we will instruct you in how to do this.    SURGICAL BANDAGES:  - Keep your dressing and/or splint clean and dry at all times.  You can remove your dressing 7 days from now and change with a dry dressing or Band-Aids as needed thereafter. - You may place a plastic bag over your bandage to shower, but be careful, do not get your bandages wet.  - After the bandages have been removed, it is OK to get the stitches wet in a shower or with hand washing. Do Not soak or submerge the wound yet. Please do not use lotions or creams on the stitches.      HAND THERAPY:  - You may not need any. If you  do, we will begin this at your follow up visit in the clinic.    ACTIVITY AND WORK: - You are encouraged to move any fingers which are not in the bandage.  - Light use of the fingers is allowed to assist the other hand with daily hygiene and eating, but strong gripping or lifting is often uncomfortable and should be avoided.  - You might miss a variable period of time from work and hopefully this issue has been discussed prior to surgery. You may not do any heavy work with your affected hand for about 2 weeks.    [facility] [address] Floor, [address] [place], [place] [address] [phone]    Post Anesthesia Home Care Instructions  Activity: Get plenty of rest for the remainder of the day. A  responsible individual must stay with you for 24 hours following the procedure.  For the next 24 hours, DO NOT: -Drive a car -Paediatric nurse -Drink alcoholic beverages -Take any medication unless instructed by your physician -Make any legal decisions or sign important papers.  Meals: Start with liquid foods such as gelatin or soup. Progress to regular foods as tolerated. Avoid greasy, spicy, heavy foods. If nausea and/or vomiting occur, drink only clear liquids until the nausea and/or vomiting subsides. Call your physician if vomiting continues.  Special Instructions/Symptoms: Your throat may feel dry or sore from the anesthesia or the breathing tube placed in your throat during surgery. If this causes discomfort, gargle with warm salt water. The discomfort should disappear within 24 hours.  If you had a scopolamine patch placed behind your ear for the management of post- operative nausea and/or vomiting:  1. The medication in the patch is effective for 72 hours, after which it should be removed.  Wrap patch in a tissue and discard in the trash. Wash hands thoroughly with soap and water. 2. You may remove the patch earlier than 72 hours if you experience unpleasant side effects which may include dry mouth, dizziness or visual disturbances. 3. Avoid touching the patch. Wash your hands with soap and water after contact with the patch.

## 2023-07-01 NOTE — Transfer of Care (Signed)
Immediate Anesthesia Transfer of Care Note  Patient: Brett Roberts  Procedure(s) Performed: RELEASE MIDDLE TRIGGER FINGER/A-1 PULLEY (Right: Arm Lower)  Patient Location: PACU  Anesthesia Type:General  Level of Consciousness: awake, alert , and oriented  Airway & Oxygen Therapy: Patient Spontanous Breathing  Post-op Assessment: Report given to RN and Post -op Vital signs reviewed and stable  Post vital signs: Reviewed and stable  Last Vitals:  Vitals Value Taken Time  BP 117/75 07/01/23 0901  Temp    Pulse 73 07/01/23 0905  Resp 16 07/01/23 0905  SpO2 95 % 07/01/23 0905  Vitals shown include unfiled device data.  Last Pain:  Vitals:   07/01/23 0658  TempSrc: Tympanic  PainSc: 0-No pain         Complications: No notable events documented.

## 2023-07-02 ENCOUNTER — Encounter (HOSPITAL_BASED_OUTPATIENT_CLINIC_OR_DEPARTMENT_OTHER): Payer: Self-pay | Admitting: Orthopedic Surgery

## 2023-07-03 ENCOUNTER — Encounter: Payer: Self-pay | Admitting: Family Medicine

## 2023-07-03 ENCOUNTER — Ambulatory Visit: Payer: BC Managed Care – PPO | Admitting: Family Medicine

## 2023-07-03 VITALS — BP 128/84 | HR 72 | Temp 98.0°F | Resp 16 | Ht 70.5 in | Wt 265.0 lb

## 2023-07-03 DIAGNOSIS — F419 Anxiety disorder, unspecified: Secondary | ICD-10-CM | POA: Insufficient documentation

## 2023-07-03 DIAGNOSIS — M65331 Trigger finger, right middle finger: Secondary | ICD-10-CM

## 2023-07-03 DIAGNOSIS — K219 Gastro-esophageal reflux disease without esophagitis: Secondary | ICD-10-CM

## 2023-07-03 DIAGNOSIS — G4733 Obstructive sleep apnea (adult) (pediatric): Secondary | ICD-10-CM | POA: Diagnosis not present

## 2023-07-03 DIAGNOSIS — I10 Essential (primary) hypertension: Secondary | ICD-10-CM

## 2023-07-03 DIAGNOSIS — R079 Chest pain, unspecified: Secondary | ICD-10-CM

## 2023-07-03 HISTORY — DX: Anxiety disorder, unspecified: F41.9

## 2023-07-03 HISTORY — DX: Gastro-esophageal reflux disease without esophagitis: K21.9

## 2023-07-03 MED ORDER — LISINOPRIL-HYDROCHLOROTHIAZIDE 10-12.5 MG PO TABS: 1.0000 | ORAL_TABLET | Freq: Every day | ORAL | 0 refills | Status: DC

## 2023-07-03 NOTE — Assessment & Plan Note (Addendum)
Previous episode of chest pain with left arm tingling and sweating, evaluated in ER with normal EKG. No recurrence since the event. Possible stress-related symptoms. -Referral to Cardiology for further evaluation and possible stress test.

## 2023-07-03 NOTE — Assessment & Plan Note (Signed)
Hx of OSA  Patient said his prescription expired for his CPAP and will need to repeat his sleep study. Referral for sleep study

## 2023-07-03 NOTE — Progress Notes (Signed)
Subjective:  Patient ID: Brett Roberts, male    DOB: 01-04-1981  Age: 42 y.o. MRN: [id number]  Chief Complaint  Patient presents with   Hypertension    HPI The patient, with a history of high blood pressure and recent hand surgery, presents with ongoing concerns about elevated blood pressure readings despite medication. The patient reports blood pressure readings ranging from 142/100 to 145/100, with the diastolic number rarely dropping below 95. The patient's blood pressure management is crucial for his recent hand surgery.  The patient also reports an episode of chest discomfort and tingling in the left arm, which led to an ER visit. The symptoms, which included a racing heart and sweating, lasted about 10 minutes and recurred after a short period of relief. The patient attributes these symptoms to work-related stress and is considering stepping down from his current position due to the high stress levels.  The patient also mentions a recent hand surgery for trigger finger, which has been managed with pain medication. However, the patient reports difficulty sleeping, possibly due to the medication. The patient also reports a feeling of tightness and discomfort in the chest, which he has tried to manage with over-the-counter indigestion medication.      06/05/2023    8:33 AM 04/28/2023    3:11 PM 01/21/2023    7:53 AM 09/30/2021    8:47 AM 07/22/2021    9:52 AM  Depression screen PHQ 2/9  Decreased Interest 0 0 0 0 0  Down, Depressed, Hopeless 0 1 0 0 0  PHQ - 2 Score 0 1 0 0 0  Altered sleeping 1 1 0    Tired, decreased energy 1 1 2     Change in appetite 0 0 0    Feeling bad or failure about yourself  0 0 0    Trouble concentrating 0 1 0    Moving slowly or fidgety/restless 0 0 0    Suicidal thoughts 0 0 0    PHQ-9 Score 2 4 2     Difficult doing work/chores Not difficult at all Not difficult at all Not difficult at all          06/05/2023    8:33 AM  Fall Risk   Falls in the  past year? 0  Number falls in past yr: 0  Injury with Fall? 0  Risk for fall due to : No Fall Risks  Follow up Falls evaluation completed;Falls prevention discussed    Patient Care Team: [name], Fritzi Mandes, MD as PCP - General (Family Medicine)   Review of Systems  Constitutional:  Negative for chills, diaphoresis, fatigue and fever.  HENT:  Negative for congestion, ear pain, sinus pain and sore throat.   Eyes:  Negative for itching.  Respiratory:  Negative for cough and shortness of breath.   Cardiovascular:  Negative for chest pain and leg swelling.  Gastrointestinal:  Negative for abdominal pain, blood in stool, constipation, nausea and vomiting.  Genitourinary:  Negative for dysuria, frequency and urgency.  Musculoskeletal:  Positive for joint swelling (trigger finger on right hand, status post 2 days surgery). Negative for arthralgias and myalgias.  Skin:  Negative for rash.  Neurological:  Positive for headaches. Negative for weakness and light-headedness.  Psychiatric/Behavioral:  Positive for sleep disturbance. Negative for dysphoric mood. The patient is nervous/anxious.     Current Outpatient Medications on File Prior to Visit  Medication Sig Dispense Refill   azelastine (ASTELIN) 0.1 % nasal spray Place 1 spray into both nostrils  2 (two) times daily. Use in each nostril as directed 30 mL 12   hydrochlorothiazide (HYDRODIURIL) 25 MG tablet Take 1 tablet (25 mg total) by mouth daily. 90 tablet 0   ibuprofen (ADVIL) 200 MG tablet Take 200 mg by mouth every 6 (six) hours as needed.     METHYLPHENIDATE 18 MG PO CR tablet Take 1 tablet (18 mg total) by mouth daily. 30 tablet 0   montelukast (SINGULAIR) 10 MG tablet Take 1 tablet (10 mg total) by mouth at bedtime. 90 tablet 3   oxyCODONE (ROXICODONE) 5 MG immediate release tablet Take 1 tablet (5 mg total) by mouth every 6 (six) hours as needed for up to 2 days. 8 tablet 0   rosuvastatin (CRESTOR) 10 MG tablet Take 1 tablet (10 mg total)  by mouth daily. 90 tablet 1   Vitamin D, Ergocalciferol, (DRISDOL) 1.25 MG (50000 UNIT) CAPS capsule Take 1 capsule (50,000 Units total) by mouth every 7 (seven) days. 12 capsule 2   No current facility-administered medications on file prior to visit.   Past Medical History:  Diagnosis Date   Acid reflux    ADHD (attention deficit hyperactivity disorder)    Atypical chest pain 01/07/2021   Hypertension    Sleep apnea    Past Surgical History:  Procedure Laterality Date   NO PAST SURGERIES     TRIGGER FINGER RELEASE Right 07/01/2023   Procedure: RELEASE MIDDLE TRIGGER FINGER/A-1 PULLEY;  Surgeon: Marlyne Beards, MD;  Location: [facility];  Service: Orthopedics;  Laterality: Right;  local 30    Family History  Problem Relation Age of Onset   Hypertension Mother    Heart attack Father    Hypertension Sister    Hyperlipidemia Brother    Atopy Other    Eczema Other    Urticaria Other    Immunodeficiency Other    Asthma Other    Angioedema Other    Allergic rhinitis Other    Social History   Socioeconomic History   Marital status: Married    Spouse name: Not on file   Number of children: Not on file   Years of education: Not on file   Highest education level: Not on file  Occupational History   Not on file  Tobacco Use   Smoking status: Never   Smokeless tobacco: Never  Vaping Use   Vaping status: Never Used  Substance and Sexual Activity   Alcohol use: Yes    Alcohol/week: 1.0 - 2.0 standard drink of alcohol    Types: 1 - 2 Cans of beer per week    Comment: twice weekly    Drug use: Never   Sexual activity: Yes  Other Topics Concern   Not on file  Social History Narrative   Not on file   Social Determinants of Health   Financial Resource Strain: Low Risk  (01/21/2023)   Overall Financial Resource Strain (CARDIA)    Difficulty of Paying Living Expenses: Not hard at all  Food Insecurity: No Food Insecurity (01/21/2023)   Hunger Vital Sign     Worried About Running Out of Food in the Last Year: Never true    Ran Out of Food in the Last Year: Never true  Transportation Needs: No Transportation Needs (01/21/2023)   PRAPARE - Administrator, Civil Service (Medical): No    Lack of Transportation (Non-Medical): No  Physical Activity: Insufficiently Active (01/21/2023)   Exercise Vital Sign    Days of Exercise per  Week: 2 days    Minutes of Exercise per Session: 60 min  Stress: No Stress Concern Present (01/21/2023)   [employer] of Occupational Health - Occupational Stress Questionnaire    Feeling of Stress : Not at all  Social Connections: Moderately Isolated (01/21/2023)   Social Connection and Isolation Panel [NHANES]    Frequency of Communication with Friends and Family: Three times a week    Frequency of Social Gatherings with Friends and Family: Three times a week    Attends Religious Services: Never    Active Member of Clubs or Organizations: No    Attends Banker Meetings: Never    Marital Status: Married    Objective:  BP 128/84   Pulse 72   Temp 98 F (36.7 C)   Resp 16   Ht 5' 10.5" (1.791 m)   Wt 265 lb (120.2 kg)   BMI 37.49 kg/m      07/03/2023    7:34 AM 07/01/2023    9:26 AM 07/01/2023    9:15 AM  BP/Weight  Systolic BP 128 124 114  Diastolic BP 84 87 83  Wt. (Lbs) 265    BMI 37.49 kg/m2      Physical Exam Constitutional:      General: He is not in acute distress.    Appearance: Normal appearance.  Eyes:     Conjunctiva/sclera: Conjunctivae normal.  Cardiovascular:     Rate and Rhythm: Normal rate and regular rhythm.     Heart sounds: Normal heart sounds.  Pulmonary:     Effort: Pulmonary effort is normal.     Breath sounds: Normal breath sounds.  Abdominal:     General: Bowel sounds are normal.     Palpations: Abdomen is soft.     Tenderness: There is abdominal tenderness (left chest wall).  Musculoskeletal:     Right hand: Tenderness present. Decreased range  of motion.     Left hand: Normal.     Cervical back: Normal range of motion.     Comments: Status post surgery 2 days ago for trigger finger. Hand had bandage on it.   Skin:    General: Skin is warm.  Neurological:     Mental Status: He is alert. Mental status is at baseline.  Psychiatric:        Mood and Affect: Mood normal.        Behavior: Behavior normal.     Lab Results  Component Value Date   WBC 9.4 04/28/2023   HGB 14.2 04/28/2023   HCT 44.0 04/28/2023   PLT 267 04/28/2023   GLUCOSE 77 04/28/2023   CHOL 212 (H) 04/28/2023   TRIG 104 04/28/2023   HDL 48 04/28/2023   LDLCALC 145 (H) 04/28/2023   ALT 23 04/28/2023   AST 18 04/28/2023   NA 143 04/28/2023   K 4.5 04/28/2023   CL 106 04/28/2023   CREATININE 1.21 04/28/2023   BUN 19 04/28/2023   CO2 24 04/28/2023   TSH 1.710 04/28/2023   HGBA1C 5.6 01/30/2022      Assessment & Plan:    Essential hypertension Assessment & Plan: Persistent elevated blood pressure despite hydrochlorothiazide therapy. Discussed the addition of an ACE inhibitor to further control blood pressure. -Start Lisinopril 10mg /Hydrochlorothiazide 12.5mg  combination pill daily. -Discontinue previous Hydrochlorothiazide dose. -Check blood pressure twice daily at home (morning and night). -Follow-up appointment on 08/06/2023 with labs.   Orders: -     Lisinopril-hydroCHLOROthiazide; Take 1 tablet by mouth daily.  Dispense:  30 tablet; Refill: 0  Chest pain, unspecified type Assessment & Plan: Previous episode of chest pain with left arm tingling and sweating, evaluated in ER with normal EKG. No recurrence since the event. Possible stress-related symptoms. -Referral to Cardiology for further evaluation and possible stress test.  Orders: -     Ambulatory referral to Cardiology  Anxiety Assessment & Plan: Reports of stress and anxiety, particularly related to work. Considering job change to reduce stress. -Encouraged to continue with plans  to reduce work-related stress.   OSA (obstructive sleep apnea) Assessment & Plan: Hx of OSA  Patient said his prescription expired for his CPAP and will need to repeat his sleep study. Referral for sleep study  Orders: -     Ambulatory referral to Sleep Studies  Trigger middle finger of right hand Assessment & Plan: Status post trigger finger surgery two days ago. No current complications. -Continue to monitor for signs of infection (fever, redness, swelling, purulent drainage). -Remove stitches in two weeks. -Continue to use Tylenol or ibuprofen for pain and inflammation   Gastroesophageal reflux disease without esophagitis Assessment & Plan: Reports of chest discomfort and burning sensation, possibly related to acid reflux. -Consider over-the-counter antacid therapy as needed. -If symptoms persist, consider evaluation for GERD.      Meds ordered this encounter  Medications   lisinopril-hydrochlorothiazide (ZESTORETIC) 10-12.5 MG tablet    Sig: Take 1 tablet by mouth daily.    Dispense:  30 tablet    Refill:  0    Orders Placed This Encounter  Procedures   Ambulatory referral to Sleep Studies   Ambulatory referral to Cardiology     Follow-up: Return in about 5 weeks (around 08/06/2023), or keep scheduled.   Madelynn Done Smith,acting as a Neurosurgeon for Renne Crigler, FNP.,have documented all relevant documentation on the behalf of Renne Crigler, FNP,as directed by  Renne Crigler, FNP while in the presence of Renne Crigler, FNP.   An After Visit Summary was printed and given to the patient.  Total time spent on today's visit was greater than 30 minutes, including both face-to-face time and nonface-to-face time personally spent on review of chart (labs and imaging), discussing labs and goals, discussing further work-up, treatment options, referrals to specialist if needed, reviewing outside records if pertinent, answering patient's questions, and coordinating care.    Lajuana Matte, FNP [name] [phone]

## 2023-07-03 NOTE — Assessment & Plan Note (Signed)
Reports of stress and anxiety, particularly related to work. Considering job change to reduce stress. -Encouraged to continue with plans to reduce work-related stress.

## 2023-07-03 NOTE — Assessment & Plan Note (Signed)
Persistent elevated blood pressure despite hydrochlorothiazide therapy. Discussed the addition of an ACE inhibitor to further control blood pressure. -Start Lisinopril 10mg /Hydrochlorothiazide 12.5mg  combination pill daily. -Discontinue previous Hydrochlorothiazide dose. -Check blood pressure twice daily at home (morning and night). -Follow-up appointment on 08/06/2023 with labs.

## 2023-07-03 NOTE — Assessment & Plan Note (Addendum)
Status post trigger finger surgery two days ago. No current complications. -Continue to monitor for signs of infection (fever, redness, swelling, purulent drainage). -Remove stitches in two weeks. -Continue to use Tylenol or ibuprofen for pain and inflammation

## 2023-07-03 NOTE — Assessment & Plan Note (Signed)
Reports of chest discomfort and burning sensation, possibly related to acid reflux. -Consider over-the-counter antacid therapy as needed. -If symptoms persist, consider evaluation for GERD.

## 2023-07-08 ENCOUNTER — Other Ambulatory Visit: Payer: Self-pay

## 2023-07-08 DIAGNOSIS — K219 Gastro-esophageal reflux disease without esophagitis: Secondary | ICD-10-CM | POA: Insufficient documentation

## 2023-07-08 DIAGNOSIS — I10 Essential (primary) hypertension: Secondary | ICD-10-CM | POA: Insufficient documentation

## 2023-07-08 DIAGNOSIS — F909 Attention-deficit hyperactivity disorder, unspecified type: Secondary | ICD-10-CM | POA: Insufficient documentation

## 2023-07-09 ENCOUNTER — Ambulatory Visit: Payer: BC Managed Care – PPO

## 2023-07-09 VITALS — BP 122/82 | HR 84 | Ht 70.0 in | Wt 262.4 lb

## 2023-07-09 DIAGNOSIS — E782 Mixed hyperlipidemia: Secondary | ICD-10-CM | POA: Diagnosis not present

## 2023-07-09 DIAGNOSIS — I10 Essential (primary) hypertension: Secondary | ICD-10-CM | POA: Diagnosis not present

## 2023-07-09 DIAGNOSIS — R072 Precordial pain: Secondary | ICD-10-CM | POA: Diagnosis not present

## 2023-07-09 DIAGNOSIS — R079 Chest pain, unspecified: Secondary | ICD-10-CM

## 2023-07-09 MED ORDER — LISINOPRIL-HYDROCHLOROTHIAZIDE 20-25 MG PO TABS: 1.0000 | ORAL_TABLET | Freq: Every day | ORAL | 3 refills | Status: DC

## 2023-07-09 MED ORDER — METOPROLOL TARTRATE 50 MG PO TABS
ORAL_TABLET | ORAL | 0 refills | Status: DC
Start: 2023-07-09 — End: 2023-09-08

## 2023-07-09 NOTE — Patient Instructions (Signed)
Medication Instructions:  Your physician has recommended you make the following change in your medication:   START: Zestoretic 20/25 once daily  *If you need a refill on your cardiac medications before your next appointment, please call your pharmacy*   Lab Work: Your physician recommends that you return for lab work in:   Labs today: BMP  If you have labs (blood work) drawn today and your tests are completely normal, you will receive your results only by: MyChart Message (if you have MyChart) OR A paper copy in the mail If you have any lab test that is abnormal or we need to change your treatment, we will call you to review the results.   Testing/Procedures: Your physician has requested that you have an echocardiogram. Echocardiography is a painless test that uses sound waves to create images of your heart. It provides your doctor with information about the size and shape of your heart and how well your heart's chambers and valves are working. This procedure takes approximately one hour. There are no restrictions for this procedure. Please do NOT wear cologne, perfume, aftershave, or lotions (deodorant is allowed). Please arrive 15 minutes prior to your appointment time.  Please note: We ask at that you not bring children with you during ultrasound (echo/ vascular) testing. Due to room size and safety concerns, children are not allowed in the ultrasound rooms during exams. Our front office staff cannot provide observation of children in our lobby area while testing is being conducted. An adult accompanying a patient to their appointment will only be allowed in the ultrasound room at the discretion of the ultrasound technician under special circumstances. We apologize for any inconvenience.     Your cardiac CT will be scheduled at one of the below locations:   [facility] [address] [place], [place] [address] [phone]  OR  [facility] [address] [place], [place] [address] [phone]  OR   [facility] [address] [place], [place] [address] [phone]  If scheduled at [facility], please arrive at the [facility] and Children's Entrance (Entrance C2) of [facility] 30 minutes prior to test start time. You can use the FREE valet parking offered at entrance C (encouraged to control the heart rate for the test)  Proceed to the [facility] Radiology Department (first floor) to check-in and test prep.  All radiology patients and guests should use entrance C2 at [facility], accessed from [facility], even though the hospital's physical address listed is [address].    If scheduled at [facility] or [facility], please arrive 15 mins early for check-in and test prep.  There is spacious parking and easy access to the radiology department from the [facility] entrance. Please enter here and check-in with the desk attendant.   Please follow these instructions carefully (unless otherwise directed):  An IV will be required for this test and Nitroglycerin will be given.  Hold all erectile dysfunction medications at least 3 days (72 hrs) prior to test. (Ie viagra, cialis, sildenafil, tadalafil, etc)   On the Night Before the Test: Be sure to Drink plenty of water. Do not consume any caffeinated/decaffeinated beverages or chocolate 12 hours prior to your test. Do not take any antihistamines 12 hours prior to your test.  On the Day of the Test: Drink plenty of water until 1 hour prior  to the test. Do not eat any food 1 hour prior to test. You may take your regular medications prior to the test.  Take metoprolol (Lopressor) as prescribed      After the Test: Drink plenty of water. After receiving IV contrast, you may experience a mild flushed feeling. This is  normal. On occasion, you may experience a mild rash up to 24 hours after the test. This is not dangerous. If this occurs, you can take Benadryl 25 mg and increase your fluid intake. If you experience trouble breathing, this can be serious. If it is severe call 911 IMMEDIATELY. If it is mild, please call our office.  We will call to schedule your test 2-4 weeks out understanding that some insurance companies will need an authorization prior to the service being performed.   For more information and frequently asked questions, please visit our website : [email or web address]  For non-scheduling related questions, please contact the cardiac imaging nurse navigator should you have any questions/concerns: Cardiac Imaging Nurse Navigators Direct Office Dial: [phone]   For scheduling needs, including cancellations and rescheduling, please call [place], [phone].  Follow-Up: At [facility], you and your health needs are our priority.  As part of our continuing mission to provide you with exceptional heart care, we have created designated Provider Care Teams.  These Care Teams include your primary Cardiologist (physician) and Advanced Practice Providers (APPs -  Physician Assistants and Nurse Practitioners) who all work together to provide you with the care you need, when you need it.  We recommend signing up for the patient portal called "MyChart".  Sign up information is provided on this After Visit Summary.  MyChart is used to connect with patients for Virtual Visits (Telemedicine).  Patients are able to view lab/test results, encounter notes, upcoming appointments, etc.  Non-urgent messages can be sent to your provider as well.   To learn more about what you can do with MyChart, go to [email or web address].    Your next appointment:   2 month(s)  Provider:   Huntley Dec, MD    Other Instructions None

## 2023-07-09 NOTE — Assessment & Plan Note (Signed)
No further episodes. Has cardiovascular risk factors in form of family history, obesity, dyslipidemia and stress.  He wishes to embark on aggressive weight management and exercise regimen.  Will proceed with cardiac CT coronary angiogram to assess atherosclerotic burden and for any obstructive disease before he embarks on exercise regimen.  Advised to avoid moderate to heavy exertion until this can be completed.  Metoprolol tartrate 50 mg on the night before and on the morning of the test to help optimize heart rates for the imaging. If significant atherosclerotic burden noted will start aspirin and titrate up the dose of statins.

## 2023-07-09 NOTE — Progress Notes (Signed)
Cardiology Consultation:    Date:  07/09/2023   ID:  Brett Roberts, DOB [birth year], MRN [id number]  PCP:  Blane Ohara, MD  Cardiologist:  Marlyn Corporal Madlynn Lundeen, MD   Referring MD: Renne Crigler, FNP   No chief complaint on file.    ASSESSMENT AND PLAN:   Brett Roberts 42/M with history of hypertension, right middle finger tenosynovitis, hyperlipidemia, GERD, ADD, obesity, anxiety and stress at work, obstructive sleep apnea currently not on treatment and in the process of having this 3 scheduled.   Problem List Items Addressed This Visit     Chest pain - Primary    No further episodes. Has cardiovascular risk factors in form of family history, obesity, dyslipidemia and stress.  He wishes to embark on aggressive weight management and exercise regimen.  Will proceed with cardiac CT coronary angiogram to assess atherosclerotic burden and for any obstructive disease before he embarks on exercise regimen.  Advised to avoid moderate to heavy exertion until this can be completed.  Metoprolol tartrate 50 mg on the night before and on the morning of the test to help optimize heart rates for the imaging. If significant atherosclerotic burden noted will start aspirin and titrate up the dose of statins.      Relevant Orders   EKG 12-Lead (Completed)   Essential hypertension    Improved blood pressure readings, remains suboptimal with diastolic above 80. Target below 130 over 80 mmHg. Home readings of blood pressures also somewhat suboptimal.  Titrate up the current dose of lisinopril/hydrochlorothiazide to 20 mg / 25 mg once daily.  Proceed with transthoracic echocardiogram to assess underlying cardiac structure and function.       Mixed hyperlipidemia    Last lipid panel from 04/28/2023 total cholesterol 212, LDL 145, HDL 48, triglycerides 104, suboptimal. His 10-year ASCVD score was 1.8%. Continue with current dose of Crestor 10 mg once daily. If he does have evidence of  atherosclerosis on CT coronary angiogram, we will further escalate intensity of the statin regimen. Advised to continue with dietary and lifestyle modifications to optimize his LDL levels further.          History of Present Illness:    Brett Roberts is a 42 y.o. male who is being seen today for the evaluation of chest pain at the request of Renne Crigler, FNP.   Has history of hypertension, right middle finger tenosynovitis, hyperlipidemia, GERD, ADD, obesity, anxiety and stress at work, obstructive sleep apnea currently not on CPAP. He had an exercise tolerance test from January 29, 2021 that was normal, exercising 10 minutes, attaining 91% of MPHR 11.6 METS, this was done in the setting of tiredness and fatigue which she was experiencing around the time and was later identified with sleep apnea.  He recently had a visit to the ER at [facility] emergency department for chest pain lasting for 45 minutes associated with nausea, sweating and tingling in the arm, hemodynamics showed hypertension 145/103 mmHg, afebrile, normal pulse ox, troponin I x 2 was unremarkable less than 0.03, Chest x-ray unremarkable, Discharged home to follow-up with PCP as outpatient.  Here for the visit today by himself.  Lives at home with his wife and 2 kids.  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  Mentions the pain was sudden while he was at rest driving his car.  Pressure-like and radiating to the left arm  associated with numbness and tingling and nausea and diaphoresis.  Mentions since his visit to the ER he has not had any further experiences of chest pain.  Blood pressures have been improving with adjusted antihypertensive doses.  He does continue to feel tired and weak.  Short of breath at times.  No regular exercise.  No symptoms at rest.  Denies orthopnea, paroxysmal nocturnal dyspnea, pedal edema.  No  palpitations, syncopal or near syncopal episodes.  Recent surgery for right middle finger tenosynovitis, recovering well.  Mentions blood pressures at home have improved with recent titrating up of the antihypertensive medications by PCP.  Reports systolic blood pressure ranges from 130s to 140s and diastolic 80s-100s.  Has been on medications for ADD for the last 2 to 3 months,has been recently switched to Concerta.  EKG in the clinic today shows sinus rhythm heart rate 84/min, normal PR interval, normal QRS axis duration 74 ms, T wave inversions in inferior leads.  For blood pressure PCP recently started lisinopril 10 mg, hydrochlorothiazide 12.5 mg combination pill in place of hydrochlorothiazide alone.  Does not smoke. Alcohol up to 2 beers a week. No recreational drug use.  Give significant family history of coronary artery disease on his father side who passed away in his 38s after an MI.  Multiple uncles of his own father side had MIs at a early age. He has 2 siblings with high blood pressure.  Recent labs from 06/09/2023 at ER visit noted sodium 137, potassium 3.7 normal BUN 17, creatinine 1.2, EGFR 77 Normal AST and ALT, alkaline phosphatase. Hemoglobin 14.3, hematocrit 43.6, WBC 10.6, platelets 252, normal  Previous TSH checked from 04/28/2023 normal 1.7 Lipid panel from 04/28/2023 noted total cholesterol 212, LDL 145, HDL 48, triglycerides 104, suboptimal  The 10-year ASCVD risk score (Arnett DK, et al., 2019) is: 1.5%   Values used to calculate the score:     Age: 38 years     Sex: Male     Is Non-Hispanic African American: No     Diabetic: No     Tobacco smoker: No     Systolic Blood Pressure: 122 mmHg     Is BP treated: No     HDL Cholesterol: 48 mg/dL     Total Cholesterol: 212 mg/dL    Past Medical History:  Diagnosis Date   Acid reflux    ADHD (attention deficit hyperactivity disorder)    Allergic rhinitis due to allergen 08/15/2021   Anxiety 07/03/2023    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/2023   Atypical chest pain 01/07/2021   Chest pain 01/07/2021   Class 2 severe obesity due to excess calories with serious comorbidity and body mass index (BMI) of 36.0 to 36.9 in adult [facility]) 10/18/2022   Essential hypertension 01/07/2021   Family history of premature CAD 01/07/2021   Gastroesophageal reflux disease without esophagitis 07/03/2023   Headache above the eye region 05/13/2023   Hypertension    Mixed hyperlipidemia 10/18/2022   Morbid obesity (HCC) 05/02/2023   OSA (obstructive sleep apnea) 01/07/2021   Trigger middle finger 11/19/2020   Vitamin D deficiency 05/02/2023    Past Surgical History:  Procedure Laterality Date   TRIGGER FINGER RELEASE Right 07/01/2023   Procedure: RELEASE MIDDLE TRIGGER FINGER/A-1 PULLEY;  Surgeon: Marlyne Beards, MD;  Location: [facility];  Service: Orthopedics;  Laterality: Right;  local 30    Current Medications: Current Meds  Medication Sig   azelastine (ASTELIN) 0.1 % nasal spray Place 1  spray into both nostrils 2 (two) times daily. Use in each nostril as directed (Patient taking differently: Place 1 spray into both nostrils as needed for rhinitis or allergies.)   ibuprofen (ADVIL) 200 MG tablet Take 200 mg by mouth every 6 (six) hours as needed for headache, mild pain (pain score 1-3) or moderate pain (pain score 4-6).   METHYLPHENIDATE 18 MG PO CR tablet Take 1 tablet (18 mg total) by mouth daily.   montelukast (SINGULAIR) 10 MG tablet Take 1 tablet (10 mg total) by mouth at bedtime.   rosuvastatin (CRESTOR) 10 MG tablet Take 1 tablet (10 mg total) by mouth daily.   Vitamin D, Ergocalciferol, (DRISDOL) 1.25 MG (50000 UNIT) CAPS capsule Take 1 capsule (50,000 Units total) by mouth every 7 (seven) days.   [DISCONTINUED] lisinopril-hydrochlorothiazide (ZESTORETIC) 10-12.5 MG tablet Take 1 tablet by mouth daily.     Allergies:   Patient has no known allergies.    Social History   Socioeconomic History   Marital status: Married    Spouse name: Not on file   Number of children: Not on file   Years of education: Not on file   Highest education level: Not on file  Occupational History   Not on file  Tobacco Use   Smoking status: Never   Smokeless tobacco: Never  Vaping Use   Vaping status: Never Used  Substance and Sexual Activity   Alcohol use: Yes    Alcohol/week: 1.0 - 2.0 standard drink of alcohol    Types: 1 - 2 Cans of beer per week    Comment: twice weekly    Drug use: Never   Sexual activity: Yes  Other Topics Concern   Not on file  Social History Narrative   Not on file   Social Determinants of Health   Financial Resource Strain: Low Risk  (01/21/2023)   Overall Financial Resource Strain (CARDIA)    Difficulty of Paying Living Expenses: Not hard at all  Food Insecurity: No Food Insecurity (01/21/2023)   Hunger Vital Sign    Worried About Running Out of Food in the Last Year: Never true    Ran Out of Food in the Last Year: Never true  Transportation Needs: No Transportation Needs (01/21/2023)   PRAPARE - Administrator, Civil Service (Medical): No    Lack of Transportation (Non-Medical): No  Physical Activity: Insufficiently Active (01/21/2023)   Exercise Vital Sign    Days of Exercise per Week: 2 days    Minutes of Exercise per Session: 60 min  Stress: No Stress Concern Present (01/21/2023)   [employer] of Occupational Health - Occupational Stress Questionnaire    Feeling of Stress : Not at all  Social Connections: Moderately Isolated (01/21/2023)   Social Connection and Isolation Panel [NHANES]    Frequency of Communication with Friends and Family: Three times a week    Frequency of Social Gatherings with Friends and Family: Three times a week    Attends Religious Services: Never    Active Member of Clubs or Organizations: No    Attends Banker Meetings: Never    Marital Status: Married      Family History: The patient's family history includes Allergic rhinitis in an other family member; Angioedema in an other family member; Asthma in an other family member; Atopy in an other family member; Eczema in an other family member; Heart attack in his father; Hyperlipidemia in his brother; Hypertension in his mother and sister; Immunodeficiency in  an other family member; Urticaria in an other family member. ROS:   Please see the history of present illness.    All 14 point review of systems negative except as described per history of present illness.  EKGs/Labs/Other Studies Reviewed:    The following studies were reviewed today:   EKG:  EKG Interpretation Date/Time:  Thursday July 09 2023 09:14:07 EST Ventricular Rate:  84 PR Interval:  166 QRS Duration:  74 QT Interval:  352 QTC Calculation: 415 R Axis:   31  Text Interpretation: Normal sinus rhythm Anterior infarct , age undetermined Abnormal ECG When compared with ECG of 21-Apr-2020 11:27, PR interval has decreased Anterior infarct is now Present Confirmed by Huntley Dec reddy [phone]) on 07/09/2023 8:16:48 AM    Recent Labs: 04/28/2023: ALT 23; BUN 19; Creatinine, Ser 1.21; Hemoglobin 14.2; Platelets 267; Potassium 4.5; Sodium 143; TSH 1.710  Recent Lipid Panel    Component Value Date/Time   CHOL 212 (H) 04/28/2023 1540   TRIG 104 04/28/2023 1540   HDL 48 04/28/2023 1540   CHOLHDL 4.4 04/28/2023 1540   LDLCALC 145 (H) 04/28/2023 1540    Physical Exam:    VS:  BP 122/82   Pulse 84   Ht 5\' 10"  (1.778 m)   Wt 262 lb 6.4 oz (119 kg)   SpO2 95%   BMI 37.65 kg/m     Wt Readings from Last 3 Encounters:  07/09/23 262 lb 6.4 oz (119 kg)  07/03/23 265 lb (120.2 kg)  07/01/23 261 lb 3.9 oz (118.5 kg)     GENERAL:  Well nourished, well developed in no acute distress NECK: No JVD; No carotid bruits CARDIAC: RRR, S1 and S2 present, no murmurs, no rubs, no gallops CHEST:  Clear to auscultation without  rales, wheezing or rhonchi  Extremities: No pitting pedal edema. Pulses bilaterally symmetric with radial 2+ and dorsalis pedis 2+.  Right hand wound dressing from recent surgery. NEUROLOGIC:  Alert and oriented x 3  Medication Adjustments/Labs and Tests Ordered: Current medicines are reviewed at length with the patient today.  Concerns regarding medicines are outlined above.  Orders Placed This Encounter  Procedures   EKG 12-Lead   No orders of the defined types were placed in this encounter.   Signed, Cecille Amsterdam, MD, MPH, [facility]. 07/09/2023 8:57 AM    [facility]

## 2023-07-09 NOTE — Assessment & Plan Note (Signed)
Improved blood pressure readings, remains suboptimal with diastolic above 80. Target below 130 over 80 mmHg. Home readings of blood pressures also somewhat suboptimal.  Titrate up the current dose of lisinopril/hydrochlorothiazide to 20 mg / 25 mg once daily.  Proceed with transthoracic echocardiogram to assess underlying cardiac structure and function.

## 2023-07-09 NOTE — Assessment & Plan Note (Signed)
Last lipid panel from 04/28/2023 total cholesterol 212, LDL 145, HDL 48, triglycerides 104, suboptimal. His 10-year ASCVD score was 1.8%. Continue with current dose of Crestor 10 mg once daily. If he does have evidence of atherosclerosis on CT coronary angiogram, we will further escalate intensity of the statin regimen. Advised to continue with dietary and lifestyle modifications to optimize his LDL levels further.

## 2023-07-10 LAB — BASIC METABOLIC PANEL
BUN/Creatinine Ratio: 24 — ABNORMAL HIGH (ref 9–20)
BUN: 26 mg/dL — ABNORMAL HIGH (ref 6–24)
CO2: 26 mmol/L (ref 20–29)
Calcium: 9.7 mg/dL (ref 8.7–10.2)
Chloride: 103 mmol/L (ref 96–106)
Creatinine, Ser: 1.1 mg/dL (ref 0.76–1.27)
Glucose: 93 mg/dL (ref 70–99)
Potassium: 4.6 mmol/L (ref 3.5–5.2)
Sodium: 141 mmol/L (ref 134–144)
eGFR: 86 mL/min/{1.73_m2} (ref >59)

## 2023-07-20 ENCOUNTER — Encounter (HOSPITAL_COMMUNITY): Payer: Self-pay

## 2023-07-21 ENCOUNTER — Telehealth (HOSPITAL_COMMUNITY): Payer: Self-pay | Admitting: *Deleted

## 2023-07-21 NOTE — Telephone Encounter (Signed)
Reaching out to patient to offer assistance regarding upcoming cardiac imaging study; pt verbalizes understanding of appt date/time, parking situation and where to check in, pre-test NPO status and medications ordered, and verified current allergies; name and call back number provided for further questions should they arise Hayley Sharpe RN Navigator Cardiac Imaging [facility] [phone] office [phone] cell  

## 2023-07-22 ENCOUNTER — Ambulatory Visit (HOSPITAL_COMMUNITY)
Admission: RE | Admit: 2023-07-22 | Discharge: 2023-07-22 | Disposition: A | Discharge status: Home / Self Care | Payer: BC Managed Care – PPO | Source: Ambulatory Visit

## 2023-07-22 DIAGNOSIS — R072 Precordial pain: Secondary | ICD-10-CM

## 2023-07-22 MED ORDER — NITROGLYCERIN 0.4 MG SL SUBL
SUBLINGUAL_TABLET | SUBLINGUAL | Status: AC
  Filled 2023-07-22: qty 2

## 2023-07-22 MED ORDER — NITROGLYCERIN 0.4 MG SL SUBL
0.8000 mg | SUBLINGUAL_TABLET | Freq: Once | SUBLINGUAL | Status: AC
Start: 2023-07-22 — End: 2023-07-22
  Administered 2023-07-22: 0.8 mg via SUBLINGUAL

## 2023-07-22 MED ORDER — IOHEXOL 350 MG/ML SOLN
95.0000 mL | Freq: Once | INTRAVENOUS | Status: AC | PRN
  Administered 2023-07-22: 95 mL via INTRAVENOUS

## 2023-07-24 ENCOUNTER — Ambulatory Visit (HOSPITAL_COMMUNITY): Payer: BC Managed Care – PPO

## 2023-07-27 DIAGNOSIS — R5383 Other fatigue: Secondary | ICD-10-CM | POA: Diagnosis not present

## 2023-07-27 DIAGNOSIS — G4733 Obstructive sleep apnea (adult) (pediatric): Secondary | ICD-10-CM | POA: Diagnosis not present

## 2023-07-27 DIAGNOSIS — J4520 Mild intermittent asthma, uncomplicated: Secondary | ICD-10-CM | POA: Diagnosis not present

## 2023-07-27 DIAGNOSIS — R400 Somnolence: Secondary | ICD-10-CM | POA: Diagnosis not present

## 2023-07-30 ENCOUNTER — Ambulatory Visit: Payer: BC Managed Care – PPO

## 2023-07-30 DIAGNOSIS — R079 Chest pain, unspecified: Secondary | ICD-10-CM | POA: Diagnosis not present

## 2023-07-30 DIAGNOSIS — E782 Mixed hyperlipidemia: Secondary | ICD-10-CM

## 2023-07-30 DIAGNOSIS — I10 Essential (primary) hypertension: Secondary | ICD-10-CM | POA: Diagnosis not present

## 2023-07-30 DIAGNOSIS — R072 Precordial pain: Secondary | ICD-10-CM

## 2023-07-30 MED ORDER — PERFLUTREN LIPID MICROSPHERE
1.0000 mL | INTRAVENOUS | Status: AC | PRN
  Administered 2023-07-30: 7 mL via INTRAVENOUS

## 2023-07-31 DIAGNOSIS — I2510 Atherosclerotic heart disease of native coronary artery without angina pectoris: Secondary | ICD-10-CM

## 2023-07-31 HISTORY — DX: Atherosclerotic heart disease of native coronary artery without angina pectoris: I25.10

## 2023-07-31 LAB — ECHOCARDIOGRAM COMPLETE: S' Lateral: 3.1 cm

## 2023-08-06 ENCOUNTER — Encounter: Payer: Self-pay | Admitting: Family Medicine

## 2023-08-06 ENCOUNTER — Ambulatory Visit: Payer: BC Managed Care – PPO | Admitting: Family Medicine

## 2023-08-06 VITALS — BP 130/74 | HR 81 | Temp 97.6°F | Ht 70.0 in | Wt 268.0 lb

## 2023-08-06 DIAGNOSIS — G4733 Obstructive sleep apnea (adult) (pediatric): Secondary | ICD-10-CM | POA: Diagnosis not present

## 2023-08-06 DIAGNOSIS — I10 Essential (primary) hypertension: Secondary | ICD-10-CM | POA: Diagnosis not present

## 2023-08-06 DIAGNOSIS — J028 Acute pharyngitis due to other specified organisms: Secondary | ICD-10-CM

## 2023-08-06 DIAGNOSIS — J301 Allergic rhinitis due to pollen: Secondary | ICD-10-CM

## 2023-08-06 DIAGNOSIS — F988 Other specified behavioral and emotional disorders with onset usually occurring in childhood and adolescence: Secondary | ICD-10-CM

## 2023-08-06 DIAGNOSIS — E782 Mixed hyperlipidemia: Secondary | ICD-10-CM

## 2023-08-06 DIAGNOSIS — J020 Streptococcal pharyngitis: Secondary | ICD-10-CM | POA: Diagnosis not present

## 2023-08-06 DIAGNOSIS — K219 Gastro-esophageal reflux disease without esophagitis: Secondary | ICD-10-CM | POA: Diagnosis not present

## 2023-08-06 DIAGNOSIS — E559 Vitamin D deficiency, unspecified: Secondary | ICD-10-CM

## 2023-08-06 LAB — POCT RAPID STREP A (OFFICE): Rapid Strep A Screen: POSITIVE — AB

## 2023-08-06 MED ORDER — LISINOPRIL-HYDROCHLOROTHIAZIDE 20-25 MG PO TABS: 1.0000 | ORAL_TABLET | Freq: Every day | ORAL | 3 refills | Status: DC

## 2023-08-06 MED ORDER — AMOXICILLIN 875 MG PO TABS: 875.0000 mg | ORAL_TABLET | Freq: Two times a day (BID) | ORAL | 0 refills | Status: AC

## 2023-08-06 MED ORDER — FLUTICASONE PROPIONATE 50 MCG/ACT NA SUSP: 2.0000 | Freq: Every day | NASAL | 11 refills | Status: DC

## 2023-08-06 MED ORDER — TRIAMCINOLONE ACETONIDE 40 MG/ML IJ SUSP
80.0000 mg | Freq: Once | INTRAMUSCULAR | Status: AC
  Administered 2023-08-06: 80 mg via INTRAMUSCULAR

## 2023-08-06 NOTE — Progress Notes (Signed)
Subjective:  Patient ID: Brett Roberts, male    DOB: 10-Jun-1981  Age: 42 y.o. MRN: [id number]  Chief Complaint  Patient presents with   Medical Management of Chronic Issues    HPI History of Present Illness The patient, with a history of hyperlipidemia and allergies, presents for a pre-travel visit. He reports worsening allergy symptoms, including nasal congestion and postnasal drip, which have been unresponsive to Claritin and montelukast. He requests a steroid shot for symptom relief during his upcoming cruise. He also reports a sore throat and cough, which he attributes to postnasal drip.  The patient has been managing his hyperlipidemia with 10mg  of a cholesterol medication, which he recently increased to 20mg  on his own. He reports a history of chest pain and has been evaluated by a cardiologist. Recent cardiac imaging, including a CT scan and echocardiogram, showed minor plaque buildup but were otherwise normal. He continues to experience occasional chest discomfort, which he suspects may be related to food.  The patient also reports taking Ritalin for an unspecified condition, which he believes is working well. He has a sleep study scheduled for evaluation of suspected sleep apnea.     06/05/2023    8:33 AM 04/28/2023    3:11 PM 01/21/2023    7:53 AM 09/30/2021    8:47 AM 07/22/2021    9:52 AM  Depression screen PHQ 2/9  Decreased Interest 0 0 0 0 0  Down, Depressed, Hopeless 0 1 0 0 0  PHQ - 2 Score 0 1 0 0 0  Altered sleeping 1 1 0    Tired, decreased energy 1 1 2     Change in appetite 0 0 0    Feeling bad or failure about yourself  0 0 0    Trouble concentrating 0 1 0    Moving slowly or fidgety/restless 0 0 0    Suicidal thoughts 0 0 0    PHQ-9 Score 2 4 2     Difficult doing work/chores Not difficult at all Not difficult at all Not difficult at all          08/06/2023    2:10 PM  Fall Risk   Falls in the past year? 0  Number falls in past yr: 0  Injury with Fall? 0   Risk for fall due to : No Fall Risks  Follow up Falls evaluation completed    Patient Care Team: Blane Ohara, MD as PCP - General (Family Medicine)   Review of Systems  Constitutional:  Negative for chills, diaphoresis, fatigue and fever.  HENT:  Negative for congestion, ear pain and sore throat.   Respiratory:  Negative for cough and shortness of breath.   Cardiovascular:  Negative for chest pain and leg swelling.  Gastrointestinal:  Negative for abdominal pain, constipation, diarrhea, nausea and vomiting.  Genitourinary:  Negative for dysuria and urgency.  Musculoskeletal:  Negative for arthralgias and myalgias.  Neurological:  Negative for dizziness and headaches.  Psychiatric/Behavioral:  Negative for dysphoric mood.     Current Outpatient Medications on File Prior to Visit  Medication Sig Dispense Refill   azelastine (ASTELIN) 0.1 % nasal spray Place 1 spray into both nostrils 2 (two) times daily. Use in each nostril as directed (Patient taking differently: Place 1 spray into both nostrils as needed for rhinitis or allergies.) 30 mL 12   ibuprofen (ADVIL) 200 MG tablet Take 200 mg by mouth every 6 (six) hours as needed for headache, mild pain (pain score 1-3) or  moderate pain (pain score 4-6).     METHYLPHENIDATE 18 MG PO CR tablet Take 1 tablet (18 mg total) by mouth daily. 30 tablet 0   metoprolol tartrate (LOPRESSOR) 50 MG tablet Take 1 tablet = 50 mg the night before your CT and 1 tablet = 50 mg the morning of your procedure 2 tablet 0   montelukast (SINGULAIR) 10 MG tablet Take 1 tablet (10 mg total) by mouth at bedtime. 90 tablet 3   Vitamin D, Ergocalciferol, (DRISDOL) 1.25 MG (50000 UNIT) CAPS capsule Take 1 capsule (50,000 Units total) by mouth every 7 (seven) days. 12 capsule 2   No current facility-administered medications on file prior to visit.   Past Medical History:  Diagnosis Date   Acid reflux    ADHD (attention deficit hyperactivity disorder)    Allergic  rhinitis due to allergen 08/15/2021   Anxiety 07/03/2023   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/2023   Atypical chest pain 01/07/2021   Chest pain 01/07/2021   Class 2 severe obesity due to excess calories with serious comorbidity and body mass index (BMI) of 36.0 to 36.9 in adult [facility]) 10/18/2022   Essential hypertension 01/07/2021   Family history of premature CAD 01/07/2021   Gastroesophageal reflux disease without esophagitis 07/03/2023   Headache above the eye region 05/13/2023   Hypertension    Mixed hyperlipidemia 10/18/2022   Morbid obesity (HCC) 05/02/2023   OSA (obstructive sleep apnea) 01/07/2021   Trigger middle finger 11/19/2020   Vitamin D deficiency 05/02/2023   Past Surgical History:  Procedure Laterality Date   TRIGGER FINGER RELEASE Right 07/01/2023   Procedure: RELEASE MIDDLE TRIGGER FINGER/A-1 PULLEY;  Surgeon: Marlyne Beards, MD;  Location: [facility];  Service: Orthopedics;  Laterality: Right;  local 30    Family History  Problem Relation Age of Onset   Hypertension Mother    Heart attack Father    Hypertension Sister    Hyperlipidemia Brother    Atopy Other    Eczema Other    Urticaria Other    Immunodeficiency Other    Asthma Other    Angioedema Other    Allergic rhinitis Other    Social History   Socioeconomic History   Marital status: Married    Spouse name: Not on file   Number of children: Not on file   Years of education: Not on file   Highest education level: Not on file  Occupational History   Not on file  Tobacco Use   Smoking status: Never   Smokeless tobacco: Never  Vaping Use   Vaping status: Never Used  Substance and Sexual Activity   Alcohol use: Yes    Alcohol/week: 1.0 - 2.0 standard drink of alcohol    Types: 1 - 2 Cans of beer per week    Comment: twice weekly    Drug use: Never   Sexual activity: Yes  Other Topics Concern   Not on file  Social History Narrative   Not on  file   Social Drivers of Health   Financial Resource Strain: Low Risk  (01/21/2023)   Overall Financial Resource Strain (CARDIA)    Difficulty of Paying Living Expenses: Not hard at all  Food Insecurity: No Food Insecurity (01/21/2023)   Hunger Vital Sign    Worried About Running Out of Food in the Last Year: Never true    Ran Out of Food in the Last Year: Never true  Transportation Needs: No Transportation Needs (01/21/2023)  PRAPARE - Administrator, Civil Service (Medical): No    Lack of Transportation (Non-Medical): No  Physical Activity: Insufficiently Active (01/21/2023)   Exercise Vital Sign    Days of Exercise per Week: 2 days    Minutes of Exercise per Session: 60 min  Stress: No Stress Concern Present (01/21/2023)   [employer] of Occupational Health - Occupational Stress Questionnaire    Feeling of Stress : Not at all  Social Connections: Moderately Isolated (01/21/2023)   Social Connection and Isolation Panel [NHANES]    Frequency of Communication with Friends and Family: Three times a week    Frequency of Social Gatherings with Friends and Family: Three times a week    Attends Religious Services: Never    Active Member of Clubs or Organizations: No    Attends Banker Meetings: Never    Marital Status: Married    Objective:  BP 130/74   Pulse 81   Temp 97.6 F (36.4 C)   Ht 5\' 10"  (1.778 m)   Wt 268 lb (121.6 kg)   SpO2 96%   BMI 38.45 kg/m      08/06/2023    2:08 PM 07/22/2023    4:15 PM 07/22/2023    3:45 PM  BP/Weight  Systolic BP 130 117 124  Diastolic BP 74 70 74  Wt. (Lbs) 268    BMI 38.45 kg/m2      Physical Exam Constitutional:      Appearance: Normal appearance. He is obese.  HENT:     Nose: Congestion (erythema BL turbinates.) present. No rhinorrhea.     Mouth/Throat:     Mouth: Mucous membranes are moist.     Pharynx: Posterior oropharyngeal erythema present. No oropharyngeal exudate.  Cardiovascular:      Rate and Rhythm: Normal rate and regular rhythm.     Heart sounds: Normal heart sounds.  Pulmonary:     Effort: Pulmonary effort is normal. No respiratory distress.     Breath sounds: Normal breath sounds. No wheezing, rhonchi or rales.  Lymphadenopathy:     Cervical: No cervical adenopathy.  Neurological:     Mental Status: He is alert and oriented to person, place, and time.  Psychiatric:        Mood and Affect: Mood normal.        Behavior: Behavior normal.     Diabetic Foot Exam - Simple   No data filed      Lab Results  Component Value Date   WBC 6.7 08/06/2023   HGB 14.2 08/06/2023   HCT 44.8 08/06/2023   PLT 248 08/06/2023   GLUCOSE 85 08/06/2023   CHOL 145 08/06/2023   TRIG 96 08/06/2023   HDL 40 08/06/2023   LDLCALC 87 08/06/2023   ALT 26 08/06/2023   AST 24 08/06/2023   NA 143 08/06/2023   K 4.9 08/06/2023   CL 104 08/06/2023   CREATININE 1.25 08/06/2023   BUN 19 08/06/2023   CO2 24 08/06/2023   TSH 1.710 04/28/2023   HGBA1C 5.6 01/30/2022      Assessment & Plan:    Essential hypertension Assessment & Plan: Well controlled.  Continue with lisinopril-hydrochlorothiazide 20-25 mg daily. Continue to work on eating a healthy diet and exercise.  Labs drawn today.    Orders: -     Lisinopril-hydroCHLOROthiazide; Take 1 tablet by mouth daily.  Dispense: 90 tablet; Refill: 3 -     CBC with Differential/Platelet -     Comprehensive metabolic  panel -     Lipid panel  OSA (obstructive sleep apnea) Assessment & Plan: Diagnosed during recent preoperative evaluation. Sleep study pending. -Complete sleep study as scheduled.   Gastroesophageal reflux disease without esophagitis Assessment & Plan: Stable   Attention deficit disorder (ADD) without hyperactivity Assessment & Plan: Stable on Ritalin for the past two months. No adverse effects noted. -Continue Ritalin as prescribed.HTN: Monitored with diet.   Non-seasonal allergic rhinitis due to  pollen Assessment & Plan: Severe symptoms with nasal congestion and postnasal drip. Currently on Claritin and Montelukast. Previous trials of prescription nasal sprays were not effective or not covered by insurance. -Perform strep test due to red throat. -If strep test is negative, administer Kenalog injection. -Prescribe Flonase (steroid nasal spray) for additional symptom control.  Orders: -     Fluticasone Propionate; Place 2 sprays into both nostrils daily.  Dispense: 16 g; Refill: 11 -     Triamcinolone Acetonide  Acute streptococcal pharyngitis Assessment & Plan: Check for strep Kenalog 80 mg IM #1 was given  Orders: -     POCT rapid strep A  Mixed hyperlipidemia Assessment & Plan: Well controlled.  No changes to medicines. Crestor 10 mg daily Continue to work on eating a healthy diet and exercise.  Labs drawn today.     Vitamin D deficiency Assessment & Plan: The current medical regimen is effective;  continue present plan and medications.  Vitamin D 50k weekly    Other orders -     Amoxicillin; Take 1 tablet (875 mg total) by mouth 2 (two) times daily for 10 days.  Dispense: 20 tablet; Refill: 0    Meds ordered this encounter  Medications   lisinopril-hydrochlorothiazide (ZESTORETIC) 20-25 MG tablet    Sig: Take 1 tablet by mouth daily.    Dispense:  90 tablet    Refill:  3   fluticasone (FLONASE) 50 MCG/ACT nasal spray    Sig: Place 2 sprays into both nostrils daily.    Dispense:  16 g    Refill:  11   triamcinolone acetonide (KENALOG-40) injection 80 mg   amoxicillin (AMOXIL) 875 MG tablet    Sig: Take 1 tablet (875 mg total) by mouth 2 (two) times daily for 10 days.    Dispense:  20 tablet    Refill:  0    Orders Placed This Encounter  Procedures   CBC with Differential/Platelet   Comprehensive metabolic panel   Lipid panel   POCT rapid strep A     Follow-up: Return in about 3 months (around 11/04/2023) for chronic follow up.   I,Marla I  Leal-Borjas,acting as a scribe for Blane Ohara, MD.,have documented all relevant documentation on the behalf of Blane Ohara, MD,as directed by  Blane Ohara, MD while in the presence of Blane Ohara, MD.   An After Visit Summary was printed and given to the patient.  Marland Kitchenatttest

## 2023-08-06 NOTE — Patient Instructions (Signed)
VISIT SUMMARY:  During your visit today, we discussed your worsening allergy symptoms, management of your hyperlipidemia, and other ongoing health concerns. We also reviewed your recent cardiac imaging results and planned for your upcoming sleep study.  YOUR PLAN:  -ALLERGIC RHINITIS: Allergic rhinitis is an inflammation of the inside of the nose caused by an allergen, such as pollen, dust, mold, or flakes of skin from certain animals. To help manage your symptoms, we will perform a strep test due to your red throat. If the test is negative, we will administer a Kenalog injection to help reduce inflammation. Additionally, we are prescribing Flonase, a steroid nasal spray, to provide further symptom control.  -HYPERLIPIDEMIA: Hyperlipidemia is a condition where there are high levels of fats (lipids) in the blood, which can increase the risk of heart disease. You have been managing this with crestor  and recently increased your dose to 20mg . We will check your cholesterol levels today and continue with the 20mg  dose daily.  -SLEEP APNEA: Sleep apnea is a potentially serious sleep disorder in which breathing repeatedly stops and starts. You have a sleep study scheduled to evaluate this condition. Please complete the sleep study as planned.  -ADHD: Attention Deficit Hyperactivity Disorder (ADHD) is a mental health disorder that includes a combination of persistent problems, such as difficulty sustaining attention, hyperactivity, and impulsive behavior. You have been stable on Ritalin for the past two months with no adverse effects. Continue taking Ritalin as prescribed.  INSTRUCTIONS:  Please follow up with your cardiologist as scheduled. Complete your sleep study as planned. We will check your cholesterol levels today, and you should continue taking Atorvastatin 20mg  daily. If your strep test is negative, we will administer a Kenalog injection for your allergy symptoms.

## 2023-08-07 ENCOUNTER — Telehealth: Payer: Self-pay

## 2023-08-07 DIAGNOSIS — I10 Essential (primary) hypertension: Secondary | ICD-10-CM

## 2023-08-07 DIAGNOSIS — E782 Mixed hyperlipidemia: Secondary | ICD-10-CM

## 2023-08-07 LAB — LIPID PANEL
Chol/HDL Ratio: 3.6 {ratio} (ref 0.0–5.0)
Cholesterol, Total: 145 mg/dL (ref 100–199)
HDL: 40 mg/dL (ref >39)
LDL Chol Calc (NIH): 87 mg/dL (ref 0–99)
Triglycerides: 96 mg/dL (ref 0–149)
VLDL Cholesterol Cal: 18 mg/dL (ref 5–40)

## 2023-08-07 LAB — COMPREHENSIVE METABOLIC PANEL
ALT: 26 [IU]/L (ref 0–44)
AST: 24 [IU]/L (ref 0–40)
Albumin: 4.4 g/dL (ref 4.1–5.1)
Alkaline Phosphatase: 81 [IU]/L (ref 44–121)
BUN/Creatinine Ratio: 15 (ref 9–20)
BUN: 19 mg/dL (ref 6–24)
Bilirubin Total: 0.3 mg/dL (ref 0.0–1.2)
CO2: 24 mmol/L (ref 20–29)
Calcium: 9.2 mg/dL (ref 8.7–10.2)
Chloride: 104 mmol/L (ref 96–106)
Creatinine, Ser: 1.25 mg/dL (ref 0.76–1.27)
Globulin, Total: 2.4 g/dL (ref 1.5–4.5)
Glucose: 85 mg/dL (ref 70–99)
Potassium: 4.9 mmol/L (ref 3.5–5.2)
Sodium: 143 mmol/L (ref 134–144)
Total Protein: 6.8 g/dL (ref 6.0–8.5)
eGFR: 74 mL/min/{1.73_m2} (ref >59)

## 2023-08-07 LAB — CBC WITH DIFFERENTIAL/PLATELET
Basophils Absolute: 0 10*3/uL (ref 0.0–0.2)
Basos: 1 %
EOS (ABSOLUTE): 0.1 10*3/uL (ref 0.0–0.4)
Eos: 2 %
Hematocrit: 44.8 % (ref 37.5–51.0)
Hemoglobin: 14.2 g/dL (ref 13.0–17.7)
Immature Grans (Abs): 0 10*3/uL (ref 0.0–0.1)
Immature Granulocytes: 0 %
Lymphocytes Absolute: 2.8 10*3/uL (ref 0.7–3.1)
Lymphs: 42 %
MCH: 28.9 pg (ref 26.6–33.0)
MCHC: 31.7 g/dL (ref 31.5–35.7)
MCV: 91 fL (ref 79–97)
Monocytes Absolute: 0.7 10*3/uL (ref 0.1–0.9)
Monocytes: 11 %
Neutrophils Absolute: 3 10*3/uL (ref 1.4–7.0)
Neutrophils: 44 %
Platelets: 248 10*3/uL (ref 150–450)
RBC: 4.91 x10E6/uL (ref 4.14–5.80)
RDW: 12.9 % (ref 11.6–15.4)
WBC: 6.7 10*3/uL (ref 3.4–10.8)

## 2023-08-07 MED ORDER — ROSUVASTATIN CALCIUM 40 MG PO TABS: 40.0000 mg | ORAL_TABLET | Freq: Every day | ORAL | 1 refills | Status: DC

## 2023-08-07 NOTE — Telephone Encounter (Signed)
Patient notified of results and recommendations and agreed with plan, medication sent, lab order on file.   

## 2023-08-07 NOTE — Telephone Encounter (Signed)
-----   Message from Marlyn Corporal Madireddy sent at 07/31/2023 10:05 AM EST ----- Sent a note to him on MyChart. If he is agreeable, send prescription for higher strength of Crestor 40 mg once a day. He will need complete metabolic panel in about 1 month after taking the higher dose of Crestor. Thank you

## 2023-08-09 DIAGNOSIS — J028 Acute pharyngitis due to other specified organisms: Secondary | ICD-10-CM | POA: Insufficient documentation

## 2023-08-09 HISTORY — DX: Acute pharyngitis due to other specified organisms: J02.8

## 2023-08-09 NOTE — Assessment & Plan Note (Signed)
Well controlled.  No changes to medicines. Crestor 10 mg daily Continue to work on eating a healthy diet and exercise.  Labs drawn today.

## 2023-08-09 NOTE — Assessment & Plan Note (Signed)
Severe symptoms with nasal congestion and postnasal drip. Currently on Claritin and Montelukast. Previous trials of prescription nasal sprays were not effective or not covered by insurance. -Perform strep test due to red throat. -If strep test is negative, administer Kenalog injection. -Prescribe Flonase (steroid nasal spray) for additional symptom control.

## 2023-08-09 NOTE — Assessment & Plan Note (Signed)
Diagnosed during recent preoperative evaluation. Sleep study pending. -Complete sleep study as scheduled.

## 2023-08-09 NOTE — Assessment & Plan Note (Addendum)
Well controlled.  Continue with lisinopril-hydrochlorothiazide 20-25 mg daily. Continue to work on eating a healthy diet and exercise.  Labs drawn today.

## 2023-08-09 NOTE — Assessment & Plan Note (Signed)
Check for strep Kenalog 80 mg IM #1 was given

## 2023-08-09 NOTE — Assessment & Plan Note (Signed)
Stable on Ritalin for the past two months. No adverse effects noted. -Continue Ritalin as prescribed.HTN: Monitored with diet.

## 2023-08-09 NOTE — Assessment & Plan Note (Signed)
Stable

## 2023-08-09 NOTE — Assessment & Plan Note (Signed)
The current medical regimen is effective;  continue present plan and medications.  Vitamin D 50k weekly

## 2023-08-22 DIAGNOSIS — G4733 Obstructive sleep apnea (adult) (pediatric): Secondary | ICD-10-CM | POA: Diagnosis not present

## 2023-08-22 DIAGNOSIS — R5383 Other fatigue: Secondary | ICD-10-CM | POA: Diagnosis not present

## 2023-08-27 ENCOUNTER — Other Ambulatory Visit: Payer: Self-pay | Admitting: Family Medicine

## 2023-08-27 DIAGNOSIS — J301 Allergic rhinitis due to pollen: Secondary | ICD-10-CM

## 2023-08-27 MED ORDER — MONTELUKAST SODIUM 10 MG PO TABS: 10.0000 mg | ORAL_TABLET | Freq: Every day | ORAL | 1 refills | Status: DC

## 2023-08-27 NOTE — Telephone Encounter (Signed)
 Copied from CRM [id number]. Topic: Clinical - Medication Refill >> Aug 27, 2023 12:32 PM Elle L wrote: Most Recent Primary Care Visit:  Provider: [name]  Department: [name]  Visit Type: OFFICE VISIT  Date: 08/06/2023  Medication: montelukast  (SINGULAIR ) 10 MG tablet   Has the patient contacted their pharmacy? Yes  Is this the correct pharmacy for this prescription? Yes This is the patient's preferred pharmacy:  [facility] - [place], [place] - [address] [place] [address] Phone: [phone] Fax: [phone]   Has the prescription been filled recently? Yes  Is the patient out of the medication? Yes  Has the patient been seen for an appointment in the last year OR does the patient have an upcoming appointment? Yes  Can we respond through MyChart? Yes  Agent: Please be advised that Rx refills may take up to 3 business days. We ask that you follow-up with your pharmacy.

## 2023-08-28 ENCOUNTER — Other Ambulatory Visit: Payer: Self-pay | Admitting: Family Medicine

## 2023-08-28 DIAGNOSIS — J4520 Mild intermittent asthma, uncomplicated: Secondary | ICD-10-CM | POA: Diagnosis not present

## 2023-08-28 DIAGNOSIS — G4761 Periodic limb movement disorder: Secondary | ICD-10-CM | POA: Diagnosis not present

## 2023-08-28 DIAGNOSIS — F988 Other specified behavioral and emotional disorders with onset usually occurring in childhood and adolescence: Secondary | ICD-10-CM

## 2023-08-28 DIAGNOSIS — G4733 Obstructive sleep apnea (adult) (pediatric): Secondary | ICD-10-CM | POA: Diagnosis not present

## 2023-08-28 DIAGNOSIS — R400 Somnolence: Secondary | ICD-10-CM | POA: Diagnosis not present

## 2023-09-06 DIAGNOSIS — G4733 Obstructive sleep apnea (adult) (pediatric): Secondary | ICD-10-CM | POA: Diagnosis not present

## 2023-09-06 DIAGNOSIS — R5383 Other fatigue: Secondary | ICD-10-CM | POA: Diagnosis not present

## 2023-09-08 ENCOUNTER — Ambulatory Visit: Payer: BC Managed Care – PPO

## 2023-09-08 VITALS — BP 134/92 | HR 89 | Ht 70.0 in | Wt 263.4 lb

## 2023-09-08 DIAGNOSIS — I2510 Atherosclerotic heart disease of native coronary artery without angina pectoris: Secondary | ICD-10-CM | POA: Diagnosis not present

## 2023-09-08 DIAGNOSIS — I10 Essential (primary) hypertension: Secondary | ICD-10-CM | POA: Diagnosis not present

## 2023-09-08 DIAGNOSIS — E782 Mixed hyperlipidemia: Secondary | ICD-10-CM

## 2023-09-08 NOTE — Assessment & Plan Note (Signed)
 Review of findings from CT coronary angiogram once again with regards to mild coronary atherosclerotic burden. Risk reduction with statin therapy as above reviewed.

## 2023-09-08 NOTE — Assessment & Plan Note (Signed)
 Improved lipid panel on August 06, 2023 in comparison to prior. Cholesterol 145, HDL 40, LDL 87, triglycerides 96.  Discussed the role of statins and lowering progression of coronary atherosclerosis and reducing cardiovascular outcomes.  In about 1 year if he is doing excellent with his overall cardiovascular risk factor reduction, diet modification, weight control and exercise, we can review based on repeat lipid panel down titration of the statin dose. He is agreeable to this. Also if he does start having any side effects or abnormal liver functions while on statins will discontinue statins.   At this time Continue Crestor  40 mg once daily. Will repeat fasting lipid panel and CMP in 4 to 6 weeks for interval assessment.

## 2023-09-08 NOTE — Patient Instructions (Signed)
 Medication Instructions:  Your physician recommends that you continue on your current medications as directed. Please refer to the Current Medication list given to you today.  *If you need a refill on your cardiac medications before your next appointment, please call your pharmacy*   Lab Work: Your physician recommends that you return for lab work in: 6 weeks    You need to have labs done when you are fasting.  You can come Monday through Friday 8:30 am to 12:00 pm and 1:15 to 4:30. You do not need to make an appointment as the order has already been placed. The labs you are going to have done are CMP, and fasting lipids.  If you have labs (blood work) drawn today and your tests are completely normal, you will receive your results only by: MyChart Message (if you have MyChart) OR A paper copy in the mail If you have any lab test that is abnormal or we need to change your treatment, we will call you to review the results.   Testing/Procedures: None Ordered   Follow-Up: At [facility], you and your health needs are our priority.  As part of our continuing mission to provide you with exceptional heart care, we have created designated Provider Care Teams.  These Care Teams include your primary Cardiologist (physician) and Advanced Practice Providers (APPs -  Physician Assistants and Nurse Practitioners) who all work together to provide you with the care you need, when you need it.  We recommend signing up for the patient portal called MyChart.  Sign up information is provided on this After Visit Summary.  MyChart is used to connect with patients for Virtual Visits (Telemedicine).  Patients are able to view lab/test results, encounter notes, upcoming appointments, etc.  Non-urgent messages can be sent to your provider as well.   To learn more about what you can do with MyChart, go to [email or web address].    Your next appointment:   1 year

## 2023-09-08 NOTE — Progress Notes (Signed)
 Cardiology Consultation:    Date:  09/08/2023   ID:  Brett Roberts, DOB October 21, 1980, MRN [id number]  PCP:  Brett Clapper, MD  Cardiologist:  Brett SAUNDERS Judas Mohammad, MD   Referring MD: Brett Clapper, MD   No chief complaint on file.    ASSESSMENT AND PLAN:   Mr. Brett Roberts 43 year old with minimal coronary atherosclerosis calcium  score 11.3 on CT coronary angiogram 07/22/2023 and CAD RADS 1 study with less than 25% stenosis in mid LAD and proximal RCA, hypertension, obesity, anxiety, stress at work, obstructive sleep apnea, right middle finger tenosynovitis, hyperlipidemia, GERD, normal biventricular function without any significant valve abnormalities on echocardiogram December 2024 is here for follow-up visit.  Problem List Items Addressed This Visit     Essential hypertension   Suboptimal blood pressure control at this time.  Recommended keeping a log of blood pressure readings at home and send us  a log in about 7 to 10 days.  If blood pressure persistently above 130/80 mmHg, can titrate up his current antihypertensive medication dose.  Advised him to avoid taking daily and excess dose of ibuprofen .      Mixed hyperlipidemia - Primary   Improved lipid panel on August 06, 2023 in comparison to prior. Cholesterol 145, HDL 40, LDL 87, triglycerides 96.  Discussed the role of statins and lowering progression of coronary atherosclerosis and reducing cardiovascular outcomes.  In about 1 year if he is doing excellent with his overall cardiovascular risk factor reduction, diet modification, weight control and exercise, we can review based on repeat lipid panel down titration of the statin dose. He is agreeable to this. Also if he does start having any side effects or abnormal liver functions while on statins will discontinue statins.   At this time Continue Crestor  40 mg once daily. Will repeat fasting lipid panel and CMP in 4 to 6 weeks for interval assessment.        Relevant  Orders   Lipid panel   Comprehensive metabolic panel   Coronary atherosclerosis, calcium  score 11.3, CAD RADS 1, less than 25% stenosis of LAD and RCA   Review of findings from CT coronary angiogram once again with regards to mild coronary atherosclerotic burden. Risk reduction with statin therapy as above reviewed.      Relevant Orders   Lipid panel   Comprehensive metabolic panel   Return to clinic in 1 year or earlier as needed.   History of Present Illness:    Brett Roberts is a 43 y.o. male who is being seen today for follow-up visit. Last visit with us  in the office was 07/09/2023. PCP is [name], [name], MD.  Here for the visit accompanied by his kid.  Lives at home with his wife and 2 kids and works  at 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  Has history of hypertension, right middle finger tenosynovitis, hyperlipidemia, GERD, ADD, obesity, anxiety and stress at work, obstructive sleep apnea in the process of obtaining CPAP which has been ordered, was seen for cardiology consult after initial evaluation at the ER at [facility] for chest pain lasting for 45 minutes and unremarkable troponins and discharged home to follow-up as outpatient.  Echocardiogram and CT coronary angiogram are recommended. Echocardiogram done 07/30/2023 noted normal biventricular function LVEF 60 to 65%, no significant valve abnormalities.  CT coronary angiogram from 07/22/2023 reported calcium  score minimally elevated  11.3, mild nonobstructive coronary artery disease with less than 25% stenosis in mid LAD and proximal RCA.  No significant extracardiac findings noted..  Last lipid panel from 08/06/2023 total cholesterol 145, HDL 40, LDL 87, triglycerides 96.  This is an improvement compared to his previous lipid panel available to review from 04/28/2023 total cholesterol  212, LDL 145, HDL 48, triglycerides 104.  Today he mentions doing well. No cardiac symptoms. Has been tolerating statin, Crestor  and dose was recently increased to 40 mg once daily about a week ago. He is wondering about potential side effects and being able to cut down the dose in future.  Use ibuprofen  occasionally 200 mg per dose up to 3 times a week to help with pain control after his recent right middle finger surgery for tenosynovitis.   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   Atypical chest pain 01/07/2021   Chest pain 01/07/2021   Class 2 severe obesity due to excess calories with serious comorbidity and body mass index (BMI) of 36.0 to 36.9 in adult [facility]) 10/18/2022   Coronary atherosclerosis, calcium  score 11.3, CAD RADS 1, less than 25% stenosis of LAD and RCA 07/31/2023   Essential hypertension 01/07/2021   Family history of premature CAD 01/07/2021   Gastroesophageal reflux disease without esophagitis 07/03/2023   Headache above the eye region 05/13/2023   Hypertension    Mixed hyperlipidemia 10/18/2022   Morbid obesity (HCC) 05/02/2023   OSA (obstructive sleep apnea) 01/07/2021   Trigger middle finger 11/19/2020   Vitamin D  deficiency 05/02/2023    Past Surgical History:  Procedure Laterality Date   TRIGGER FINGER RELEASE Right 07/01/2023   Procedure: RELEASE MIDDLE TRIGGER FINGER/A-1 PULLEY;  Surgeon: Romona Harari, MD;  Location: [facility];  Service: Orthopedics;  Laterality: Right;  local 30    Current Medications: Current Meds  Medication Sig   azelastine  (ASTELIN ) 0.1 % nasal spray Place 1 spray into both nostrils 2 (two) times daily. Use in each nostril as directed  (Patient taking differently: Place 1 spray into both nostrils as needed for rhinitis or allergies.)   BREO ELLIPTA 100-25 MCG/ACT AEPB Inhale 1 puff into the lungs daily.   fluticasone  (FLONASE ) 50 MCG/ACT nasal spray Place 2 sprays into both nostrils daily.   ibuprofen  (ADVIL ) 200 MG tablet Take 200 mg by mouth every 6 (six) hours as needed for headache, mild pain (pain score 1-3) or moderate pain (pain score 4-6).   lisinopril -hydrochlorothiazide  (ZESTORETIC ) 20-25 MG tablet Take 1 tablet by mouth daily.   METHYLPHENIDATE  18 MG PO CR tablet Take 1 tablet (18 mg total) by mouth daily.   montelukast  (SINGULAIR ) 10 MG tablet Take 1 tablet (10 mg total) by mouth at bedtime.   rosuvastatin  (CRESTOR ) 40 MG tablet Take 1 tablet (40 mg total) by mouth daily.     Allergies:   Patient has no known allergies.   Social History   Socioeconomic History   Marital status: Married    Spouse name: Not on file   Number of children: Not on file   Years of education: Not on file   Highest education level: Not on file  Occupational History   Not on file  Tobacco Use   Smoking status: Never  Smokeless tobacco: Never  Vaping Use   Vaping status: Never Used  Substance and Sexual Activity   Alcohol use: Yes    Alcohol/week: 1.0 - 2.0 standard drink of alcohol    Types: 1 - 2 Cans of beer per week    Comment: twice weekly    Drug use: Never   Sexual activity: Yes  Other Topics Concern   Not on file  Social History Narrative   Not on file   Social Drivers of Health   Financial Resource Strain: Low Risk  (01/21/2023)   Overall Financial Resource Strain (CARDIA)    Difficulty of Paying Living Expenses: Not hard at all  Food Insecurity: No Food Insecurity (01/21/2023)   Hunger Vital Sign    Worried About Running Out of Food in the Last Year: Never true    Ran Out of Food in the Last Year: Never true  Transportation Needs: No Transportation Needs (01/21/2023)   PRAPARE - Scientist, Research (physical Sciences) (Medical): No    Lack of Transportation (Non-Medical): No  Physical Activity: Insufficiently Active (01/21/2023)   Exercise Vital Sign    Days of Exercise per Week: 2 days    Minutes of Exercise per Session: 60 min  Stress: No Stress Concern Present (01/21/2023)   [employer] of Occupational Health - Occupational Stress Questionnaire    Feeling of Stress : Not at all  Social Connections: Moderately Isolated (01/21/2023)   Social Connection and Isolation Panel [NHANES]    Frequency of Communication with Friends and Family: Three times a week    Frequency of Social Gatherings with Friends and Family: Three times a week    Attends Religious Services: Never    Active Member of Clubs or Organizations: No    Attends Banker Meetings: Never    Marital Status: Married     Family History: The patient's family history includes Allergic rhinitis in an other family member; Angioedema in an other family member; Asthma in an other family member; Atopy in an other family member; Eczema in an other family member; Heart attack in his father; Hyperlipidemia in his brother; Hypertension in his mother and sister; Immunodeficiency in an other family member; Urticaria in an other family member. ROS:   Please see the history of present illness.    All 14 point review of systems negative except as described per history of present illness.  EKGs/Labs/Other Studies Reviewed:    The following studies were reviewed today:   EKG:       Recent Labs: 04/28/2023: TSH 1.710 08/06/2023: ALT 26; BUN 19; Creatinine, Ser 1.25; Hemoglobin 14.2; Platelets 248; Potassium 4.9; Sodium 143  Recent Lipid Panel    Component Value Date/Time   CHOL 145 08/06/2023 1454   TRIG 96 08/06/2023 1454   HDL 40 08/06/2023 1454   CHOLHDL 3.6 08/06/2023 1454   LDLCALC 87 08/06/2023 1454    Physical Exam:    VS:  BP (!) 134/92   Pulse 89   Ht 5' 10 (1.778 m)   Wt 263 lb 6.4 oz (119.5 kg)   SpO2  95%   BMI 37.79 kg/m     Wt Readings from Last 3 Encounters:  09/08/23 263 lb 6.4 oz (119.5 kg)  08/06/23 268 lb (121.6 kg)  07/09/23 262 lb 6.4 oz (119 kg)     GENERAL:  Well nourished, well developed in no acute distress CARDIAC: RRR, S1 and S2 present, no murmurs, no rubs, no gallops NEUROLOGIC:  Alert and oriented x 3  Medication Adjustments/Labs and Tests Ordered: Current medicines are reviewed at length with the patient today.  Concerns regarding medicines are outlined above.  Orders Placed This Encounter  Procedures   Lipid panel   Comprehensive metabolic panel   No orders of the defined types were placed in this encounter.   Signed, Brett jess Kobus, MD, MPH, [facility]. 09/08/2023 4:59 PM    [facility]

## 2023-09-08 NOTE — Assessment & Plan Note (Addendum)
 Suboptimal blood pressure control at this time.  Recommended keeping a log of blood pressure readings at home and send us  a log in about 7 to 10 days.  If blood pressure persistently above 130/80 mmHg, can titrate up his current antihypertensive medication dose.  Advised him to avoid taking daily and excess dose of ibuprofen .

## 2023-09-18 DIAGNOSIS — R400 Somnolence: Secondary | ICD-10-CM | POA: Diagnosis not present

## 2023-09-18 DIAGNOSIS — G4733 Obstructive sleep apnea (adult) (pediatric): Secondary | ICD-10-CM | POA: Diagnosis not present

## 2023-09-18 DIAGNOSIS — J4520 Mild intermittent asthma, uncomplicated: Secondary | ICD-10-CM | POA: Diagnosis not present

## 2023-09-18 DIAGNOSIS — G4761 Periodic limb movement disorder: Secondary | ICD-10-CM | POA: Diagnosis not present

## 2023-09-28 ENCOUNTER — Other Ambulatory Visit: Payer: Self-pay

## 2023-09-28 DIAGNOSIS — F988 Other specified behavioral and emotional disorders with onset usually occurring in childhood and adolescence: Secondary | ICD-10-CM

## 2023-10-23 ENCOUNTER — Ambulatory Visit (HOSPITAL_BASED_OUTPATIENT_CLINIC_OR_DEPARTMENT_OTHER)
Admission: RE | Admit: 2023-10-23 | Discharge: 2023-10-23 | Disposition: A | Discharge status: Home / Self Care | Payer: BC Managed Care – PPO | Source: Ambulatory Visit | Attending: Family Medicine | Admitting: Family Medicine

## 2023-10-23 ENCOUNTER — Ambulatory Visit: Payer: Self-pay | Admitting: Family Medicine

## 2023-10-23 ENCOUNTER — Encounter (HOSPITAL_BASED_OUTPATIENT_CLINIC_OR_DEPARTMENT_OTHER): Payer: Self-pay

## 2023-10-23 VITALS — BP 105/79 | HR 95 | Temp 98.3°F | Resp 20

## 2023-10-23 DIAGNOSIS — J029 Acute pharyngitis, unspecified: Secondary | ICD-10-CM

## 2023-10-23 DIAGNOSIS — J069 Acute upper respiratory infection, unspecified: Secondary | ICD-10-CM | POA: Diagnosis not present

## 2023-10-23 DIAGNOSIS — G4733 Obstructive sleep apnea (adult) (pediatric): Secondary | ICD-10-CM | POA: Diagnosis not present

## 2023-10-23 HISTORY — DX: Other seasonal allergic rhinitis: J30.2

## 2023-10-23 MED ORDER — PROMETHAZINE-DM 6.25-15 MG/5ML PO SYRP
5.0000 mL | ORAL_SOLUTION | Freq: Four times a day (QID) | ORAL | 0 refills | Status: DC | PRN
Start: 2023-10-23 — End: 2023-11-05

## 2023-10-23 NOTE — Telephone Encounter (Signed)
 Summary: sore throat which is swelling,   Copied From CRM [id number]. Reason for Triage: Patient called in, had to stop allergy medication, due to allergy test on Wednesday. Patient has sore throat which is swelling, cough, sinus drainage and ear pressure.  [id number]   provider is Blane Ohara         Chief Complaint: sore throat Symptoms: red & inflamed throat, cough due to drainage, ear pressure,  throat pain 8/10, runny & stuffy nose Frequency: yesterday Pertinent Negatives: Patient denies chest pain, pus in throat Disposition: [] ED /[x] Urgent Care (no appt availability in office) / [] Appointment(In office/virtual)/ []  [place] [facility]/ [] Home Care/ [] Refused Recommended Disposition /[] [place] [facility]/ []  Follow-up with PCP Additional Notes: pt reports this happens about 6-8 times per year & would like to be swabbed for strep.  Pt states sinus issues are getting worse every year and that is why he will be having allergies checked this coming up Wednesday.  No appt available with PCP or other providers in office: scheduled pt  urgent care in person visit today.  Reason for Disposition . SEVERE (e.g., excruciating) throat pain  Answer Assessment - Initial Assessment Questions 1. ONSET: "When did the throat start hurting?" (Hours or days ago)      yesterday 2. SEVERITY: "How bad is the sore throat?" (Scale 1-10; mild, moderate or severe)   - MILD (1-3):  Doesn't interfere with eating or normal activities.   - MODERATE (4-7): Interferes with eating some solids and normal activities.   - SEVERE (8-10):  Excruciating pain, interferes with most normal activities.   - SEVERE WITH DYSPHAGIA (10): Can't swallow liquids, drooling.     Severe 8/10 3. STREP EXPOSURE: "Has there been any exposure to strep within the past week?" If Yes, ask: "What type of contact occurred?"      no 4.  VIRAL SYMPTOMS: "Are there any symptoms of a cold, such as a runny nose, cough, hoarse  voice or red eyes?"      Runny nose & stuffy nose, hoarse, drainage in throat, cough  5. FEVER: "Do you have a fever?" If Yes, ask: "What is your temperature, how was it measured, and when did it start?"     no 6. PUS ON THE TONSILS: "Is there pus on the tonsils in the back of your throat?"     no 7. OTHER SYMPTOMS: "Do you have any other symptoms?" (e.g., difficulty breathing, headache, rash)     Inflamed, redness to throat, hoarseness 8. PREGNANCY: "Is there any chance you are pregnant?" "When was your last menstrual period?"     N/a  Protocols used: Sore Throat-A-AH

## 2023-10-23 NOTE — ED Triage Notes (Signed)
 Runny nose and sore throat onset yesterday. States has stopped taking his allergy medicine due to an allergy test coming up. Reports +post nasal drip. + seasonal allergies

## 2023-10-23 NOTE — ED Provider Notes (Signed)
  [facility]   [id number] 10/23/23 Arrival Time: 1259  ASSESSMENT & PLAN:  1. Viral URI with cough   2. Sore throat    Discussed typical duration of likely viral illness +/- allergies. OTC symptom care as needed.  Meds ordered this encounter  Medications   promethazine-dextromethorphan (PROMETHAZINE-DM) 6.25-15 MG/5ML syrup    Sig: Take 5 mLs by mouth 4 (four) times daily as needed for cough.    Dispense:  118 mL    Refill:  0     Follow-up Information     Practice[name]   Why: As needed. Contact information: [address] [place] [address] [phone]                 Reviewed expectations re: course of current medical issues. Questions answered. Outlined signs and symptoms indicating need for more acute intervention. Understanding verbalized. After Visit Summary given.   SUBJECTIVE: History from: Patient. Brett Roberts is a 43 y.o. male. Runny nose and sore throat onset yesterday. States has stopped taking his allergy medicine due to an allergy test coming up. Reports +post nasal drip. + seasonal allergies Ques subj fever yesterday evening. Normal PO intake without n/v/d.  OBJECTIVE:  Vitals:   10/23/23 1322  BP: 105/79  Pulse: 95  Resp: 20  Temp: 98.3 F (36.8 C)  TempSrc: Oral  SpO2: 95%    General appearance: alert; no distress Eyes: PERRLA; EOMI; conjunctiva normal HENT: [place]; AT; with nasal congestion; throat with cobblestoning Neck: supple  Lungs: speaks full sentences without difficulty; unlabored Extremities: no edema Skin: warm and dry Neurologic: normal gait Psychological: alert and cooperative; normal mood and affect   No Known Allergies  Past Medical History:  Diagnosis Date   Seasonal allergies    Social History   Socioeconomic History   Marital status: Married    Spouse name: Not on file   Number of children: Not on file   Years of education: Not on file   Highest education level: Not on  file  Occupational History   Not on file  Tobacco Use   Smoking status: Not on file   Smokeless tobacco: Not on file  Substance and Sexual Activity   Alcohol use: Not on file   Drug use: Not on file   Sexual activity: Not on file  Other Topics Concern   Not on file  Social History Narrative   Not on file   Social Drivers of Health   Financial Resource Strain: Not on file  Food Insecurity: Not on file  Transportation Needs: Not on file  Physical Activity: Not on file  Stress: Not on file  Social Connections: Not on file  Intimate Partner Violence: Not on file   No family history on file.    Mardella Layman, MD 10/23/23 [phone]

## 2023-10-25 ENCOUNTER — Encounter: Payer: Self-pay | Admitting: Family Medicine

## 2023-10-26 DIAGNOSIS — M65331 Trigger finger, right middle finger: Secondary | ICD-10-CM | POA: Diagnosis not present

## 2023-11-05 ENCOUNTER — Encounter: Payer: Self-pay | Admitting: Family Medicine

## 2023-11-05 ENCOUNTER — Ambulatory Visit: Admitting: Family Medicine

## 2023-11-05 ENCOUNTER — Ambulatory Visit: Payer: Self-pay

## 2023-11-05 VITALS — BP 110/72 | HR 89 | Temp 97.6°F | Ht 70.0 in | Wt 261.0 lb

## 2023-11-05 DIAGNOSIS — J208 Acute bronchitis due to other specified organisms: Secondary | ICD-10-CM

## 2023-11-05 DIAGNOSIS — J301 Allergic rhinitis due to pollen: Secondary | ICD-10-CM | POA: Diagnosis not present

## 2023-11-05 HISTORY — DX: Acute bronchitis due to other specified organisms: J20.8

## 2023-11-05 MED ORDER — GUAIFENESIN-CODEINE 100-10 MG/5ML PO SOLN: 5.0000 mL | Freq: Three times a day (TID) | ORAL | 0 refills | Status: DC | PRN

## 2023-11-05 MED ORDER — TRIAMCINOLONE ACETONIDE 40 MG/ML IJ SUSP
80.0000 mg | Freq: Once | INTRAMUSCULAR | Status: AC
  Administered 2023-11-05: 80 mg via INTRAMUSCULAR

## 2023-11-05 NOTE — Progress Notes (Signed)
 Acute Office Visit  Subjective:    Patient ID: Brett Roberts, male    DOB: 10-07-1980, 43 y.o.   MRN: [id number]  Chief Complaint  Patient presents with   Cough    Chest congestion    Discussed the use of AI scribe software for clinical note transcription with the patient, who gave verbal consent to proceed.   HPI: The patient presents with persistent cough and hemoptysis following a flu infection.  He has been experiencing a persistent cough since a flu infection approximately two weeks ago, confirmed by a test from a pharmacy at home test. The cough is severe, causing prolonged episodes, especially at work. In the last two mornings, he has noticed hemoptysis, which he attributes to possibly bursting blood vessels due to the intensity of the coughing. No significant shortness of breath, but he notes difficulty taking deep breaths and feeling deconditioned when walking short distances. Various over-the-counter medications, including cough medicine and Sudafed, have not provided significant relief.  He was diagnosed with the flu two weeks ago, initially misdiagnosed as a cold. He did not receive antiviral treatment due to the initial misdiagnosis and potential side effects. The flu has resolved, but lingering symptoms include a persistent cough and hemoptysis. No current fever, but he had one during the initial infection.  He has a history of asthma-like symptoms and allergies, for which he takes montelukast and Claritin, and occasionally uses Flonase and Astelin. He was given a trial of Breo in January, which was ineffective. He reports sinus pressure and mild ear discomfort but no significant ear pain. Saline spray is used to keep nasal passages moist, and warm teas are consumed to soothe his throat.  He recently started using a CPAP machine for sleep apnea. He mentions that the CPAP use may contribute to throat dryness and irritation, potentially causing the hemoptysis.  He completed a  course of prednisone for hand swelling related to a trigger finger surgery, but it did not alleviate his symptoms. He has not been diagnosed with pneumonia in the past and is unsure if he has ever had it.  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   Atypical chest pain 01/07/2021   Chest pain 01/07/2021   Class 2 severe obesity due to excess calories with serious comorbidity and body mass index (BMI) of 36.0 to 36.9 in adult [facility]) 10/18/2022   Coronary atherosclerosis, calcium score 11.3, CAD RADS 1, less than 25% stenosis of LAD and RCA 07/31/2023   Essential hypertension 01/07/2021   Family history of premature CAD 01/07/2021   Gastroesophageal reflux disease without esophagitis 07/03/2023   Headache above the eye region 05/13/2023   Hypertension    Mixed hyperlipidemia 10/18/2022   Morbid obesity (HCC) 05/02/2023   OSA (obstructive sleep apnea) 01/07/2021   Seasonal allergies    Trigger middle finger 11/19/2020   Vitamin D deficiency 05/02/2023    Past Surgical History:  Procedure Laterality Date   TRIGGER FINGER RELEASE Right 07/01/2023   Procedure: RELEASE MIDDLE TRIGGER FINGER/A-1 PULLEY;  Surgeon: Marlyne Beards, MD;  Location: [facility];  Service: Orthopedics;  Laterality: Right;  local 30    Family History  Problem Relation Age of Onset   Hypertension Mother  Heart attack Father    Hypertension Sister    Hyperlipidemia Brother    Atopy Other    Eczema Other    Urticaria Other    Immunodeficiency Other    Asthma Other    Angioedema Other    Allergic rhinitis Other     Social History   Socioeconomic History   Marital status: Married    Spouse name: Not on file   Number of  children: Not on file   Years of education: Not on file   Highest education level: Not on file  Occupational History   Not on file  Tobacco Use   Smoking status: Never   Smokeless tobacco: Never  Vaping Use   Vaping status: Never Used  Substance and Sexual Activity   Alcohol use: Yes    Alcohol/week: 1.0 - 2.0 standard drink of alcohol    Types: 1 - 2 Cans of beer per week    Comment: twice weekly    Drug use: Never   Sexual activity: Yes  Other Topics Concern   Not on file  Social History Narrative   ** Merged History Encounter **       Social Drivers of Health   Financial Resource Strain: Low Risk  (01/21/2023)   Overall Financial Resource Strain (CARDIA)    Difficulty of Paying Living Expenses: Not hard at all  Food Insecurity: No Food Insecurity (01/21/2023)   Hunger Vital Sign    Worried About Running Out of Food in the Last Year: Never true    Ran Out of Food in the Last Year: Never true  Transportation Needs: No Transportation Needs (01/21/2023)   PRAPARE - Administrator, Civil Service (Medical): No    Lack of Transportation (Non-Medical): No  Physical Activity: Insufficiently Active (01/21/2023)   Exercise Vital Sign    Days of Exercise per Week: 2 days    Minutes of Exercise per Session: 60 min  Stress: No Stress Concern Present (01/21/2023)   [employer] of Occupational Health - Occupational Stress Questionnaire    Feeling of Stress : Not at all  Social Connections: Moderately Isolated (01/21/2023)   Social Connection and Isolation Panel [NHANES]    Frequency of Communication with Friends and Family: Three times a week    Frequency of Social Gatherings with Friends and Family: Three times a week    Attends Religious Services: Never    Active Member of Clubs or Organizations: No    Attends Banker Meetings: Never    Marital Status: Married  Catering manager Violence: Not At Risk (01/21/2023)   Humiliation, Afraid, Rape, and Kick  questionnaire    Fear of Current or Ex-Partner: No    Emotionally Abused: No    Physically Abused: No    Sexually Abused: No    Outpatient Medications Prior to Visit  Medication Sig Dispense Refill   azelastine (ASTELIN) 0.1 % nasal spray Place 1 spray into both nostrils 2 (two) times daily. Use in each nostril as directed (Patient taking differently: Place 1 spray into both nostrils as needed for rhinitis or allergies.) 30 mL 12   fluticasone (FLONASE) 50 MCG/ACT nasal spray Place 2 sprays into both nostrils daily. 16 g 11   ibuprofen (ADVIL) 200 MG tablet Take 200 mg by mouth every 6 (six) hours as needed for headache, mild pain (pain score 1-3) or moderate pain (pain score 4-6).     lisinopril-hydrochlorothiazide (ZESTORETIC) 20-25 MG tablet Take 1 tablet by mouth daily.  90 tablet 3   loratadine (CLARITIN) 10 MG tablet Take 10 mg by mouth daily.     METHYLPHENIDATE 18 MG PO CR tablet Take 1 tablet (18 mg total) by mouth daily. 30 tablet 0   montelukast (SINGULAIR) 10 MG tablet Take 1 tablet (10 mg total) by mouth at bedtime. 90 tablet 1   montelukast (SINGULAIR) 10 MG tablet Take 10 mg by mouth at bedtime.     rosuvastatin (CRESTOR) 40 MG tablet Take 1 tablet (40 mg total) by mouth daily. 30 tablet 1   Vitamin D, Ergocalciferol, (DRISDOL) 1.25 MG (50000 UNIT) CAPS capsule Take 1 capsule (50,000 Units total) by mouth every 7 (seven) days. 12 capsule 2   BREO ELLIPTA 100-25 MCG/ACT AEPB Inhale 1 puff into the lungs daily.     promethazine-dextromethorphan (PROMETHAZINE-DM) 6.25-15 MG/5ML syrup Take 5 mLs by mouth 4 (four) times daily as needed for cough. 118 mL 0   No facility-administered medications prior to visit.    No Known Allergies  Review of Systems  Constitutional:  Negative for appetite change, fatigue and fever.  HENT:  Positive for congestion. Negative for ear pain, sinus pressure and sore throat.   Respiratory:  Positive for cough and shortness of breath. Negative for  chest tightness and wheezing.   Cardiovascular:  Negative for chest pain and palpitations.  Gastrointestinal:  Negative for abdominal pain, constipation, diarrhea, nausea and vomiting.  Genitourinary:  Negative for dysuria and hematuria.  Musculoskeletal:  Negative for arthralgias, back pain, joint swelling and myalgias.  Skin:  Negative for rash.  Neurological:  Negative for dizziness, weakness and headaches.  Psychiatric/Behavioral:  Negative for dysphoric mood. The patient is not nervous/anxious.       Objective:        11/05/2023    1:22 PM 10/23/2023    1:22 PM 09/08/2023    4:09 PM  Vitals with BMI  Height 5\' 10"   5\' 10"   Weight 261 lbs  263 lbs 6 oz  BMI 37.45  37.79  Systolic 110 105 244  Diastolic 72 79 92  Pulse 89 95 89    Orthostatic VS for the past 72 hrs (Last 3 readings):  Patient Position BP Location  11/05/23 1322 Sitting Right Arm     Physical Exam Vitals reviewed.  Constitutional:      General: He is not in acute distress.    Appearance: Normal appearance. He is ill-appearing.  HENT:     Right Ear: Tympanic membrane normal.     Left Ear: Tympanic membrane normal.     Mouth/Throat:     Pharynx: Posterior oropharyngeal erythema present.  Eyes:     Conjunctiva/sclera: Conjunctivae normal.  Cardiovascular:     Rate and Rhythm: Normal rate and regular rhythm.     Heart sounds: Normal heart sounds. No murmur heard. Pulmonary:     Effort: Pulmonary effort is normal.     Breath sounds: Examination of the right-lower field reveals decreased breath sounds. Examination of the left-lower field reveals decreased breath sounds. Decreased breath sounds present. No wheezing, rhonchi or rales.  Musculoskeletal:        General: Normal range of motion.     Cervical back: Normal range of motion.  Skin:    General: Skin is warm.  Neurological:     Mental Status: He is alert. Mental status is at baseline.  Psychiatric:        Mood and Affect: Mood normal.         Behavior:  Behavior normal.     Health Maintenance Due  Topic Date Due   Pneumococcal Vaccine 25-72 Years old (1 of 2 - PCV) Never done    There are no preventive care reminders to display for this patient.   Lab Results  Component Value Date   TSH 1.710 04/28/2023   Lab Results  Component Value Date   WBC 6.7 08/06/2023   HGB 14.2 08/06/2023   HCT 44.8 08/06/2023   MCV 91 08/06/2023   PLT 248 08/06/2023   Lab Results  Component Value Date   NA 143 08/06/2023   K 4.9 08/06/2023   CO2 24 08/06/2023   GLUCOSE 85 08/06/2023   BUN 19 08/06/2023   CREATININE 1.25 08/06/2023   BILITOT 0.3 08/06/2023   ALKPHOS 81 08/06/2023   AST 24 08/06/2023   ALT 26 08/06/2023   PROT 6.8 08/06/2023   ALBUMIN 4.4 08/06/2023   CALCIUM 9.2 08/06/2023   ANIONGAP 7 04/21/2020   EGFR 74 08/06/2023   Lab Results  Component Value Date   CHOL 145 08/06/2023   Lab Results  Component Value Date   HDL 40 08/06/2023   Lab Results  Component Value Date   LDLCALC 87 08/06/2023   Lab Results  Component Value Date   TRIG 96 08/06/2023   Lab Results  Component Value Date   CHOLHDL 3.6 08/06/2023   Lab Results  Component Value Date   HGBA1C 5.6 01/30/2022       Assessment & Plan:  Acute bronchitis due to other specified organisms Assessment & Plan: Acute Persistent cough with hemoptysis post-flu, likely bronchitis. Discussed RSV as a potential cause. Steroids to reduce inflammation. Advised saline spray for nasal moisture. Symptom management focus unless worsening. - Administer Kenalog 80 mg injection. - Prescribe guaifenesin with codeine for nighttime use, cautioning against work due to drowsiness. - Use saline spray to moisten nasal passages. - Keep throat coated with lifesavers or similar. - Use honey and lemon as a natural remedy. - Order chest x-ray if shortness of breath or symptoms worsen. - Discuss steroid side effects: increased hunger, irritability, elevated blood  sugar. - Practice good hand hygiene and change toothbrush to prevent reinfection.  Orders: -     guaiFENesin-Codeine; Take 5 mLs by mouth 3 (three) times daily as needed for cough.  Dispense: 120 mL; Refill: 0 -     Triamcinolone Acetonide  Non-seasonal allergic rhinitis due to pollen Assessment & Plan: Using CPAP for sleep apnea. Scheduled allergy test. Current allergy management with Flonase, Astelin, montelukast, and Claritin. - Continue CPAP use. - Proceed with allergy test. - Continue allergy medications.       Meds ordered this encounter  Medications   guaiFENesin-codeine 100-10 MG/5ML syrup    Sig: Take 5 mLs by mouth 3 (three) times daily as needed for cough.    Dispense:  120 mL    Refill:  0   triamcinolone acetonide (KENALOG-40) injection 80 mg    No orders of the defined types were placed in this encounter.    Follow-up: Return if symptoms worsen or fail to improve.  An After Visit Summary was printed and given to the patient.  Total time spent on today's visit was 36 minutes, including both face-to-face time and nonface-to-face time personally spent on review of chart (labs and imaging), discussing labs and goals, discussing further work-up, treatment options, referrals to specialist if needed, reviewing outside records if pertinent, answering patient's questions, and coordinating care.    Lajuana Matte,  FNP [name] [phone]

## 2023-11-05 NOTE — Assessment & Plan Note (Signed)
 Using CPAP for sleep apnea. Scheduled allergy test. Current allergy management with Flonase, Astelin, montelukast, and Claritin. - Continue CPAP use. - Proceed with allergy test. - Continue allergy medications.

## 2023-11-05 NOTE — Telephone Encounter (Signed)
 Chief Complaint: bloody sputum Symptoms: bloody sputum in the AM, severe cough, SOB on exertion Frequency: 2 wks Pertinent Negatives: Patient denies SOB at rest, CP without coughing, fever, N/V Disposition: [] ED /[] Urgent Care (no appt availability in office) / [x] Appointment(In office/virtual)/ []  [facility]/ [] Home Care/ [] Refused Recommended Disposition /[] [place] [facility]/ []  Follow-up with PCP Additional Notes: Pt reports bloody sputum with coughing in the AM and severe coughing spells. Pt tested positive for the Flu 2 wks ago. Pt states his sputum is only bloody in the morning, states there are bloody streaks mixed in with the mucus. States he coughs 6-7 times in the AM, clears the bloody sputum, and that his mucus is clear the rest of the day. States he is having severe coughing fits. Endorses some CP only with coughing. States he felt SOB yesterday at work while walking. Denies SOB at rest. Denies fever, N/V/abd pain. Per protocol pt scheduled today in office at 1320. Pt agreeable to that plan. RN advised pt if he coughs up bright red blood or develops SOB and CP at rest and without coughing he needs to call 911 to which he verbalized understanding.   Copied from CRM [id number]. Topic: Clinical - Red Word Triage >> Nov 05, 2023  9:19 AM Fuller Mandril wrote: Red Word that prompted transfer to Nurse Triage: Vomit blood , had flu now can't get rid of cough Reason for Disposition  [1] MILD difficulty breathing (e.g., minimal/no SOB at rest, SOB with walking, pulse <100) AND [2] still present when not coughing  Answer Assessment - Initial Assessment Questions 1. ONSET: "When did the cough begin?"      Flu positive 2 wks ago 2. SEVERITY: "How bad is the cough today?"      Pt states there are times where he coughs and coughs and can't stop, frequent coughing spells 3. SPUTUM: "Describe the color of your sputum" (none, dry cough; clear, white, yellow, green)     Blood streaks with  sputum in the AM, then sputum is clear 4. HEMOPTYSIS: "Are you coughing up any blood?" If so ask: "How much?" (flecks, streaks, tablespoons, etc.)     Yes - in the AM, "when I cough and clear my throat I spit 6-7 times and it is clear mucus with blood", after that "it goes away", "I blew my nose and there was a patch of green" 5. DIFFICULTY BREATHING: "Are you having difficulty breathing?" If Yes, ask: "How bad is it?" (e.g., mild, moderate, severe)    - MILD: No SOB at rest, mild SOB with walking, speaks normally in sentences, can lie down, no retractions, pulse < 100.    - MODERATE: SOB at rest, SOB with minimal exertion and prefers to sit, cannot lie down flat, speaks in phrases, mild retractions, audible wheezing, pulse 100-120.    - SEVERE: Very SOB at rest, speaks in single words, struggling to breathe, sitting hunched forward, retractions, pulse > 120      "I'm still not 100%, yesterday at work walking around I had some SOB" 6. FEVER: "Do you have a fever?" If Yes, ask: "What is your temperature, how was it measured, and when did it start?"     No 7. CARDIAC HISTORY: "Do you have any history of heart disease?" (e.g., heart attack, congestive heart failure)      HTN, CAD 8. LUNG HISTORY: "Do you have any history of lung disease?"  (e.g., pulmonary embolus, asthma, emphysema)     Asthma, allergies  to pollen 9. PE RISK FACTORS: "Do you have a history of blood clots?" (or: recent major surgery, recent prolonged travel, bedridden)     No - trigger finger release in November  10. OTHER SYMPTOMS: "Do you have any other symptoms?" (e.g., runny nose, wheezing, chest pain)       No CP at this time, some CP only with coughing.  Protocols used: Cough - Acute Productive-A-AH

## 2023-11-05 NOTE — Assessment & Plan Note (Signed)
 Acute Persistent cough with hemoptysis post-flu, likely bronchitis. Discussed RSV as a potential cause. Steroids to reduce inflammation. Advised saline spray for nasal moisture. Symptom management focus unless worsening. - Administer Kenalog 80 mg injection. - Prescribe guaifenesin with codeine for nighttime use, cautioning against work due to drowsiness. - Use saline spray to moisten nasal passages. - Keep throat coated with lifesavers or similar. - Use honey and lemon as a natural remedy. - Order chest x-ray if shortness of breath or symptoms worsen. - Discuss steroid side effects: increased hunger, irritability, elevated blood sugar. - Practice good hand hygiene and change toothbrush to prevent reinfection.

## 2023-11-16 ENCOUNTER — Other Ambulatory Visit: Payer: Self-pay

## 2023-11-16 MED ORDER — ROSUVASTATIN CALCIUM 40 MG PO TABS: 40.0000 mg | ORAL_TABLET | Freq: Every day | ORAL | 2 refills | Status: DC

## 2023-11-26 ENCOUNTER — Other Ambulatory Visit: Payer: Self-pay | Admitting: Family Medicine

## 2023-11-26 DIAGNOSIS — F988 Other specified behavioral and emotional disorders with onset usually occurring in childhood and adolescence: Secondary | ICD-10-CM

## 2023-11-30 DIAGNOSIS — J4520 Mild intermittent asthma, uncomplicated: Secondary | ICD-10-CM | POA: Diagnosis not present

## 2023-11-30 DIAGNOSIS — G4733 Obstructive sleep apnea (adult) (pediatric): Secondary | ICD-10-CM | POA: Diagnosis not present

## 2023-11-30 DIAGNOSIS — G4761 Periodic limb movement disorder: Secondary | ICD-10-CM | POA: Diagnosis not present

## 2023-11-30 DIAGNOSIS — R400 Somnolence: Secondary | ICD-10-CM | POA: Diagnosis not present

## 2023-12-02 DIAGNOSIS — J301 Allergic rhinitis due to pollen: Secondary | ICD-10-CM | POA: Diagnosis not present

## 2023-12-21 DIAGNOSIS — M65331 Trigger finger, right middle finger: Secondary | ICD-10-CM | POA: Diagnosis not present

## 2023-12-21 DIAGNOSIS — G4733 Obstructive sleep apnea (adult) (pediatric): Secondary | ICD-10-CM | POA: Diagnosis not present

## 2023-12-21 DIAGNOSIS — M79641 Pain in right hand: Secondary | ICD-10-CM | POA: Diagnosis not present

## 2023-12-29 DIAGNOSIS — J301 Allergic rhinitis due to pollen: Secondary | ICD-10-CM | POA: Diagnosis not present

## 2024-01-08 DIAGNOSIS — M65841 Other synovitis and tenosynovitis, right hand: Secondary | ICD-10-CM | POA: Diagnosis not present

## 2024-01-08 DIAGNOSIS — J301 Allergic rhinitis due to pollen: Secondary | ICD-10-CM | POA: Diagnosis not present

## 2024-01-08 DIAGNOSIS — M79641 Pain in right hand: Secondary | ICD-10-CM | POA: Diagnosis not present

## 2024-01-20 DIAGNOSIS — J301 Allergic rhinitis due to pollen: Secondary | ICD-10-CM | POA: Diagnosis not present

## 2024-01-20 DIAGNOSIS — G4733 Obstructive sleep apnea (adult) (pediatric): Secondary | ICD-10-CM | POA: Diagnosis not present

## 2024-01-25 DIAGNOSIS — J301 Allergic rhinitis due to pollen: Secondary | ICD-10-CM | POA: Diagnosis not present

## 2024-01-26 ENCOUNTER — Other Ambulatory Visit: Payer: Self-pay

## 2024-01-26 DIAGNOSIS — F988 Other specified behavioral and emotional disorders with onset usually occurring in childhood and adolescence: Secondary | ICD-10-CM

## 2024-02-02 DIAGNOSIS — J301 Allergic rhinitis due to pollen: Secondary | ICD-10-CM | POA: Diagnosis not present

## 2024-02-12 DIAGNOSIS — J301 Allergic rhinitis due to pollen: Secondary | ICD-10-CM | POA: Diagnosis not present

## 2024-02-16 DIAGNOSIS — J301 Allergic rhinitis due to pollen: Secondary | ICD-10-CM | POA: Diagnosis not present

## 2024-02-20 DIAGNOSIS — G4733 Obstructive sleep apnea (adult) (pediatric): Secondary | ICD-10-CM | POA: Diagnosis not present

## 2024-02-22 DIAGNOSIS — J301 Allergic rhinitis due to pollen: Secondary | ICD-10-CM | POA: Diagnosis not present

## 2024-02-24 ENCOUNTER — Other Ambulatory Visit: Payer: Self-pay | Admitting: Family Medicine

## 2024-02-24 DIAGNOSIS — F988 Other specified behavioral and emotional disorders with onset usually occurring in childhood and adolescence: Secondary | ICD-10-CM

## 2024-03-04 DIAGNOSIS — J301 Allergic rhinitis due to pollen: Secondary | ICD-10-CM | POA: Diagnosis not present

## 2024-03-09 DIAGNOSIS — J301 Allergic rhinitis due to pollen: Secondary | ICD-10-CM | POA: Diagnosis not present

## 2024-03-16 DIAGNOSIS — J301 Allergic rhinitis due to pollen: Secondary | ICD-10-CM | POA: Diagnosis not present

## 2024-03-21 DIAGNOSIS — G4733 Obstructive sleep apnea (adult) (pediatric): Secondary | ICD-10-CM | POA: Diagnosis not present

## 2024-03-22 ENCOUNTER — Other Ambulatory Visit

## 2024-03-22 ENCOUNTER — Other Ambulatory Visit: Payer: Self-pay | Admitting: Family Medicine

## 2024-03-22 DIAGNOSIS — G4733 Obstructive sleep apnea (adult) (pediatric): Secondary | ICD-10-CM | POA: Diagnosis not present

## 2024-03-22 DIAGNOSIS — J301 Allergic rhinitis due to pollen: Secondary | ICD-10-CM | POA: Diagnosis not present

## 2024-03-22 DIAGNOSIS — R400 Somnolence: Secondary | ICD-10-CM | POA: Diagnosis not present

## 2024-03-22 DIAGNOSIS — J4520 Mild intermittent asthma, uncomplicated: Secondary | ICD-10-CM | POA: Diagnosis not present

## 2024-03-22 DIAGNOSIS — E782 Mixed hyperlipidemia: Secondary | ICD-10-CM

## 2024-03-22 DIAGNOSIS — G4761 Periodic limb movement disorder: Secondary | ICD-10-CM | POA: Diagnosis not present

## 2024-03-25 ENCOUNTER — Other Ambulatory Visit: Payer: Self-pay | Admitting: Family Medicine

## 2024-03-25 DIAGNOSIS — F988 Other specified behavioral and emotional disorders with onset usually occurring in childhood and adolescence: Secondary | ICD-10-CM

## 2024-04-01 ENCOUNTER — Ambulatory Visit: Admitting: Family Medicine

## 2024-04-01 ENCOUNTER — Encounter: Payer: Self-pay | Admitting: Family Medicine

## 2024-04-01 VITALS — BP 134/80 | HR 55 | Temp 97.8°F | Resp 16 | Ht 70.0 in | Wt 261.8 lb

## 2024-04-01 DIAGNOSIS — J301 Allergic rhinitis due to pollen: Secondary | ICD-10-CM | POA: Diagnosis not present

## 2024-04-01 DIAGNOSIS — E782 Mixed hyperlipidemia: Secondary | ICD-10-CM

## 2024-04-01 DIAGNOSIS — I2510 Atherosclerotic heart disease of native coronary artery without angina pectoris: Secondary | ICD-10-CM

## 2024-04-01 DIAGNOSIS — R5382 Chronic fatigue, unspecified: Secondary | ICD-10-CM | POA: Diagnosis not present

## 2024-04-01 DIAGNOSIS — I10 Essential (primary) hypertension: Secondary | ICD-10-CM | POA: Diagnosis not present

## 2024-04-01 DIAGNOSIS — G4733 Obstructive sleep apnea (adult) (pediatric): Secondary | ICD-10-CM

## 2024-04-01 DIAGNOSIS — E559 Vitamin D deficiency, unspecified: Secondary | ICD-10-CM

## 2024-04-01 HISTORY — DX: Chronic fatigue, unspecified: R53.82

## 2024-04-01 MED ORDER — TIRZEPATIDE-WEIGHT MANAGEMENT 2.5 MG/0.5ML ~~LOC~~ SOLN: 2.5000 mg | SUBCUTANEOUS | 0 refills | Status: DC

## 2024-04-01 NOTE — Assessment & Plan Note (Signed)
 Reports fatigue and low energy for six months. Symptoms persist despite CPAP use. - Check testosterone levels.

## 2024-04-01 NOTE — Assessment & Plan Note (Addendum)
 Calcium  score of 11.3, CAD RADS 1, less than 25% stenosis of LAD and RCA. Risk reduction with statin therapy of Crestor  40 mg. Managed by cardiologist. - Will add aspirin 81 mg once daily

## 2024-04-01 NOTE — Patient Instructions (Signed)
  VISIT SUMMARY: Today, you came in for your Medicare annual visit. We discussed your current medications, fatigue, weight management, and general health maintenance. We also reviewed your blood pressure and cholesterol management.  YOUR PLAN: -OBESITY WITH OBSTRUCTIVE SLEEP APNEA: Obesity is a condition where you have an excessive amount of body fat, which can worsen sleep apnea, a disorder where breathing repeatedly stops and starts during sleep. We will submit Zepbound  for insurance approval to help manage your sleep apnea. If approved, you will receive instructions on how to use the subcutaneous injections. We will monitor for any contraindications such as medullary thyroid cancer or pancreatic cancer.  -ESSENTIAL HYPERTENSION: Hypertension, or high blood pressure, is when the force of the blood against your artery walls is too high. Your blood pressure is currently well-controlled at 134/80 mmHg. We discussed the noncompliance letter you received for your lisinopril  hydrochlorothiazide , and we will look into the reason for this.  -HYPERLIPIDEMIA: Hyperlipidemia means you have high levels of fats (lipids) in your blood, which can increase your risk of heart disease. You are currently taking Crestor  40 mg for this. We will check your lipid panel before refilling your prescription to see if any adjustments are needed.  -FATIGUE: Fatigue is a feeling of extreme tiredness and low energy. You have been experiencing this for six months despite using a CPAP machine for sleep apnea. We will check your testosterone levels to see if this might be contributing to your fatigue.  -VITAMIN D  DEFICIENCY (SUSPECTED): Vitamin D  deficiency occurs when you do not have enough vitamin D  in your body, which is important for bone health and energy levels. We will check your vitamin D  levels to see if you need supplements.  -GENERAL HEALTH MAINTENANCE: We discussed your struggles with weight loss and your late eating  schedule due to work. I advised you to make dietary changes such as having a larger breakfast and lunch, and a protein shake for dinner on workdays.  INSTRUCTIONS: We will follow up on the insurance approval for Zepbound  and provide instructions if approved. Please get your lipid panel checked before refilling your Crestor  prescription. We will also check your testosterone and vitamin D  levels to address your fatigue and suspected vitamin D  deficiency.                                        Contains text generated by [employer].

## 2024-04-01 NOTE — Assessment & Plan Note (Addendum)
 Currently sees a pulmonologist for OSA and wears a CPAP. Weight loss encouraged to improve his current sleep apnea. Interested in Zepbound  for sleep apnea. Phentermine  not tolerated. Educated that Zepbound  contraindicated in medullary thyroid cancer or pancreatic cancer. - Submit Zepbound  for insurance approval through [employer]. - Provide instructions for subcutaneous injections if approved. - Zepbound  2.5 mg SQ once weekly sent to pharmacy   RESPIRATORY PARAMETERS The overall AHI was 14.0 per hour, with a central apnea index of 0 per hour.  The oxygen nadir was 87% during sleep.

## 2024-04-01 NOTE — Assessment & Plan Note (Signed)
 History of Vitamin D  deficiency Last vitamin D  Lab Results  Component Value Date   VD25OH 44.9 04/28/2023   - labs drawn today, Await labs/testing for assessment and recommendations

## 2024-04-01 NOTE — Assessment & Plan Note (Signed)
 Well controlled - Discussed the role of statins and lowering progression of coronary atherosclerosis and reducing cardiovascular outcomes. Patient is compliant with no side effects.  Last lipids Lab Results  Component Value Date   CHOL 145 08/06/2023   HDL 40 08/06/2023   LDLCALC 87 08/06/2023   TRIG 96 08/06/2023   CHOLHDL 3.6 08/06/2023    - Continue Crestor  40 mg once daily. - labs drawn today, Await labs/testing for assessment and recommendations

## 2024-04-01 NOTE — Assessment & Plan Note (Addendum)
 Suboptimal blood pressure control   Cardiology recommended keeping a log of blood pressure readings at home.   BP Readings from Last 3 Encounters:  04/01/24 134/80  11/05/23 110/72  10/23/23 105/79   - labs drawn today, Await labs/testing for assessment and recommendations - May need to titrate up his current antihypertensive medication dose Lisinopril -hydrochlorothiazide  (ZESTORETIC ) 20-25 MG tablet once daily - Advised him to continue working on diet (DASH) and exercise

## 2024-04-01 NOTE — Assessment & Plan Note (Signed)
 BMI 37.56, not at goal Comorbidities - Hypertension, Dyslipidemia, OSA, and coronary atherosclerosis Dietary and lifestyle modifications including blood pressure, weight loss behavior modifications and increase physical activity for more than 6 months patient has not been able to lose 5% of the body.  Patient was unable to tolerate phentermine .  - Patient will continue lifestyle modification including reducing daily calorie intake, increasing physical activity and behavioral changes.

## 2024-04-01 NOTE — Progress Notes (Signed)
 Subjective:  Patient ID: Brett Roberts, male    DOB: 05-19-1981  Age: 43 y.o. MRN: [id number]  Chief Complaint  Patient presents with   Medical Management of Chronic Issues   Discussed the use of AI scribe software for clinical note transcription with the patient, who gave verbal consent to proceed.  History of Present Illness   Brett Roberts is a 43 year old male who presents for a medical management of chronic issues.  Hypertension: He takes lisinopril  hydrochlorothiazide  for hypertension and recently received a noncompliance letter from his insurance company, which he does not understand. He thinks its because the pharmacy wanted him to sign up for a BP monitoring program. Denies chest pain, shortness of breath, or dizziness. He is compliant with his medication.   Fatigue: He experiences fatigue and low energy levels, describing it as 'just no energy' and a struggle to stay awake, ongoing for over six months. Initially attributed this to CPAP machine use for sleep apnea, but symptoms have not improved.   OSA: He was diagnosed during a preoperative procedure with his cardiologist. Sleep study was completed a couple years ago. He uses a CPAP machine and sees pulmonologist regularly.  He states that it helps his daytime somnolence, but he has not noticed any difference in his fatigue. Specialist recommended at his last visit that he needed to work on losing weight.  Hyperlipidemia: He is on Crestor  for cholesterol management and needs a refill. He has been on a 40 mg dose for about six months. No muscle cramps associated with Crestor .  Coronary atherosclerosis: Calcium  score of 11.3, CAD RADS 1, less than 25% stenosis of LAD and RCA. Risk reduction with statin therapy of Crestor  40 mg.  Morbid Obesity: He struggles with weight management, currently weighing between 250-255 pounds, despite efforts to exercise and eat healthily. He works long hours, leading to late dinners, which he believes  may contribute to his weight issues. He has tried phentermine  for weight loss in the past but discontinued it due to side effects like dry mouth and headaches, and minimal weight loss of about six pounds over three months. He also notes that he felt terrible while he was taking that medication.          04/01/2024    7:36 AM 06/05/2023    8:33 AM 04/28/2023    3:11 PM 01/21/2023    7:53 AM 09/30/2021    8:47 AM  Depression screen PHQ 2/9  Decreased Interest 0 0 0 0 0  Down, Depressed, Hopeless 0 0 1 0 0  PHQ - 2 Score 0 0 1 0 0  Altered sleeping 1 1 1  0   Tired, decreased energy 3 1 1 2    Change in appetite 1 0 0 0   Feeling bad or failure about yourself  0 0 0 0   Trouble concentrating 0 0 1 0   Moving slowly or fidgety/restless 0 0 0 0   Suicidal thoughts 0 0 0 0   PHQ-9 Score 5 2 4 2    Difficult doing work/chores Very difficult Not difficult at all Not difficult at all Not difficult at all         04/01/2024    7:36 AM  Fall Risk   Falls in the past year? 0  Number falls in past yr: 0  Injury with Fall? 0  Risk for fall due to : No Fall Risks  Follow up Falls evaluation completed    Patient  Care Team: Teressa Harrie HERO, FNP as PCP - General (Family Medicine) Sherre Clapper, MD (Family Medicine)   Review of Systems  Constitutional:  Positive for fatigue. Negative for appetite change and fever.  HENT:  Negative for congestion, ear pain, sinus pressure and sore throat.   Eyes: Negative.   Respiratory:  Negative for cough, chest tightness, shortness of breath and wheezing.   Cardiovascular:  Negative for chest pain and palpitations.  Gastrointestinal:  Negative for abdominal pain, constipation, diarrhea, nausea and vomiting.  Endocrine: Negative.   Genitourinary:  Negative for dysuria, frequency, hematuria and urgency.  Musculoskeletal:  Negative for arthralgias, back pain, joint swelling and myalgias.  Skin:  Negative for rash.  Allergic/Immunologic: Negative.   Neurological:   Negative for dizziness, weakness, light-headedness and headaches.  Hematological: Negative.   Psychiatric/Behavioral: Negative.  Negative for dysphoric mood. The patient is not nervous/anxious.     Current Outpatient Medications on File Prior to Visit  Medication Sig Dispense Refill   albuterol  (VENTOLIN  HFA) 108 (90 Base) MCG/ACT inhaler Inhale 2 puffs into the lungs every 4 (four) hours as needed.     azelastine  (ASTELIN ) 0.1 % nasal spray Place 1 spray into both nostrils 2 (two) times daily. Use in each nostril as directed (Patient taking differently: Place 1 spray into both nostrils as needed for rhinitis or allergies.) 30 mL 12   EPINEPHrine 0.3 mg/0.3 mL IJ SOAJ injection Inject 0.3 mg into the muscle as needed.     fluticasone  (FLONASE ) 50 MCG/ACT nasal spray Place 2 sprays into both nostrils daily. (Patient taking differently: Place 2 sprays into both nostrils daily. AS NEEDED) 16 g 11   guaiFENesin -codeine  100-10 MG/5ML syrup Take 5 mLs by mouth 3 (three) times daily as needed for cough. 120 mL 0   ibuprofen  (ADVIL ) 200 MG tablet Take 200 mg by mouth every 6 (six) hours as needed for headache, mild pain (pain score 1-3) or moderate pain (pain score 4-6).     lisinopril -hydrochlorothiazide  (ZESTORETIC ) 20-25 MG tablet Take 1 tablet by mouth daily. 90 tablet 3   loratadine (CLARITIN) 10 MG tablet Take 10 mg by mouth daily.     METHYLPHENIDATE  18 MG PO CR tablet Take 1 tablet (18 mg total) by mouth daily. 30 tablet 0   rosuvastatin  (CRESTOR ) 40 MG tablet Take 1 tablet (40 mg total) by mouth daily. 90 tablet 2   Vitamin D , Ergocalciferol , (DRISDOL ) 1.25 MG (50000 UNIT) CAPS capsule Take 1 capsule (50,000 Units total) by mouth every 7 (seven) days. (Patient not taking: Reported on 04/01/2024) 12 capsule 2   No current facility-administered medications on file prior to visit.   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/2023   Atypical chest pain 01/07/2021   Chest pain 01/07/2021   Class 2 severe obesity due to excess calories with serious comorbidity and body mass index (BMI) of 36.0 to 36.9 in adult [facility]) 10/18/2022   Coronary atherosclerosis, calcium  score 11.3, CAD RADS 1, less than 25% stenosis of LAD and RCA 07/31/2023   Essential hypertension 01/07/2021   Family history of premature CAD 01/07/2021   Gastroesophageal reflux disease without esophagitis 07/03/2023   Headache above the eye region 05/13/2023   Hypertension    Mixed hyperlipidemia 10/18/2022  Morbid obesity (HCC) 05/02/2023   OSA (obstructive sleep apnea) 01/07/2021   Seasonal allergies    Trigger middle finger 11/19/2020   Vitamin D  deficiency 05/02/2023   Past Surgical History:  Procedure Laterality Date   TRIGGER FINGER RELEASE Right 07/01/2023   Procedure: RELEASE MIDDLE TRIGGER FINGER/A-1 PULLEY;  Surgeon: Romona Harari, MD;  Location:  [facility];  Service: Orthopedics;  Laterality: Right;  local 30    Family History  Problem Relation Age of Onset   Hypertension Mother    Heart attack Father    Hypertension Sister    Hyperlipidemia Brother    Atopy Other    Eczema Other    Urticaria Other    Immunodeficiency Other    Asthma Other    Angioedema Other    Allergic rhinitis Other    Social History   Socioeconomic History   Marital status: Married    Spouse name: Not on file   Number of children: Not on file   Years of education: Not on file   Highest education level: Not on file  Occupational History   Not on file  Tobacco Use   Smoking status: Never   Smokeless tobacco: Never  Vaping Use   Vaping status: Never Used  Substance and Sexual Activity   Alcohol use: Yes    Alcohol/week: 1.0 - 2.0  standard drink of alcohol    Types: 1 - 2 Cans of beer per week    Comment: twice weekly    Drug use: Never   Sexual activity: Yes  Other Topics Concern   Not on file  Social History Narrative   ** Merged History Encounter **       Social Drivers of Health   Financial Resource Strain: Low Risk  (01/21/2023)   Overall Financial Resource Strain (CARDIA)    Difficulty of Paying Living Expenses: Not hard at all  Food Insecurity: No Food Insecurity (01/21/2023)   Hunger Vital Sign    Worried About Running Out of Food in the Last Year: Never true    Ran Out of Food in the Last Year: Never true  Transportation Needs: No Transportation Needs (01/21/2023)   PRAPARE - Administrator, Civil Service (Medical): No    Lack of Transportation (Non-Medical): No  Physical Activity: Insufficiently Active (01/21/2023)   Exercise Vital Sign    Days of Exercise per Week: 2 days    Minutes of Exercise per Session: 60 min  Stress: No Stress Concern Present (01/21/2023)   [employer] of Occupational Health - Occupational Stress Questionnaire    Feeling of Stress : Not at all  Social Connections: Moderately Isolated (01/21/2023)   Social Connection and Isolation Panel    Frequency of Communication with Friends and Family: Three times a week    Frequency of Social Gatherings with Friends and Family: Three times a week    Attends Religious Services: Never    Active Member of Clubs or Organizations: No    Attends Banker Meetings: Never    Marital Status: Married    Objective:  BP 134/80   Pulse (!) 55   Temp 97.8 F (36.6 C) (Temporal)   Resp 16   Ht 5' 10 (1.778 m)   Wt 261 lb 12.8 oz (118.8 kg)   SpO2 98%   BMI 37.56 kg/m      04/01/2024    7:32 AM 11/05/2023    1:22 PM 10/23/2023    1:22 PM  BP/Weight  Systolic BP 134 110 105  Diastolic BP 80 72 79  Wt. (Lbs) 261.8 261   BMI 37.56 kg/m2 37.45 kg/m2     Physical Exam Vitals reviewed.  Constitutional:       General: He is not in acute distress.    Appearance: Normal appearance.  Eyes:     Conjunctiva/sclera: Conjunctivae normal.  Cardiovascular:     Rate and Rhythm: Normal rate and regular rhythm.     Heart sounds: Normal heart sounds. No murmur heard. Pulmonary:     Effort: Pulmonary effort is normal.     Breath sounds: Normal breath sounds. No wheezing.  Abdominal:     Palpations: Abdomen is soft.  Musculoskeletal:        General: Normal range of motion.     Cervical back: Normal range of motion.  Skin:    General: Skin is warm.  Neurological:     Mental Status: He is alert. Mental status is at baseline.  Psychiatric:        Mood and Affect: Mood normal.        Behavior: Behavior normal.     Lab Results  Component Value Date   WBC 6.7 08/06/2023   HGB 14.2 08/06/2023   HCT 44.8 08/06/2023   PLT 248 08/06/2023   GLUCOSE 85 08/06/2023   CHOL 145 08/06/2023   TRIG 96 08/06/2023   HDL 40 08/06/2023   LDLCALC 87 08/06/2023   ALT 26 08/06/2023   AST 24 08/06/2023   NA 143 08/06/2023   K 4.9 08/06/2023   CL 104 08/06/2023   CREATININE 1.25 08/06/2023   BUN 19 08/06/2023   CO2 24 08/06/2023   TSH 1.710 04/28/2023   HGBA1C 5.6 01/30/2022      Assessment & Plan:  Essential hypertension Assessment & Plan: Suboptimal blood pressure control   Cardiology recommended keeping a log of blood pressure readings at home.   BP Readings from Last 3 Encounters:  04/01/24 134/80  11/05/23 110/72  10/23/23 105/79   - labs drawn today, Await labs/testing for assessment and recommendations - May need to titrate up his current antihypertensive medication dose Lisinopril -hydrochlorothiazide  (ZESTORETIC ) 20-25 MG tablet once daily - Advised him to continue working on diet (DASH) and exercise  Orders: -     CBC with Differential/Platelet -     CMP14+EGFR  Atherosclerosis of native coronary artery of native heart without angina pectoris Assessment & Plan: Calcium  score of  11.3, CAD RADS 1, less than 25% stenosis of LAD and RCA. Risk reduction with statin therapy of Crestor  40 mg. Managed by cardiologist. - Will add aspirin 81 mg once daily   Mixed hyperlipidemia Assessment & Plan: Well controlled - Discussed the role of statins and lowering progression of coronary atherosclerosis and reducing cardiovascular outcomes. Patient is compliant with no side effects.  Last lipids Lab Results  Component Value Date   CHOL 145 08/06/2023   HDL 40 08/06/2023   LDLCALC 87 08/06/2023   TRIG 96 08/06/2023   CHOLHDL 3.6 08/06/2023    - Continue Crestor  40 mg once daily. - labs drawn today, Await labs/testing for assessment and recommendations    Orders: -     Lipid panel  OSA (obstructive sleep apnea) Assessment & Plan: Currently sees a pulmonologist for OSA and wears a CPAP. Weight loss encouraged to improve his current sleep apnea. Interested in Zepbound  for sleep apnea. Phentermine  not tolerated. Educated that Zepbound  contraindicated in medullary thyroid cancer or pancreatic cancer. - Submit Zepbound  for  insurance approval through [employer]. - Provide instructions for subcutaneous injections if approved. - Zepbound  2.5 mg SQ once weekly sent to pharmacy   RESPIRATORY PARAMETERS The overall AHI was 14.0 per hour, with a central apnea index of 0 per hour.  The oxygen nadir was 87% during sleep.  Orders: -     Tirzepatide -Weight Management; Inject 2.5 mg into the skin once a week.  Dispense: 2 mL; Refill: 0  Vitamin D  deficiency Assessment & Plan: History of Vitamin D  deficiency Last vitamin D  Lab Results  Component Value Date   VD25OH 44.9 04/28/2023   - labs drawn today, Await labs/testing for assessment and recommendations   Orders: -     VITAMIN D  25 Hydroxy (Vit-D Deficiency, Fractures)  Chronic fatigue Assessment & Plan: Reports fatigue and low energy for six months. Symptoms persist despite CPAP use. - Check testosterone  levels.  Orders: -     Testosterone,Free and Total  Morbid obesity (HCC) Assessment & Plan: BMI 37.56, not at goal Comorbidities - Hypertension, Dyslipidemia, OSA, and coronary atherosclerosis Dietary and lifestyle modifications including blood pressure, weight loss behavior modifications and increase physical activity for more than 6 months patient has not been able to lose 5% of the body.  Patient was unable to tolerate phentermine .  - Patient will continue lifestyle modification including reducing daily calorie intake, increasing physical activity and behavioral changes.         Meds ordered this encounter  Medications   tirzepatide  (ZEPBOUND ) 2.5 MG/0.5ML injection vial    Sig: Inject 2.5 mg into the skin once a week.    Dispense:  2 mL    Refill:  0    Orders Placed This Encounter  Procedures   CBC with Differential/Platelet   CMP14+EGFR   Lipid panel   Testosterone,Free and Total   Vitamin D , 25-hydroxy     Follow-up: Return in about 4 weeks (around 04/29/2024) for med check.   I,Angela Taylor,acting as a Neurosurgeon for Harrie CHRISTELLA Cedar, FNP.,have documented all relevant documentation on the behalf of Harrie CHRISTELLA Cedar, FNP,as directed by  Harrie CHRISTELLA Cedar, FNP while in the presence of Harrie CHRISTELLA Cedar, FNP.   An After Visit Summary was printed and given to the patient.  I attest that I have reviewed this visit and agree with the plan scribed by my staff.   Harrie CHRISTELLA Cedar, FNP [name] [phone]

## 2024-04-06 ENCOUNTER — Telehealth: Payer: Self-pay

## 2024-04-06 LAB — LIPID PANEL
Chol/HDL Ratio: 2.7 ratio (ref 0.0–5.0)
Cholesterol, Total: 118 mg/dL (ref 100–199)
HDL: 44 mg/dL (ref >39)
LDL Chol Calc (NIH): 61 mg/dL (ref 0–99)
Triglycerides: 57 mg/dL (ref 0–149)
VLDL Cholesterol Cal: 13 mg/dL (ref 5–40)

## 2024-04-06 LAB — CBC WITH DIFFERENTIAL/PLATELET
Basophils Absolute: 0 x10E3/uL (ref 0.0–0.2)
Basos: 1 %
EOS (ABSOLUTE): 0.1 x10E3/uL (ref 0.0–0.4)
Eos: 2 %
Hematocrit: 40.8 % (ref 37.5–51.0)
Hemoglobin: 13.6 g/dL (ref 13.0–17.7)
Immature Grans (Abs): 0 x10E3/uL (ref 0.0–0.1)
Immature Granulocytes: 0 %
Lymphocytes Absolute: 2.4 x10E3/uL (ref 0.7–3.1)
Lymphs: 39 %
MCH: 30.9 pg (ref 26.6–33.0)
MCHC: 33.3 g/dL (ref 31.5–35.7)
MCV: 93 fL (ref 79–97)
Monocytes Absolute: 0.6 x10E3/uL (ref 0.1–0.9)
Monocytes: 10 %
Neutrophils Absolute: 3.1 x10E3/uL (ref 1.4–7.0)
Neutrophils: 48 %
Platelets: 242 x10E3/uL (ref 150–450)
RBC: 4.4 x10E6/uL (ref 4.14–5.80)
RDW: 12.9 % (ref 11.6–15.4)
WBC: 6.2 x10E3/uL (ref 3.4–10.8)

## 2024-04-06 LAB — VITAMIN D 25 HYDROXY (VIT D DEFICIENCY, FRACTURES): Vit D, 25-Hydroxy: 40.3 ng/mL (ref 30.0–100.0)

## 2024-04-06 LAB — CMP14+EGFR
ALT: 23 IU/L (ref 0–44)
AST: 23 IU/L (ref 0–40)
Albumin: 4.3 g/dL (ref 4.1–5.1)
Alkaline Phosphatase: 81 IU/L (ref 44–121)
BUN/Creatinine Ratio: 17 (ref 9–20)
BUN: 20 mg/dL (ref 6–24)
Bilirubin Total: 0.4 mg/dL (ref 0.0–1.2)
CO2: 23 mmol/L (ref 20–29)
Calcium: 9.2 mg/dL (ref 8.7–10.2)
Chloride: 103 mmol/L (ref 96–106)
Creatinine, Ser: 1.19 mg/dL (ref 0.76–1.27)
Globulin, Total: 2.8 g/dL (ref 1.5–4.5)
Glucose: 97 mg/dL (ref 70–99)
Potassium: 4.7 mmol/L (ref 3.5–5.2)
Sodium: 140 mmol/L (ref 134–144)
Total Protein: 7.1 g/dL (ref 6.0–8.5)
eGFR: 78 mL/min/1.73 (ref >59)

## 2024-04-06 LAB — TESTOSTERONE,FREE AND TOTAL
Testosterone, Free: 6.8 pg/mL (ref 6.8–21.5)
Testosterone: 354 ng/dL (ref 264–916)

## 2024-04-06 NOTE — Telephone Encounter (Signed)
 Patient called wanted results for his labs.  Printed labs off [employer] website and provider looked at them, per [employer]. FNP: labs are normal continue current treatment, and the Free Testerone is pending and he will get a call from our office once it has resulted.  Patient verbalized understanding.

## 2024-04-07 ENCOUNTER — Ambulatory Visit: Payer: Self-pay | Admitting: Family Medicine

## 2024-04-07 DIAGNOSIS — G4733 Obstructive sleep apnea (adult) (pediatric): Secondary | ICD-10-CM | POA: Diagnosis not present

## 2024-04-07 DIAGNOSIS — R400 Somnolence: Secondary | ICD-10-CM | POA: Diagnosis not present

## 2024-04-08 DIAGNOSIS — J301 Allergic rhinitis due to pollen: Secondary | ICD-10-CM | POA: Diagnosis not present

## 2024-04-13 DIAGNOSIS — R400 Somnolence: Secondary | ICD-10-CM | POA: Diagnosis not present

## 2024-04-13 DIAGNOSIS — G4733 Obstructive sleep apnea (adult) (pediatric): Secondary | ICD-10-CM | POA: Diagnosis not present

## 2024-04-13 DIAGNOSIS — G4761 Periodic limb movement disorder: Secondary | ICD-10-CM | POA: Diagnosis not present

## 2024-04-13 DIAGNOSIS — J301 Allergic rhinitis due to pollen: Secondary | ICD-10-CM | POA: Diagnosis not present

## 2024-04-13 DIAGNOSIS — J4520 Mild intermittent asthma, uncomplicated: Secondary | ICD-10-CM | POA: Diagnosis not present

## 2024-04-18 DIAGNOSIS — J301 Allergic rhinitis due to pollen: Secondary | ICD-10-CM | POA: Diagnosis not present

## 2024-04-25 ENCOUNTER — Other Ambulatory Visit: Payer: Self-pay | Admitting: Family Medicine

## 2024-04-25 DIAGNOSIS — F988 Other specified behavioral and emotional disorders with onset usually occurring in childhood and adolescence: Secondary | ICD-10-CM

## 2024-04-26 ENCOUNTER — Other Ambulatory Visit: Payer: Self-pay | Admitting: Family Medicine

## 2024-04-26 DIAGNOSIS — F988 Other specified behavioral and emotional disorders with onset usually occurring in childhood and adolescence: Secondary | ICD-10-CM

## 2024-04-26 MED ORDER — METHYLPHENIDATE HCL ER (OSM) 18 MG PO TBCR: 18.0000 mg | EXTENDED_RELEASE_TABLET | Freq: Every day | ORAL | 0 refills | Status: DC

## 2024-04-29 ENCOUNTER — Encounter: Payer: Self-pay | Admitting: Family Medicine

## 2024-04-29 ENCOUNTER — Ambulatory Visit: Admitting: Family Medicine

## 2024-04-29 VITALS — BP 110/70 | HR 81 | Temp 98.3°F | Resp 18 | Ht 70.0 in | Wt 263.0 lb

## 2024-04-29 DIAGNOSIS — J301 Allergic rhinitis due to pollen: Secondary | ICD-10-CM | POA: Diagnosis not present

## 2024-04-29 DIAGNOSIS — E782 Mixed hyperlipidemia: Secondary | ICD-10-CM

## 2024-04-29 DIAGNOSIS — Z23 Encounter for immunization: Secondary | ICD-10-CM | POA: Diagnosis not present

## 2024-04-29 DIAGNOSIS — I10 Essential (primary) hypertension: Secondary | ICD-10-CM

## 2024-04-29 DIAGNOSIS — F988 Other specified behavioral and emotional disorders with onset usually occurring in childhood and adolescence: Secondary | ICD-10-CM

## 2024-04-29 HISTORY — DX: Encounter for immunization: Z23

## 2024-04-29 MED ORDER — METHYLPHENIDATE HCL ER 36 MG PO TB24: 36.0000 mg | ORAL_TABLET | Freq: Every day | ORAL | 0 refills | Status: DC

## 2024-04-29 NOTE — Assessment & Plan Note (Signed)
 Well controlled - Discussed the role of statins and lowering progression of coronary atherosclerosis and reducing cardiovascular outcomes. Patient is compliant with complaints of leg cramps recently.  Last lipids Lab Results  Component Value Date   CHOL 118 04/01/2024   HDL 44 04/01/2024   LDLCALC 61 04/01/2024   TRIG 57 04/01/2024   CHOLHDL 2.7 04/01/2024    - Decrease Crestor  to 20 mg - cut 40 mg tablet in half so you don't have to buy a new Rx.

## 2024-04-29 NOTE — Patient Instructions (Signed)
  VISIT SUMMARY: Today, we reviewed your current medications and health conditions, including ADHD, hyperlipidemia, allergic rhinitis, and your cardiovascular health. We discussed adjustments to your ADHD medication, continued management of your cholesterol and allergies, and the need for a follow-up with your cardiologist. Additionally, we administered your annual flu shot.  YOUR PLAN: -ATTENTION-DEFICIT HYPERACTIVITY DISORDER (ADHD): ADHD is a condition that affects focus and behavior. You reported that your current medication dose is not effective, so we have increased your methylphenidate  dose to 36 mg daily to improve your focus and functionality.  -HYPERLIPIDEMIA: Hyperlipidemia means having high levels of fats (lipids) in your blood. Your cholesterol levels have improved with Crestor  40 mg. Continue taking Crestor  40 mg, but you have the option to halve the tablets to reduce the dose if needed. Monitor for any side effects like leg cramps.  -ALLERGIC RHINITIS: Allergic rhinitis is an allergic reaction that causes sneezing, congestion, and a runny nose. You are seeing improvement with your weekly allergy shots. Continue with the shots and take Claritin to manage your symptoms.  -CARDIOVASCULAR FOLLOW-UP: You have a history of ischemic vascular disease and are overdue for a follow-up with your cardiologist. Please contact your cardiologist to schedule an appointment and discuss the possibility of starting aspirin therapy.  -GENERAL HEALTH MAINTENANCE: We discussed the importance of getting an annual flu vaccination. You received your flu shot today.  INSTRUCTIONS: Please contact your cardiologist to schedule a follow-up appointment and discuss aspirin therapy. Continue with your current medications and monitor for any side effects. If you have any concerns or new symptoms, please schedule an appointment.                      Contains text generated by [employer].                                  Contains text generated by [employer].

## 2024-04-29 NOTE — Assessment & Plan Note (Signed)
 Well controlled  BP Readings from Last 3 Encounters:  04/29/24 110/70  04/01/24 134/80  11/05/23 110/72   - Continue Lisinopril -hydrochlorothiazide  (ZESTORETIC ) 20-25 MG tablet once daily - Advised him to continue working on diet (DASH) and exercise

## 2024-04-29 NOTE — Assessment & Plan Note (Signed)
 General Health Maintenance Discussed the importance of annual flu vaccination. - Administer flu shot today

## 2024-04-29 NOTE — Addendum Note (Signed)
 Addended by: Karelly Dewalt K on: 04/29/2024 10:23 AM   Modules accepted: Orders

## 2024-04-29 NOTE — Assessment & Plan Note (Signed)
 Attention-deficit hyperactivity disorder (ADHD) ADHD with suboptimal response to current medication regimen. Improved functionality with increased dose of methylphenidate . - Increase methylphenidate  dose to 36 mg daily. Stable on Ritalin  for the past two months. No adverse effects noted. -Continue Ritalin  as prescribed.HTN: Monitored with diet.

## 2024-04-29 NOTE — Progress Notes (Signed)
 Subjective:  Patient ID: Brett Roberts, male    DOB: August 09, 1981  Age: 43 y.o. MRN: [id number]  Chief Complaint  Patient presents with   Medical Management of Chronic Issues    Discussed the use of AI scribe software for clinical note transcription with the patient, who gave verbal consent to proceed.  History of Present Illness   Brett Roberts is a 43 year old male with ischemic vascular disease who presents for medication management and follow-up.  He is currently taking Crestor  40 mg for cholesterol management. His LDL cholesterol has improved from 87 to 61, and his total cholesterol has decreased from 145 to 118. No side effects from Crestor , such as leg cramps, have been experienced.  He has a history of chest pain, for which he was evaluated in the ER in [facility] and subsequently followed up with a cardiologist in [place]. A CT coronary angiogram was performed, showing no acute findings. He is unsure about the current status of his cardiologist and is due for a follow-up appointment.  He is managing ADHD with medication, currently on a low dose which he feels is no longer effective. He conducted a personal trial by doubling his dose for a week, which he found improved his focus and functionality.  He is undergoing allergy shots once a week and has noticed some improvement in symptoms, although he experiences a sore and itchy throat post-injection. He is also taking Claritin to manage his allergies.  In terms of lifestyle, he is trying to improve his physical activity by walking and attending the [employer]. He reports making dietary changes by reducing portion sizes, although he feels his diet is otherwise unchanged.            04/01/2024    7:36 AM 06/05/2023    8:33 AM 04/28/2023    3:11 PM 01/21/2023    7:53 AM 09/30/2021    8:47 AM  Depression screen PHQ 2/9  Decreased Interest 0 0 0 0 0  Down, Depressed, Hopeless 0 0 1 0 0  PHQ - 2 Score 0 0 1 0 0  Altered  sleeping 1 1 1  0   Tired, decreased energy 3 1 1 2    Change in appetite 1 0 0 0   Feeling bad or failure about yourself  0 0 0 0   Trouble concentrating 0 0 1 0   Moving slowly or fidgety/restless 0 0 0 0   Suicidal thoughts 0 0 0 0   PHQ-9 Score 5 2 4 2    Difficult doing work/chores Very difficult Not difficult at all Not difficult at all Not difficult at all         04/01/2024    7:36 AM  Fall Risk   Falls in the past year? 0  Number falls in past yr: 0  Injury with Fall? 0  Risk for fall due to : No Fall Risks  Follow up Falls evaluation completed    Patient Care Team: Teressa Harrie HERO, FNP as PCP - General (Family Medicine) [name], Abigail, MD (Family Medicine)   Review of Systems  Constitutional:  Negative for appetite change, fatigue and fever.  HENT:  Negative for congestion, ear pain, sinus pressure and sore throat.   Eyes: Negative.   Respiratory:  Negative for cough, chest tightness, shortness of breath and wheezing.   Cardiovascular:  Negative for chest pain and palpitations.  Gastrointestinal:  Negative for abdominal pain, constipation, diarrhea, nausea and vomiting.  Endocrine: Negative.   Genitourinary:  Negative for dysuria, frequency, hematuria and urgency.  Musculoskeletal:  Negative for arthralgias, back pain, joint swelling and myalgias.  Skin:  Negative for rash.  Allergic/Immunologic: Negative.   Neurological:  Positive for headaches. Negative for dizziness, weakness and light-headedness.  Hematological: Negative.   Psychiatric/Behavioral:  Negative for dysphoric mood. The patient is not nervous/anxious.     Current Outpatient Medications on File Prior to Visit  Medication Sig Dispense Refill   albuterol  (VENTOLIN  HFA) 108 (90 Base) MCG/ACT inhaler Inhale 2 puffs into the lungs every 4 (four) hours as needed.     EPINEPHrine 0.3 mg/0.3 mL IJ SOAJ injection Inject 0.3 mg into the muscle as needed.     ibuprofen  (ADVIL ) 200 MG tablet Take 200 mg by mouth  every 6 (six) hours as needed for headache, mild pain (pain score 1-3) or moderate pain (pain score 4-6).     lisinopril -hydrochlorothiazide  (ZESTORETIC ) 20-25 MG tablet Take 1 tablet by mouth daily. 90 tablet 3   loratadine (CLARITIN) 10 MG tablet Take 10 mg by mouth daily.     rosuvastatin  (CRESTOR ) 40 MG tablet Take 1 tablet (40 mg total) by mouth daily. 90 tablet 2   No current facility-administered medications on file prior to visit.   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   Atypical chest pain 01/07/2021   Chest pain 01/07/2021   Class 2 severe obesity due to excess calories with serious comorbidity and body mass index (BMI) of 36.0 to 36.9 in adult [facility]) 10/18/2022   Coronary atherosclerosis, calcium  score 11.3, CAD RADS 1, less than 25% stenosis of LAD and RCA 07/31/2023   Essential hypertension 01/07/2021   Family history of premature CAD 01/07/2021   Gastroesophageal reflux disease without esophagitis 07/03/2023   Headache above the eye region 05/13/2023   Hypertension    Mixed hyperlipidemia 10/18/2022   Morbid obesity (HCC) 05/02/2023   OSA (obstructive sleep apnea) 01/07/2021   Seasonal allergies    Trigger middle finger 11/19/2020   Vitamin D  deficiency 05/02/2023   Past Surgical History:  Procedure Laterality Date   TRIGGER FINGER RELEASE Right 07/01/2023   Procedure: RELEASE MIDDLE TRIGGER FINGER/A-1 PULLEY;  Surgeon: Romona Harari, MD;  Location: [facility];  Service: Orthopedics;  Laterality: Right;  local 30    Family History  Problem Relation Age of Onset   Hypertension Mother    Heart attack Father    Hypertension Sister    Hyperlipidemia Brother    Atopy Other    Eczema  Other    Urticaria Other    Immunodeficiency Other    Asthma Other    Angioedema Other    Allergic rhinitis Other    Social History   Socioeconomic History   Marital status: Married    Spouse name: Not on file   Number of children: Not on file   Years of education: Not on file   Highest education level: Not on file  Occupational History   Not on file  Tobacco Use   Smoking status: Never   Smokeless tobacco: Never  Vaping Use   Vaping status: Never Used  Substance and Sexual Activity   Alcohol use: Yes    Alcohol/week: 1.0 - 2.0 standard  drink of alcohol    Types: 1 - 2 Cans of beer per week    Comment: twice weekly    Drug use: Never   Sexual activity: Yes  Other Topics Concern   Not on file  Social History Narrative   ** Merged History Encounter **       Social Drivers of Health   Financial Resource Strain: Low Risk  (01/21/2023)   Overall Financial Resource Strain (CARDIA)    Difficulty of Paying Living Expenses: Not hard at all  Food Insecurity: No Food Insecurity (01/21/2023)   Hunger Vital Sign    Worried About Running Out of Food in the Last Year: Never true    Ran Out of Food in the Last Year: Never true  Transportation Needs: No Transportation Needs (01/21/2023)   PRAPARE - Administrator, Civil Service (Medical): No    Lack of Transportation (Non-Medical): No  Physical Activity: Insufficiently Active (01/21/2023)   Exercise Vital Sign    Days of Exercise per Week: 2 days    Minutes of Exercise per Session: 60 min  Stress: No Stress Concern Present (01/21/2023)   [employer] of Occupational Health - Occupational Stress Questionnaire    Feeling of Stress : Not at all  Social Connections: Moderately Isolated (01/21/2023)   Social Connection and Isolation Panel    Frequency of Communication with Friends and Family: Three times a week    Frequency of Social Gatherings with Friends and Family: Three times a week    Attends Religious Services:  Never    Active Member of Clubs or Organizations: No    Attends Banker Meetings: Never    Marital Status: Married    Objective:  BP 110/70   Pulse 81   Temp 98.3 F (36.8 C) (Temporal)   Resp 18   Ht 5' 10 (1.778 m)   Wt 263 lb (119.3 kg)   SpO2 96%   BMI 37.74 kg/m      04/29/2024    8:34 AM 04/01/2024    7:32 AM 11/05/2023    1:22 PM  BP/Weight  Systolic BP 110 134 110  Diastolic BP 70 80 72  Wt. (Lbs) 263 261.8 261  BMI 37.74 kg/m2 37.56 kg/m2 37.45 kg/m2    Physical Exam Vitals reviewed.  Constitutional:      General: He is not in acute distress.    Appearance: Normal appearance.  Eyes:     Conjunctiva/sclera: Conjunctivae normal.  Cardiovascular:     Rate and Rhythm: Normal rate and regular rhythm.     Heart sounds: Normal heart sounds. No murmur heard. Pulmonary:     Effort: Pulmonary effort is normal.     Breath sounds: Normal breath sounds. No wheezing.  Abdominal:     Palpations: Abdomen is soft.  Musculoskeletal:        General: Normal range of motion.  Skin:    General: Skin is warm.  Neurological:     Mental Status: He is alert. Mental status is at baseline.  Psychiatric:        Mood and Affect: Mood normal.        Behavior: Behavior normal.     Lab Results  Component Value Date   WBC 6.2 04/01/2024   HGB 13.6 04/01/2024   HCT 40.8 04/01/2024   PLT 242 04/01/2024   GLUCOSE 97 04/01/2024   CHOL 118 04/01/2024   TRIG 57 04/01/2024   HDL 44 04/01/2024   LDLCALC 61 04/01/2024  ALT 23 04/01/2024   AST 23 04/01/2024   NA 140 04/01/2024   K 4.7 04/01/2024   CL 103 04/01/2024   CREATININE 1.19 04/01/2024   BUN 20 04/01/2024   CO2 23 04/01/2024   TSH 1.710 04/28/2023   HGBA1C 5.6 01/30/2022    Assessment & Plan:  Attention deficit disorder (ADD) without hyperactivity Assessment & Plan: Attention-deficit hyperactivity disorder (ADHD) ADHD with suboptimal response to current medication regimen. Improved functionality with  increased dose of methylphenidate . - Increase methylphenidate  dose to 36 mg daily. Stable on Ritalin  for the past two months. No adverse effects noted. -Continue Ritalin  as prescribed.HTN: Monitored with diet.  Orders: -     Methylphenidate  HCl ER; Take 1 tablet (36 mg total) by mouth daily.  Dispense: 30 tablet; Refill: 0  Essential hypertension Assessment & Plan: Well controlled  BP Readings from Last 3 Encounters:  04/29/24 110/70  04/01/24 134/80  11/05/23 110/72   - Continue Lisinopril -hydrochlorothiazide  (ZESTORETIC ) 20-25 MG tablet once daily - Advised him to continue working on diet (DASH) and exercise   Mixed hyperlipidemia Assessment & Plan: Well controlled - Discussed the role of statins and lowering progression of coronary atherosclerosis and reducing cardiovascular outcomes. Patient is compliant with complaints of leg cramps recently.  Last lipids Lab Results  Component Value Date   CHOL 118 04/01/2024   HDL 44 04/01/2024   LDLCALC 61 04/01/2024   TRIG 57 04/01/2024   CHOLHDL 2.7 04/01/2024    - Decrease Crestor  to 20 mg - cut 40 mg tablet in half so you don't have to buy a new Rx.     Morbid obesity (HCC) Assessment & Plan: BMI 37.56, not at goal Comorbidities - Hypertension, Dyslipidemia, OSA, and coronary atherosclerosis Dietary and lifestyle modifications including blood pressure, weight loss behavior modifications and increase physical activity for more than 6 months patient has not been able to lose 5% of the body.  Patient was unable to tolerate phentermine .  - Patient will continue lifestyle modification including reducing daily calorie intake, increasing physical activity and behavioral changes.   - Zepbound  not approved by insurance    Non-seasonal allergic rhinitis due to pollen Assessment & Plan: Allergic rhinitis Chronic allergic rhinitis with improvement from allergy shots. Experiences sore throat and itchiness post-injection. -  Continue allergy shots as scheduled. - Continue Claritin for symptom management.   Influenza vaccine administered Assessment & Plan: General Health Maintenance Discussed the importance of annual flu vaccination. - Administer flu shot today     Cardiovascular follow-up Overdue follow-up with cardiologist. No current diagnosis of coronary artery disease, but ischemic vascular disease noted. - Contact cardiologist to schedule follow-up appointment. - Discuss aspirin therapy with cardiologist at follow-up.   Follow-up: Return in about 2 months (around 07/04/2024) for chronic, fasting, lab visit.   I,Angela Taylor,acting as a Neurosurgeon for Harrie CHRISTELLA Cedar, FNP.,have documented all relevant documentation on the behalf of Harrie CHRISTELLA Cedar, FNP,as directed by  Harrie CHRISTELLA Cedar, FNP while in the presence of Harrie CHRISTELLA Cedar, FNP.   An After Visit Summary was printed and given to the patient.  I attest that I have reviewed this visit and agree with the plan scribed by my staff.   Harrie CHRISTELLA Cedar, FNP [name] [phone]

## 2024-04-29 NOTE — Assessment & Plan Note (Signed)
 BMI 37.56, not at goal Comorbidities - Hypertension, Dyslipidemia, OSA, and coronary atherosclerosis Dietary and lifestyle modifications including blood pressure, weight loss behavior modifications and increase physical activity for more than 6 months patient has not been able to lose 5% of the body.  Patient was unable to tolerate phentermine .  - Patient will continue lifestyle modification including reducing daily calorie intake, increasing physical activity and behavioral changes.   - Zepbound  not approved by insurance

## 2024-04-29 NOTE — Assessment & Plan Note (Signed)
 Allergic rhinitis Chronic allergic rhinitis with improvement from allergy shots. Experiences sore throat and itchiness post-injection. - Continue allergy shots as scheduled. - Continue Claritin for symptom management.

## 2024-05-05 DIAGNOSIS — J301 Allergic rhinitis due to pollen: Secondary | ICD-10-CM | POA: Diagnosis not present

## 2024-05-09 DIAGNOSIS — J301 Allergic rhinitis due to pollen: Secondary | ICD-10-CM | POA: Diagnosis not present

## 2024-05-16 DIAGNOSIS — F329 Major depressive disorder, single episode, unspecified: Secondary | ICD-10-CM | POA: Diagnosis not present

## 2024-05-16 DIAGNOSIS — Z630 Problems in relationship with spouse or partner: Secondary | ICD-10-CM | POA: Diagnosis not present

## 2024-05-17 DIAGNOSIS — J301 Allergic rhinitis due to pollen: Secondary | ICD-10-CM | POA: Diagnosis not present

## 2024-05-24 DIAGNOSIS — J301 Allergic rhinitis due to pollen: Secondary | ICD-10-CM | POA: Diagnosis not present

## 2024-05-27 DIAGNOSIS — Z630 Problems in relationship with spouse or partner: Secondary | ICD-10-CM | POA: Diagnosis not present

## 2024-06-02 DIAGNOSIS — R400 Somnolence: Secondary | ICD-10-CM | POA: Diagnosis not present

## 2024-06-02 DIAGNOSIS — J4520 Mild intermittent asthma, uncomplicated: Secondary | ICD-10-CM | POA: Diagnosis not present

## 2024-06-02 DIAGNOSIS — G4733 Obstructive sleep apnea (adult) (pediatric): Secondary | ICD-10-CM | POA: Diagnosis not present

## 2024-06-02 DIAGNOSIS — G4761 Periodic limb movement disorder: Secondary | ICD-10-CM | POA: Diagnosis not present

## 2024-06-06 DIAGNOSIS — F329 Major depressive disorder, single episode, unspecified: Secondary | ICD-10-CM | POA: Diagnosis not present

## 2024-06-06 DIAGNOSIS — Z630 Problems in relationship with spouse or partner: Secondary | ICD-10-CM | POA: Diagnosis not present

## 2024-06-07 DIAGNOSIS — J301 Allergic rhinitis due to pollen: Secondary | ICD-10-CM | POA: Diagnosis not present

## 2024-06-13 DIAGNOSIS — Z630 Problems in relationship with spouse or partner: Secondary | ICD-10-CM | POA: Diagnosis not present

## 2024-06-13 DIAGNOSIS — F329 Major depressive disorder, single episode, unspecified: Secondary | ICD-10-CM | POA: Diagnosis not present

## 2024-06-14 DIAGNOSIS — J301 Allergic rhinitis due to pollen: Secondary | ICD-10-CM | POA: Diagnosis not present

## 2024-06-20 DIAGNOSIS — F329 Major depressive disorder, single episode, unspecified: Secondary | ICD-10-CM | POA: Diagnosis not present

## 2024-06-20 DIAGNOSIS — Z630 Problems in relationship with spouse or partner: Secondary | ICD-10-CM | POA: Diagnosis not present

## 2024-06-21 DIAGNOSIS — G4733 Obstructive sleep apnea (adult) (pediatric): Secondary | ICD-10-CM | POA: Diagnosis not present

## 2024-07-01 DIAGNOSIS — J301 Allergic rhinitis due to pollen: Secondary | ICD-10-CM | POA: Diagnosis not present

## 2024-07-04 DIAGNOSIS — F329 Major depressive disorder, single episode, unspecified: Secondary | ICD-10-CM | POA: Diagnosis not present

## 2024-07-04 DIAGNOSIS — Z630 Problems in relationship with spouse or partner: Secondary | ICD-10-CM | POA: Diagnosis not present

## 2024-07-05 ENCOUNTER — Other Ambulatory Visit: Payer: Self-pay | Admitting: Family Medicine

## 2024-07-05 DIAGNOSIS — J301 Allergic rhinitis due to pollen: Secondary | ICD-10-CM | POA: Diagnosis not present

## 2024-07-05 DIAGNOSIS — F988 Other specified behavioral and emotional disorders with onset usually occurring in childhood and adolescence: Secondary | ICD-10-CM

## 2024-07-11 DIAGNOSIS — J301 Allergic rhinitis due to pollen: Secondary | ICD-10-CM | POA: Diagnosis not present

## 2024-07-18 DIAGNOSIS — F329 Major depressive disorder, single episode, unspecified: Secondary | ICD-10-CM | POA: Diagnosis not present

## 2024-07-18 DIAGNOSIS — Z630 Problems in relationship with spouse or partner: Secondary | ICD-10-CM | POA: Diagnosis not present

## 2024-07-27 DIAGNOSIS — J301 Allergic rhinitis due to pollen: Secondary | ICD-10-CM | POA: Diagnosis not present

## 2024-08-04 DIAGNOSIS — Z630 Problems in relationship with spouse or partner: Secondary | ICD-10-CM | POA: Diagnosis not present

## 2024-08-04 DIAGNOSIS — F329 Major depressive disorder, single episode, unspecified: Secondary | ICD-10-CM | POA: Diagnosis not present

## 2024-08-22 ENCOUNTER — Other Ambulatory Visit: Payer: Self-pay | Admitting: Family Medicine

## 2024-08-22 DIAGNOSIS — I10 Essential (primary) hypertension: Secondary | ICD-10-CM

## 2024-08-23 ENCOUNTER — Other Ambulatory Visit: Payer: Self-pay

## 2024-08-23 DIAGNOSIS — F988 Other specified behavioral and emotional disorders with onset usually occurring in childhood and adolescence: Secondary | ICD-10-CM

## 2024-08-23 MED ORDER — METHYLPHENIDATE HCL ER 36 MG PO TB24: 36.0000 mg | ORAL_TABLET | Freq: Every day | ORAL | 0 refills | Status: AC

## 2024-08-31 DIAGNOSIS — J302 Other seasonal allergic rhinitis: Secondary | ICD-10-CM | POA: Insufficient documentation

## 2024-09-06 ENCOUNTER — Ambulatory Visit

## 2024-09-06 VITALS — BP 112/76 | HR 94 | Ht 70.0 in | Wt 273.0 lb

## 2024-09-06 DIAGNOSIS — I2510 Atherosclerotic heart disease of native coronary artery without angina pectoris: Secondary | ICD-10-CM

## 2024-09-06 DIAGNOSIS — I10 Essential (primary) hypertension: Secondary | ICD-10-CM

## 2024-09-06 DIAGNOSIS — E782 Mixed hyperlipidemia: Secondary | ICD-10-CM

## 2024-09-06 NOTE — Assessment & Plan Note (Signed)
 Well-controlled numbers on lipid panel from August 2025.  He has not been able to exercise regularly. Diet is not the best at this time.  Rosuvastatin  was lowered to 20 mg once daily in August after his lipid panel results showed improvement.  Will recheck lipid panel fasting if numbers show consistent improvement with LDL less than 70 mg/dL okay to continue at the current dose. If LDL has crept up, I would recommend going back to a higher dose of Crestor  40 mg once daily.

## 2024-09-06 NOTE — Assessment & Plan Note (Signed)
Well-controlled.  Continue lisinopril hydrochlorothiazide 20/25 mg once daily

## 2024-09-06 NOTE — Assessment & Plan Note (Signed)
 With minimal coronary atherosclerosis and low age, continue with healthy dietary lifestyle and lipid-lowering therapy for overall risk reduction. Remains asymptomatic. Okay to hold off on aspirin for now.

## 2024-09-06 NOTE — Progress Notes (Signed)
 "  Cardiology Consultation:    Date:  09/06/2024   ID:  Brett Roberts, DOB 1980-11-10, MRN [id number]  PCP:  Teressa Harrie HERO, FNP  Cardiologist:  Alean SAUNDERS Andrick Rust, MD   Referring MD: Teressa Harrie HERO, FNP   No chief complaint on file.    ASSESSMENT AND PLAN:   Brett Roberts 44 year old male   history of minimal coronary atherosclerosis calcium  score 11.3 on CT coronary angiogram 07/22/2023 and CAD RADS 1 study with less than 25% stenosis in mid LAD and proximal RCA, hypertension, obesity, anxiety, stress at work, obstructive sleep apnea-uses CPAP, right middle finger tenosynovitis, hyperlipidemia, GERD, normal biventricular function without any significant valve abnormalities on echocardiogram December 2024   Here for routine follow-up visit Problem List Items Addressed This Visit       Cardiovascular and Mediastinum   Essential hypertension   Well-controlled. Continue lisinopril -hydrochlorothiazide  20-25 mg once daily.       Relevant Medications   rosuvastatin  (CRESTOR ) 40 MG tablet   Coronary atherosclerosis, calcium  score 11.3, CAD RADS 1, less than 25% stenosis of LAD and RCA   With minimal coronary atherosclerosis and low age, continue with healthy dietary lifestyle and lipid-lowering therapy for overall risk reduction. Remains asymptomatic. Okay to hold off on aspirin for now.      Relevant Medications   rosuvastatin  (CRESTOR ) 40 MG tablet     Other   Mixed hyperlipidemia - Primary   Well-controlled numbers on lipid panel from August 2025.  He has not been able to exercise regularly. Diet is not the best at this time.  Rosuvastatin  was lowered to 20 mg once daily in August after his lipid panel results showed improvement.  Will recheck lipid panel fasting if numbers show consistent improvement with LDL less than 70 mg/dL okay to continue at the current dose. If LDL has crept up, I would recommend going back to a higher dose of Crestor  40 mg once daily.        Relevant Medications   rosuvastatin  (CRESTOR ) 40 MG tablet   Other Relevant Orders   EKG 12-Lead (Completed)   Lipid Profile   Comp Met (CMET)   Recommended aggressive diet and lifestyle modifications to target weight loss.  Return to clinic for follow-up with us  tentatively in 1 year   History of Present Illness:    Brett Roberts is a 44 y.o. male who is being seen today for follow-up visit. PCP is Teressa Harrie HERO, FNP. Last visit with me was 09/08/2023.  Here for the visit today by himself.  Continues to work at [employer] works facility in [employer].  Works 3 days a week.  Lives with his wife and 2 kids ages 57 and 71.  Has a history of minimal coronary atherosclerosis calcium  score 11.3 on CT coronary angiogram 07/22/2023 and CAD RADS 1 study with less than 25% stenosis in mid LAD and proximal RCA, hypertension, obesity, anxiety, stress at work, obstructive sleep apnea, right middle finger tenosynovitis, hyperlipidemia, GERD, normal biventricular function without any significant valve abnormalities on echocardiogram December 2024   Mentions overall he has been doing well. No active symptoms.  Occasionally may feel a twinge like sensation in the chest that subsides quickly.  No regular exercise. Has been gaining weight, up by 20 pounds since his last visit.  Tolerating his medications well. Blood pressures have been well-controlled. With cholesterol numbers improved in August Crestor  dose was lowered to 20 mg once daily by PCP.  EKG  in the clinic today shows sinus rhythm heart rate 94/min, PR interval 172 ms, QRS duration 84 ms.  No ischemic changes.  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   Atypical chest pain 01/07/2021   Chest pain 01/07/2021   Chronic fatigue 04/01/2024   Class 2 severe obesity due to excess calories with serious comorbidity and body mass index (BMI) of 36.0 to 36.9 in adult 10/18/2022   Coronary atherosclerosis, calcium  score 11.3, CAD RADS 1, less than 25% stenosis of LAD and RCA 07/31/2023   Essential hypertension 01/07/2021   Family history of premature CAD 01/07/2021   Gastroesophageal reflux disease without esophagitis 07/03/2023   Headache above the eye region 05/13/2023   Hypertension    Influenza vaccine administered 04/29/2024   Mixed hyperlipidemia 10/18/2022   Morbid obesity (HCC) 05/02/2023   OSA (obstructive sleep apnea) 01/07/2021   Seasonal allergies    Trigger middle finger 11/19/2020   Vitamin D  deficiency 05/02/2023    Past Surgical History:  Procedure Laterality Date   TRIGGER FINGER RELEASE Right 07/01/2023   Procedure: RELEASE MIDDLE TRIGGER FINGER/A-1 PULLEY;  Surgeon: Romona Harari, MD;  Location: [facility];  Service: Orthopedics;  Laterality: Right;  local 30    Current Medications: Active Medications[1]   Allergies:   Patient has no known allergies.   Social History   Socioeconomic History   Marital status: Married    Spouse name: Not on file   Number of children: Not on file   Years of education: Not on file   Highest education level: Not on file  Occupational History   Not on file  Tobacco Use   Smoking status: Never   Smokeless tobacco: Never  Vaping Use   Vaping status: Never Used  Substance and Sexual Activity   Alcohol use: Yes    Alcohol/week: 1.0 - 2.0 standard drink of alcohol    Types: 1 - 2 Cans of beer per week    Comment: twice weekly    Drug use: Never   Sexual activity: Yes  Other Topics Concern   Not on file  Social History Narrative   ** Merged History Encounter **       Social Drivers of Health   Tobacco Use: Low Risk  (09/06/2024)   Patient History    Smoking Tobacco Use: Never    Smokeless Tobacco Use: Never    Passive Exposure: Not on file  Financial Resource Strain: Low Risk (01/21/2023)   Overall Financial Resource Strain (CARDIA)    Difficulty of Paying Living Expenses: Not hard at all  Food Insecurity: No Food Insecurity (01/21/2023)   Hunger Vital Sign    Worried About Running Out of Food in the Last Year: Never true    Ran Out of Food in the Last Year: Never true  Transportation Needs: No Transportation Needs (01/21/2023)   PRAPARE - Administrator, Civil Service (Medical): No    Lack of Transportation (Non-Medical): No  Physical Activity: Insufficiently Active (01/21/2023)   Exercise Vital Sign    Days of Exercise per Week: 2 days    Minutes of Exercise per Session: 60 min  Stress: No Stress Concern Present (  01/21/2023)   [employer] - Occupational Stress Questionnaire    Feeling of Stress : Not at all  Social Connections: Moderately Isolated (01/21/2023)   Social Connection and Isolation Panel    Frequency of Communication with Friends and Family: Three times a week    Frequency of Social Gatherings with Friends and Family: Three times a week    Attends Religious Services: Never    Active Member of Clubs or Organizations: No    Attends Banker Meetings: Never    Marital Status: Married  Depression (PHQ2-9): Medium Risk (04/01/2024)   Depression (PHQ2-9)    PHQ-2 Score: 5  Alcohol Screen: Low Risk (09/30/2021)   Alcohol Screen    Last Alcohol Screening Score (AUDIT): 0  Housing: Low Risk (01/21/2023)   Housing    Last Housing Risk Score: 0  Utilities: Not At Risk (01/21/2023)   AHC Utilities    Threatened with loss of utilities: No  Health Literacy: Adequate Health Literacy (04/14/2023)   B1300 Health Literacy    Frequency of need for help with medical instructions: Never     Family History: The patient's family history includes  Allergic rhinitis in an other family member; Angioedema in an other family member; Asthma in an other family member; Atopy in an other family member; Eczema in an other family member; Heart attack in his father; Hyperlipidemia in his brother; Hypertension in his mother and sister; Immunodeficiency in an other family member; Urticaria in an other family member. ROS:   Please see the history of present illness.    All 14 point review of systems negative except as described per history of present illness.  EKGs/Labs/Other Studies Reviewed:    The following studies were reviewed today:   EKG:  EKG Interpretation Date/Time:  Tuesday September 06 2024 08:26:33 EST Ventricular Rate:  94 PR Interval:  172 QRS Duration:  84 QT Interval:  336 QTC Calculation: 420 R Axis:   60  Text Interpretation: Normal sinus rhythm When compared with ECG of 09-Jul-2023 09:14, No significant change was found Confirmed by Liborio Hai reddy [phone]) on 09/06/2024 8:36:49 AM    Recent Labs: 04/01/2024: ALT 23; BUN 20; Creatinine, Ser 1.19; Hemoglobin 13.6; Platelets 242; Potassium 4.7; Sodium 140  Recent Lipid Panel    Component Value Date/Time   CHOL 118 04/01/2024 0805   TRIG 57 04/01/2024 0805   HDL 44 04/01/2024 0805   CHOLHDL 2.7 04/01/2024 0805   LDLCALC 61 04/01/2024 0805    Physical Exam:    VS:  BP 112/76   Pulse 94   Ht 5' 10 (1.778 m)   Wt 273 lb (123.8 kg)   SpO2 96%   BMI 39.17 kg/m     Wt Readings from Last 3 Encounters:  09/06/24 273 lb (123.8 kg)  04/29/24 263 lb (119.3 kg)  04/01/24 261 lb 12.8 oz (118.8 kg)     GENERAL:  Well nourished, well developed in no acute distress CARDIAC: RRR, S1 and S2 present, no murmurs, no rubs, no gallops NEUROLOGIC:  Alert and oriented x 3  Medication Adjustments/Labs and Tests Ordered: Current medicines are reviewed at length with the patient today.  Concerns regarding medicines are outlined above.  Orders Placed This Encounter   Procedures   Lipid Profile   Comp Met (CMET)   EKG 12-Lead   No orders of the defined types were placed in this encounter.   Signed, Hai jess Liborio, MD, MPH, [facility]. 09/06/2024 8:55 AM  [facility]     [1]  Current Meds  Medication Sig   albuterol  (VENTOLIN  HFA) 108 (90 Base) MCG/ACT inhaler Inhale 2 puffs into the lungs every 4 (four) hours as needed.   EPINEPHrine 0.3 mg/0.3 mL IJ SOAJ injection Inject 0.3 mg into the muscle as needed.   ibuprofen  (ADVIL ) 200 MG tablet Take 200 mg by mouth every 6 (six) hours as needed for headache, mild pain (pain score 1-3) or moderate pain (pain score 4-6).   lisinopril -hydrochlorothiazide  (ZESTORETIC ) 20-25 MG tablet Take 1 tablet by mouth daily.   loratadine (CLARITIN) 10 MG tablet Take 10 mg by mouth daily.   methylphenidate  36 MG PO CR tablet Take 1 tablet (36 mg total) by mouth daily.   rosuvastatin  (CRESTOR ) 40 MG tablet Take 20 mg by mouth daily.   "

## 2024-09-06 NOTE — Patient Instructions (Signed)
 Medication Instructions:  Your physician recommends that you continue on your current medications as directed. Please refer to the Current Medication list given to you today.  *If you need a refill on your cardiac medications before your next appointment, please call your pharmacy*  Lab Work: Your physician recommends that you return for lab work in:   Labs today: Lipid, CMP  If you have labs (blood work) drawn today and your tests are completely normal, you will receive your results only by: MyChart Message (if you have MyChart) OR A paper copy in the mail If you have any lab test that is abnormal or we need to change your treatment, we will call you to review the results.  Testing/Procedures: None  Follow-Up: At [facility], you and your health needs are our priority.  As part of our continuing mission to provide you with exceptional heart care, our providers are all part of one team.  This team includes your primary Cardiologist (physician) and Advanced Practice Providers or APPs (Physician Assistants and Nurse Practitioners) who all work together to provide you with the care you need, when you need it.  Your next appointment:   1 year(s)  Provider:   Alean Kobus, MD    We recommend signing up for the patient portal called MyChart.  Sign up information is provided on this After Visit Summary.  MyChart is used to connect with patients for Virtual Visits (Telemedicine).  Patients are able to view lab/test results, encounter notes, upcoming appointments, etc.  Non-urgent messages can be sent to your provider as well.   To learn more about what you can do with MyChart, go to [email or web address].   Other Instructions None

## 2024-09-10 LAB — COMPREHENSIVE METABOLIC PANEL WITH GFR
ALT: 24 IU/L (ref 0–44)
AST: 25 IU/L (ref 0–40)
Albumin: 4.4 g/dL (ref 4.1–5.1)
Alkaline Phosphatase: 74 IU/L (ref 47–123)
BUN/Creatinine Ratio: 14 (ref 9–20)
BUN: 18 mg/dL (ref 6–24)
Bilirubin Total: 0.5 mg/dL (ref 0.0–1.2)
CO2: 23 mmol/L (ref 20–29)
Calcium: 8.8 mg/dL (ref 8.7–10.2)
Chloride: 101 mmol/L (ref 96–106)
Creatinine, Ser: 1.32 mg/dL — ABNORMAL HIGH (ref 0.76–1.27)
Globulin, Total: 2.6 g/dL (ref 1.5–4.5)
Glucose: 90 mg/dL (ref 70–99)
Potassium: 4.3 mmol/L (ref 3.5–5.2)
Sodium: 139 mmol/L (ref 134–144)
Total Protein: 7 g/dL (ref 6.0–8.5)
eGFR: 68 mL/min/1.73

## 2024-09-10 LAB — LIPID PANEL
Chol/HDL Ratio: 3.4 ratio (ref 0.0–5.0)
Cholesterol, Total: 123 mg/dL (ref 100–199)
HDL: 36 mg/dL — ABNORMAL LOW
LDL Chol Calc (NIH): 72 mg/dL (ref 0–99)
Triglycerides: 75 mg/dL (ref 0–149)
VLDL Cholesterol Cal: 15 mg/dL (ref 5–40)
